# Patient Record
Sex: Female | Born: 1950 | Race: White | Hispanic: No | Marital: Married | Smoking: Never smoker
Health system: Southern US, Community
[De-identification: ages and names within clinical notes are randomized; demographics above are authoritative.]

## PROBLEM LIST (undated history)

## (undated) DIAGNOSIS — E039 Hypothyroidism, unspecified: Secondary | ICD-10-CM

## (undated) DIAGNOSIS — L439 Lichen planus, unspecified: Secondary | ICD-10-CM

## (undated) DIAGNOSIS — K069 Disorder of gingiva and edentulous alveolar ridge, unspecified: Secondary | ICD-10-CM

## (undated) DIAGNOSIS — M1990 Unspecified osteoarthritis, unspecified site: Secondary | ICD-10-CM

## (undated) DIAGNOSIS — I2510 Atherosclerotic heart disease of native coronary artery without angina pectoris: Secondary | ICD-10-CM

## (undated) DIAGNOSIS — I219 Acute myocardial infarction, unspecified: Secondary | ICD-10-CM

## (undated) DIAGNOSIS — R7303 Prediabetes: Secondary | ICD-10-CM

## (undated) DIAGNOSIS — T8859XA Other complications of anesthesia, initial encounter: Secondary | ICD-10-CM

## (undated) DIAGNOSIS — I10 Essential (primary) hypertension: Secondary | ICD-10-CM

## (undated) DIAGNOSIS — I6529 Occlusion and stenosis of unspecified carotid artery: Secondary | ICD-10-CM

## (undated) DIAGNOSIS — E079 Disorder of thyroid, unspecified: Secondary | ICD-10-CM

## (undated) HISTORY — PX: ABDOMINAL HYSTERECTOMY: SHX81

## (undated) HISTORY — PX: CARDIAC CATHETERIZATION: SHX172

## (undated) HISTORY — PX: OVARIAN CYST REMOVAL: SHX89

## (undated) HISTORY — PX: THYROIDECTOMY, PARTIAL: SHX18

## (undated) HISTORY — DX: Lichen planus, unspecified: L43.9

## (undated) HISTORY — PX: CATARACT EXTRACTION, BILATERAL: SHX1313

## (undated) HISTORY — PX: LASIK: SHX215

## (undated) HISTORY — DX: Atherosclerotic heart disease of native coronary artery without angina pectoris: I25.10

## (undated) HISTORY — PX: APPENDECTOMY: SHX54

## (undated) HISTORY — DX: Disorder of gingiva and edentulous alveolar ridge, unspecified: K06.9

## (undated) HISTORY — PX: GUM SURGERY: SHX658

## (undated) HISTORY — PX: ROTATOR CUFF REPAIR W/ DISTAL CLAVICLE EXCISION: SHX2365

## (undated) HISTORY — PX: TUBAL LIGATION: SHX77

## (undated) HISTORY — PX: WRIST SURGERY: SHX841

## (undated) HISTORY — DX: Occlusion and stenosis of unspecified carotid artery: I65.29

## (undated) HISTORY — DX: Disorder of thyroid, unspecified: E07.9

## (undated) HISTORY — PX: KNEE ARTHROSCOPY: SHX127

## (undated) HISTORY — PX: ROTATOR CUFF REPAIR: SHX139

## (undated) HISTORY — PX: BILATERAL OOPHORECTOMY: SHX1221

---

## 1975-07-18 HISTORY — PX: APPENDECTOMY: SHX54

## 1980-07-17 HISTORY — PX: ROTATOR CUFF REPAIR: SHX139

## 1989-07-17 HISTORY — PX: ABDOMINAL HYSTERECTOMY: SHX81

## 1989-07-17 HISTORY — PX: BILATERAL OOPHORECTOMY: SHX1221

## 2007-12-12 HISTORY — PX: THYROID SURGERY: SHX805

## 2017-08-02 LAB — HM MAMMOGRAPHY

## 2017-11-08 HISTORY — PX: KNEE ARTHROSCOPY: SHX127

## 2018-07-05 HISTORY — PX: ROTATOR CUFF REPAIR W/ DISTAL CLAVICLE EXCISION: SHX2365

## 2019-02-11 LAB — HM DEXA SCAN

## 2019-02-13 LAB — HM MAMMOGRAPHY

## 2019-11-05 LAB — HM PAP SMEAR: HM Pap smear: NEGATIVE

## 2020-06-19 ENCOUNTER — Ambulatory Visit
Admission: EM | Admit: 2020-06-19 | Discharge: 2020-06-19 | Disposition: A | Discharge status: Home / Self Care | Payer: Medicare Other

## 2020-06-19 ENCOUNTER — Other Ambulatory Visit: Payer: Self-pay

## 2020-06-19 DIAGNOSIS — J3489 Other specified disorders of nose and nasal sinuses: Secondary | ICD-10-CM

## 2020-06-19 DIAGNOSIS — J019 Acute sinusitis, unspecified: Secondary | ICD-10-CM

## 2020-06-19 DIAGNOSIS — R059 Cough, unspecified: Secondary | ICD-10-CM

## 2020-06-19 MED ORDER — AMOXICILLIN 500 MG PO CAPS: 500.0000 mg | ORAL_CAPSULE | Freq: Three times a day (TID) | ORAL | 0 refills | Status: DC

## 2020-06-19 MED ORDER — CETIRIZINE HCL 10 MG PO TABS: 10.0000 mg | ORAL_TABLET | Freq: Every day | ORAL | 0 refills | Status: DC

## 2020-06-19 MED ORDER — PREDNISONE 20 MG PO TABS: ORAL_TABLET | ORAL | 0 refills | Status: DC

## 2020-06-19 NOTE — ED Provider Notes (Signed)
[place]   MRN: [id number] DOB: 07-19-1950  Subjective:   Samantha Perkins is a 69 y.o. female presenting for 2-week history of persistent sinus pressure and sinus drainage, coughing.  Patient states that symptoms started when she was packing and trying to move from their house and [place], had a lot of exposure to dust.  Denies chest pain, shortness of breath, body aches.  Patient have had both Covid vaccinations and the booster shot.  History of heart disease, hypothyroidism.  No history of heart failure, diabetes.  No history of asthma, smoking history.  No current facility-administered medications for this encounter.  Current Outpatient Medications:  .  aspirin EC 81 MG tablet, Take 81 mg by mouth daily. Swallow whole., Disp: , Rfl:  .  clopidogrel (PLAVIX) 75 MG tablet, Take 75 mg by mouth daily., Disp: , Rfl:  .  estradiol (ESTRACE) 1 MG tablet, Take 1 mg by mouth daily., Disp: , Rfl:  .  levothyroxine (SYNTHROID) 88 MCG tablet, Take 88 mcg by mouth daily before breakfast., Disp: , Rfl:  .  liothyronine (CYTOMEL) 5 MCG tablet, Take 5 mcg by mouth daily., Disp: , Rfl:  .  metoprolol succinate (TOPROL-XL) 25 MG 24 hr tablet, Take 25 mg by mouth daily., Disp: , Rfl:    Allergies  Allergen Reactions  . Cymbalta [Duloxetine Hcl] Rash    History reviewed. No pertinent past medical history.   History reviewed. No pertinent surgical history.  History reviewed. No pertinent family history.  Social History   Tobacco Use  . Smoking status: Never Smoker  . Smokeless tobacco: Never Used  Vaping Use  . Vaping Use: Never used  Substance Use Topics  . Alcohol use: Yes    Comment: occasionally  . Drug use: Never    ROS   Objective:   Vitals: BP (!) 144/75 (BP Location: Left Arm)   Pulse 83   Temp 97.8 F (36.6 C) (Oral)   Resp 20   SpO2 95%   Physical Exam Constitutional:      General: She is not in acute distress.    Appearance: Normal  appearance. She is well-developed. She is not ill-appearing, toxic-appearing or diaphoretic.  HENT:     Head: Normocephalic and atraumatic.     Right Ear: External ear normal.     Left Ear: External ear normal.     Nose: Congestion present.     Mouth/Throat:     Mouth: Mucous membranes are moist.     Pharynx: No oropharyngeal exudate or posterior oropharyngeal erythema.     Comments: Significant postnasal drainage overlying pharynx. Eyes:     General: No scleral icterus.       Right eye: No discharge.        Left eye: No discharge.     Extraocular Movements: Extraocular movements intact.     Conjunctiva/sclera: Conjunctivae normal.     Pupils: Pupils are equal, round, and reactive to light.  Cardiovascular:     Rate and Rhythm: Normal rate and regular rhythm.     Pulses: Normal pulses.     Heart sounds: Normal heart sounds. No murmur heard.  No friction rub. No gallop.   Pulmonary:     Effort: Pulmonary effort is normal. No respiratory distress.     Breath sounds: Normal breath sounds. No stridor. No wheezing, rhonchi or rales.  Skin:    General: Skin is warm and dry.     Findings: No rash.  Neurological:  Mental Status: She is alert and oriented to person, place, and time.  Psychiatric:        Mood and Affect: Mood normal.        Behavior: Behavior normal.        Thought Content: Thought content normal.        Judgment: Judgment normal.     Assessment and Plan :   PDMP not reviewed this encounter.  1. Acute non-recurrent sinusitis, unspecified location   2. Sinus drainage   3. Sinus pain   4. Cough     Will start empiric treatment for sinusitis with amoxicillin.  Patient requested more aggressive management and therefore I was agreeable to using prednisone course especially since patient has been tested for allergies and has significant trouble with this.  Also recommended she start Zyrtec daily especially since she lives here [place] now.  Recommended  supportive care otherwise. Counseled patient on potential for adverse effects with medications prescribed/recommended today, ER and return-to-clinic precautions discussed, patient verbalized understanding.    Jaynee Eagles, [place] 06/19/20 [phone]

## 2020-06-19 NOTE — ED Triage Notes (Signed)
Patient complains of cough and sinus pressure/drainage x 2 weeks and states it is probably due to currently packing and moving from their house in [place] and all the dust exposure. Pt is aox4 and ambulatory and complains of no fever.

## 2020-07-26 ENCOUNTER — Encounter: Payer: Self-pay | Admitting: Family Medicine

## 2020-07-26 ENCOUNTER — Other Ambulatory Visit: Payer: Self-pay

## 2020-07-26 ENCOUNTER — Ambulatory Visit (INDEPENDENT_AMBULATORY_CARE_PROVIDER_SITE_OTHER): Payer: Medicare HMO | Admitting: Family Medicine

## 2020-07-26 DIAGNOSIS — M8580 Other specified disorders of bone density and structure, unspecified site: Secondary | ICD-10-CM | POA: Insufficient documentation

## 2020-07-26 DIAGNOSIS — E041 Nontoxic single thyroid nodule: Secondary | ICD-10-CM | POA: Diagnosis not present

## 2020-07-26 DIAGNOSIS — I252 Old myocardial infarction: Secondary | ICD-10-CM | POA: Insufficient documentation

## 2020-07-26 DIAGNOSIS — E063 Autoimmune thyroiditis: Secondary | ICD-10-CM | POA: Diagnosis not present

## 2020-07-26 DIAGNOSIS — M79672 Pain in left foot: Secondary | ICD-10-CM | POA: Insufficient documentation

## 2020-07-26 DIAGNOSIS — E785 Hyperlipidemia, unspecified: Secondary | ICD-10-CM | POA: Insufficient documentation

## 2020-07-26 DIAGNOSIS — E038 Other specified hypothyroidism: Secondary | ICD-10-CM | POA: Diagnosis not present

## 2020-07-26 DIAGNOSIS — E782 Mixed hyperlipidemia: Secondary | ICD-10-CM | POA: Diagnosis not present

## 2020-07-26 MED ORDER — ESTRADIOL 0.5 MG PO TABS: 0.5000 mg | ORAL_TABLET | Freq: Every day | ORAL | 1 refills | Status: DC

## 2020-07-26 NOTE — Progress Notes (Signed)
Subjective:     Samantha Perkins is a 70 y.o. female presenting for Establish Care and Referral (Podiatry, cardiology (Dr. Audie Box))     HPI   #osteopenia - several fractures on her feet - takes prolia and is due for her upcoming shots - February 14 is when it is due  #hashimoto's thyroiditis  - taking cytomel and synthroid - difficulty sleeping and was waking up so he started the cytomel to see if that would help    Review of Systems   Social History   Tobacco Use  Smoking Status Never Smoker  Smokeless Tobacco Never Used        Objective:    BP Readings from Last 3 Encounters:  07/26/20 112/62  06/19/20 (!) 144/75   Wt Readings from Last 3 Encounters:  07/26/20 200 lb 8 oz (90.9 kg)    BP 112/62   Pulse 69   Temp 97.7 F (36.5 C) (Temporal)   Ht 5' 1.75" (1.568 m)   Wt 200 lb 8 oz (90.9 kg)   SpO2 96%   BMI 36.97 kg/m    Physical Exam Constitutional:      General: She is not in acute distress.    Appearance: She is well-developed. She is not diaphoretic.  HENT:     Right Ear: External ear normal.     Left Ear: External ear normal.     Nose: Nose normal.  Eyes:     Conjunctiva/sclera: Conjunctivae normal.  Cardiovascular:     Rate and Rhythm: Normal rate and regular rhythm.     Heart sounds: No murmur heard.   Pulmonary:     Effort: Pulmonary effort is normal. No respiratory distress.     Breath sounds: Normal breath sounds. No wheezing.  Musculoskeletal:     Cervical back: Neck supple.  Skin:    General: Skin is warm and dry.     Capillary Refill: Capillary refill takes less than 2 seconds.  Neurological:     Mental Status: She is alert. Mental status is at baseline.  Psychiatric:        Mood and Affect: Mood normal.        Behavior: Behavior normal.           Assessment & Plan:   Problem List Items Addressed This Visit      Endocrine   Thyroid nodule    S/p partial thyroidectomy with new nodules. Endo referral       Relevant Orders   TSH   Ambulatory referral to Endocrinology   Hypothyroidism due to Hashimoto's thyroiditis    Currently stable. Started cytomel to help with sleep w/o much improvement. Cont Liothyronine 5 mg and levo 88 mcg. Referral to endocrinology given nodules.       Relevant Orders   TSH   Ambulatory referral to Endocrinology     Musculoskeletal and Integument   Osteopenia    On prolia due to recurrent fractures. Labs today. Cont prolia        Other   History of MI (myocardial infarction)    Cont metoprolol 25 mg, asa 81 mg, and atorvastatin 10 mg      Relevant Orders   Ambulatory referral to Cardiology   Comprehensive metabolic panel   CBC   Hyperlipidemia    Cont lipitor 10 mg      Relevant Medications   atorvastatin (LIPITOR) 10 MG tablet   nitroGLYCERIN (NITROSTAT) 0.4 MG SL tablet   Other Relevant Orders   Ambulatory  referral to Cardiology   Lipid panel   Left foot pain   Relevant Orders   Ambulatory referral to Podiatry       Return in about 6 weeks (around 09/06/2020).  Lesleigh Noe, MD  This visit occurred during the SARS-CoV-2 public health emergency.  Safety protocols were in place, including screening questions prior to the visit, additional usage of staff PPE, and extensive cleaning of exam room while observing appropriate contact time as indicated for disinfecting solutions.

## 2020-07-26 NOTE — Assessment & Plan Note (Signed)
Cont metoprolol 25 mg, asa 81 mg, and atorvastatin 10 mg

## 2020-07-26 NOTE — Assessment & Plan Note (Signed)
Currently stable. Started cytomel to help with sleep w/o much improvement. Cont Liothyronine 5 mg and levo 88 mcg. Referral to endocrinology given nodules.

## 2020-07-26 NOTE — Assessment & Plan Note (Signed)
S/p partial thyroidectomy with new nodules. Endo referral

## 2020-07-26 NOTE — Assessment & Plan Note (Addendum)
On prolia due to recurrent fractures. Labs today. Cont prolia

## 2020-07-26 NOTE — Assessment & Plan Note (Signed)
Cont lipitor 10 mg 

## 2020-07-26 NOTE — Patient Instructions (Addendum)
Estrogen - Decrease dose - new tablet is 0.5 mg - if after 2-3 days miserable - try taking 1.5 tablets (0.75 mg)     #Referral I have placed a referral to a specialist for you. You should receive a phone call from the specialty office. Make sure your voicemail is not full and that if you are able to answer your phone to unknown or new numbers.   It may take up to 2 weeks to hear about the referral. If you do not hear anything in 2 weeks, please call our office and ask to speak with the referral coordinator.   - Endocrinology - podiatry  - cardiology   Please call the location of your choice from the menu below to schedule your Mammogram and/or Bone Density appointment.    [place]   1. [facility]                      Phone:  [phone], [place] [address]                                                             Services: Traditional and 3D Mammogram, Bone Density   2. [facility]                 Phone: [phone] [address], [place] [address]    Service: Bone Density ONLY   *this site does NOT perform mammograms  3. [place]                        Phone:  [phone] [address] [place],  [address]                                            Services:  3D Mammogram and Bone Density    [place]  1. [place] [facility]   Phone:  [phone]   [address]  [place], [place] [address]                                            Services: 3D Mammogram and Bone Density  2. [place] at [facility])  Phone:  [phone]   [address] Room [place], [place] [address]                                              Services:  3D Mammogram and Bone Density

## 2020-07-27 LAB — COMPREHENSIVE METABOLIC PANEL
ALT: 14 U/L (ref 0–35)
AST: 18 U/L (ref 0–37)
Albumin: 4.3 g/dL (ref 3.5–5.2)
Alkaline Phosphatase: 44 U/L (ref 39–117)
BUN: 16 mg/dL (ref 6–23)
CO2: 28 mEq/L (ref 19–32)
Calcium: 9.7 mg/dL (ref 8.4–10.5)
Chloride: 103 mEq/L (ref 96–112)
Creatinine, Ser: 1.01 mg/dL (ref 0.40–1.20)
GFR: 56.94 mL/min — ABNORMAL LOW (ref >60.00)
Glucose, Bld: 93 mg/dL (ref 70–99)
Potassium: 4.8 mEq/L (ref 3.5–5.1)
Sodium: 138 mEq/L (ref 135–145)
Total Bilirubin: 0.3 mg/dL (ref 0.2–1.2)
Total Protein: 6.7 g/dL (ref 6.0–8.3)

## 2020-07-27 LAB — CBC
HCT: 39 % (ref 36.0–46.0)
Hemoglobin: 12.9 g/dL (ref 12.0–15.0)
MCHC: 33.2 g/dL (ref 30.0–36.0)
MCV: 80.6 fl (ref 78.0–100.0)
Platelets: 210 10*3/uL (ref 150.0–400.0)
RBC: 4.83 Mil/uL (ref 3.87–5.11)
RDW: 14.1 % (ref 11.5–15.5)
WBC: 7.2 10*3/uL (ref 4.0–10.5)

## 2020-07-27 LAB — LIPID PANEL
Cholesterol: 165 mg/dL (ref 0–200)
HDL: 59.3 mg/dL (ref >39.00)
NonHDL: 105.4
Total CHOL/HDL Ratio: 3
Triglycerides: 261 mg/dL — ABNORMAL HIGH (ref 0.0–149.0)
VLDL: 52.2 mg/dL — ABNORMAL HIGH (ref 0.0–40.0)

## 2020-07-27 LAB — LDL CHOLESTEROL, DIRECT: Direct LDL: 73 mg/dL

## 2020-07-27 LAB — TSH: TSH: 0.96 u[IU]/mL (ref 0.35–4.50)

## 2020-08-03 ENCOUNTER — Ambulatory Visit: Payer: Medicare HMO | Admitting: Podiatry

## 2020-08-16 ENCOUNTER — Other Ambulatory Visit: Payer: Self-pay

## 2020-08-16 ENCOUNTER — Ambulatory Visit: Payer: Medicare HMO | Admitting: Podiatry

## 2020-08-16 ENCOUNTER — Ambulatory Visit (INDEPENDENT_AMBULATORY_CARE_PROVIDER_SITE_OTHER): Payer: Medicare HMO

## 2020-08-16 ENCOUNTER — Encounter: Payer: Self-pay | Admitting: Podiatry

## 2020-08-16 DIAGNOSIS — S92351A Displaced fracture of fifth metatarsal bone, right foot, initial encounter for closed fracture: Secondary | ICD-10-CM

## 2020-08-16 DIAGNOSIS — M7672 Peroneal tendinitis, left leg: Secondary | ICD-10-CM | POA: Diagnosis not present

## 2020-08-16 DIAGNOSIS — S92352A Displaced fracture of fifth metatarsal bone, left foot, initial encounter for closed fracture: Secondary | ICD-10-CM | POA: Diagnosis not present

## 2020-08-16 NOTE — Progress Notes (Signed)
  Subjective:  Patient ID: Samantha Perkins, female    DOB: 1950/07/24,  MRN: [id number]  Chief Complaint  Patient presents with  . Foot Pain    Left foot pain on the lateral side. PT stated that for the last 5-6 months the pain has gotten worse.     70 y.o. female presents with the above complaint. History confirmed with patient.  This pain initially began in late May 2021.  She has a history of multiple foot fractures and osteopenia.  She takes Prolia therapy and has injections twice a year.  The pain is in the outside of the foot and is worse when walking.  She recently moved and has been on her feet a lot.  Objective:  Physical Exam: warm, good capillary refill, no trophic changes or ulcerative lesions, normal DP and PT pulses and normal sensory exam. Left Foot: Sharp pain on palpation of the fifth metatarsal base, she also has pain in the retromalleolar area of the peroneal tendons.  5 out of 5 strength.  Pain with resisted eversion.  Radiographs: X-ray of the left foot: Age-indeterminate but appears to be acute avulsion fracture of the fifth metatarsal base Assessment:   1. Closed fracture of base of fifth metatarsal bone of right foot, initial encounter   2. Peroneal tendinitis of left lower extremity      Plan:  Patient was evaluated and treated and all questions answered.   Reviewed the x-rays with the patient and discussed etiology and treatment options for what appears to be an avulsion fracture of the fifth metatarsal base as well as peroneal tendinopathy.  I think we should begin with immobilization in a short CAM boot for at least 2 weeks, she can then transition to a supportive Tri-Lock ankle brace, both of which I dispensed today.  I would like to see her back in 1 month.  If she is improving we will begin rehab exercises for peroneal tendinopathy.  Consider physical therapy and MRI if not improving within 6 to 8 weeks.  Return in about 1 month (around 09/13/2020).

## 2020-08-16 NOTE — Patient Instructions (Addendum)
Wear the boot for 2-4 weeks. If the pain is significantly better in 2 weeks, you can transition to wearing the ankle brace. If it still hurts, remain in the boot.      Peroneal Tendinopathy  Peroneal tendinopathy is irritation of the tendons that pass behind your ankle (peroneal tendons). These tendons attach muscles in your foot to a bone on the side of your foot and underneath the arch of your foot. This condition can cause your peroneal tendons to get bigger and swell. What are the causes? This condition may be caused by:  Putting stress on your ankle over and over again (overuse injury).  A sudden injury that puts stress on your tendons, such as an ankle sprain. What increases the risk? You are more likely to develop this condition if you:  Have high arches.  Play sports that involve putting stress on the ankle over and over again. These sports include: ? Running. ? Dancing. ? Soccer. ? Basketball. What are the signs or symptoms? Symptoms of this condition can start suddenly or develop gradually. Symptoms of this condition include:  Pain in the back of the ankle, on the side of the foot, or in the arch of the foot.  Pain that gets worse with activity and better with rest.  Swelling.  Warmth.  Weakness in your foot or ankle. How is this diagnosed? This condition may be diagnosed based on:  Your symptoms.  Your medical history.  A physical exam. During the exam, your health care provider may move your foot and ankle and test the strength of your leg muscles.  Imaging tests, such as: ? X-rays or a CT scan to check for bone injury. ? MRI or ultrasound to check for muscle or tendon injury. How is this treated? This condition may be treated by:  Keeping your body weight off your ankle for several days.  Returning to full activity gradually.  Putting ice on your ankle to reduce swelling.  Taking NSAIDs, such as ibuprofen.  Having medicine injected into your  tendon to reduce swelling.  Wearing a removable boot or brace for ankle support.  Doing range-of-motion exercises and strengthening exercises (physical therapy) when pain and swelling improve. If the condition does not improve with treatment, or if a tendon or muscle is damaged, surgery may be needed. Follow these instructions at home: If you have a boot or brace:  Wear the boot or brace as told by your health care provider. Remove it only as told by your health care provider.  Loosen the boot or brace if your toes tingle, become numb, or turn cold and blue.  Keep the boot or brace clean.  If the boot or brace is not waterproof: ? Do not let it get wet. ? Cover it with a watertight covering when you take a bath or shower. Managing pain, stiffness, and swelling  If directed, put ice on the injured area. ? If you have a removable boot or brace, remove it as told by your health care provider. ? Put ice in a plastic bag. ? Place a towel between your skin and the bag. ? Leave the ice on for 20 minutes, 2-3 times a day.  Move your toes often to reduce stiffness and swelling.  Raise (elevate) your ankle above the level of your heart while you are sitting or lying down.   Activity  Do not do activities that make pain or swelling worse.  Do exercises as told by your health care  provider.  Return to your normal activities as told by your health care provider. Ask your health care provider what activities are safe for you.  Ask your health care provider when it is safe to drive if you have a boot or brace on your foot. General instructions  Take over-the-counter and prescription medicines only as told by your health care provider.  Do not use any products that contain nicotine or tobacco, such as cigarettes, e-cigarettes, and chewing tobacco. These can delay healing. If you need help quitting, ask your health care provider.  Keep all follow-up visits as told by your health care  provider. This is important. How is this prevented?  Wear supportive footwear that is appropriate for your athletic activity.  Avoid athletic activities that cause swelling or pain in your ankle or foot.  See your health care provider if you have pain or swelling that does not improve after a few days of rest.  Stop training if you develop pain or swelling.  If you start a new athletic activity, start gradually to build up your strength, endurance, and flexibility.  Warm up and stretch before being active.  Cool down and stretch after being active. Contact a health care provider if:  Your symptoms get worse.  Your symptoms do not improve in 2-4 weeks.  You develop new, unexplained symptoms. Summary  Peroneal tendinopathy is irritation of the tendons that pass behind your ankle.  This condition is caused by overuse or sudden injury to the peroneal tendon.  Symptoms include pain, swelling, warmth, and weakness in your foot or ankle.  This condition is treated with rest, ice, medicines, physical therapy, and surgery if needed. This information is not intended to replace advice given to you by your health care provider. Make sure you discuss any questions you have with your health care provider. Document Revised: 10/24/2018 Document Reviewed: 08/12/2018 Elsevier Patient Education  2021 [employer].

## 2020-08-17 ENCOUNTER — Telehealth: Payer: Self-pay

## 2020-08-17 NOTE — Telephone Encounter (Signed)
Pt reports she is a new pt and was receiving prolia and thinks she is due for inj.  Last inj in chart was 02/19/20  Next prolia inj due after 08/22/20.   Pt had labs at [facility] on 07/26/20; Ca normal and CrCl 75.44; all labs ok for prolia inj. Need to run benefits for cost to pt and then inquire if ok with PCP to use labs from 07/26/20. If ok then pt will need to be scheduled for inj but if not ok she will need to have labs and then [place].   Advised pt of process and that benefits and if PA was needed can take up to 2 wks. Advised once that info was obtained this office would f/u. Advised if anything is needed before being contacted to call the clinic. Pt verbalized understanding and voiced that she has been treated so nicely since moving here and becoming a  Pt at this clinic.

## 2020-08-18 NOTE — Telephone Encounter (Signed)
Benefits submitted will not know if needs auth until they return. Let me know if one is needed and I will submit.

## 2020-08-20 ENCOUNTER — Encounter: Payer: Self-pay | Admitting: *Deleted

## 2020-09-05 NOTE — Progress Notes (Signed)
Cardiology Office Note:   Date:  09/06/2020  NAME:  Samantha Perkins    MRN: [id number] DOB:  June 14, 1951   PCP:  Lesleigh Noe, MD  Cardiologist:  No primary care provider on file.   Referring MD: Lesleigh Noe, MD   Chief Complaint  Patient presents with  . Coronary Artery Disease   History of Present Illness:   Samantha Perkins is a 70 y.o. female with a hx of CAD who is being seen today for the evaluation of CAD at the request of Lesleigh Noe, MD.  She recently relocated to [place].  She was living in [place].  She is relocated to be closer to family.  Her nephew was Monica Martinez a Hydrographic surveyor in town.  She had a myocardial infarction in 2007.  There was an occluded distal circumflex that was not amendable to PCI.  She has been managed medically since that time.  She underwent a cardiac PET scan in [place] in September 2021 which showed a small fixed perfusion defect with no evidence of ischemia.  LV function is remain normal.  She denies any chest pain or shortness of breath.  She is working on losing weight.  She suffers from osteopenia.  She has a fracture in her left foot.  She may need surgery.  She is remained on aspirin and Plavix since that time.  She does bruise easily.  We discussed stopping her Plavix.  She will remain on aspirin.  We also discussed her most recent lipid profile.  LDL cholesterol not at goal.  Triglycerides not at goal.  She was nonfasting.  We will recheck this today.  She also had a history of PACs and has been on metoprolol.  Denies any palpitations.  Her EKG demonstrates normal sinus rhythm heart rate 66 with nonspecific ST-T changes.  She is a never smoker.  She rarely drinks alcohol.  No drug use reported.  She is a retired Education officer, community.  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  She is married.  She has 2 step daughters.  She seems to be doing well.   She did have carotid artery stenoses bilaterally.  None were significant.  She needs repeat ultrasounds.  Problem List 1. CAD -Small inferior infarct 01/28/2006 with totally occluded distal circumflex was unsuccessful angioplasty [facility]) -Cardiac PET 04/14/2020 ([place]): Large, fixed perfusion defect of the lateral wall, no ischemia, findings consistent with prior MI, EF 59% 2. HLD -T chol 165, HLD 59, LDL 73, TG 261 3. Hypothyroidism  4. Osteopenia 5.  PACs -Treated with metoprolol 6.  Carotid artery disease -Less than 40% right ICA -40 to 59% left ICA (2020)  Past Medical History: Past Medical History:  Diagnosis Date  . Carotid artery occlusion   . Coronary artery disease    Distal LCX MI 2007 in [place]. PCI unable to be performed.   . Thyroid disease     Past Surgical History: Past Surgical History:  Procedure Laterality Date  . ABDOMINAL HYSTERECTOMY    . APPENDECTOMY    . BILATERAL OOPHORECTOMY    . CARDIAC CATHETERIZATION    . CATARACT EXTRACTION, BILATERAL    . GUM SURGERY    . KNEE ARTHROSCOPY    . LASIK    . OVARIAN CYST REMOVAL    . ROTATOR CUFF REPAIR Right   . ROTATOR CUFF REPAIR W/ DISTAL CLAVICLE EXCISION Left   .  THYROIDECTOMY, PARTIAL    . TUBAL LIGATION      Current Medications: Current Meds  Medication Sig  . aspirin EC 81 MG tablet Take 81 mg by mouth daily. Swallow whole.  Marland Kitchen atorvastatin (LIPITOR) 10 MG tablet Take 10 mg by mouth every other day.  Marland Kitchen azelastine (ASTELIN) 0.1 % nasal spray as needed.  . cetirizine (ZYRTEC ALLERGY) 10 MG tablet Take 1 tablet (10 mg total) by mouth daily. (Patient taking differently: Take 10 mg by mouth as needed.)  . denosumab (PROLIA) 60 MG/ML SOSY injection Inject 60 mg into the skin every 6 (six) months.  . desonide (DESOWEN) 0.05 % cream Apply topically as needed.  Marland Kitchen estradiol (ESTRACE) 0.5 MG tablet Take 1 tablet (0.5 mg total) by mouth daily.  . fluocinonide gel (LIDEX) 0.05  % Apply topically once a week.  . levothyroxine (SYNTHROID) 88 MCG tablet Take 88 mcg by mouth daily before breakfast.  . liothyronine (CYTOMEL) 5 MCG tablet Take 5 mcg by mouth daily.  . Melatonin 5 MG CAPS Take 5 mg by mouth at bedtime.  . metoprolol succinate (TOPROL-XL) 25 MG 24 hr tablet Take 25 mg by mouth daily.  . Multiple Vitamins-Minerals (CULTURELLE PROBIOTICS + MULTIV) CHEW Chew by mouth.  . Multiple Vitamins-Minerals (ONE-A-DAY WOMENS 50+ ADVANTAGE PO) Take by mouth daily.  . nitroGLYCERIN (NITROSTAT) 0.4 MG SL tablet Place 0.4 mg under the tongue as needed.  . nystatin cream (MYCOSTATIN) Apply 1 application topically 3 (three) times daily as needed.  . [DISCONTINUED] clopidogrel (PLAVIX) 75 MG tablet Take 75 mg by mouth daily.     Allergies:    Cymbalta [duloxetine hcl], Duloxetine, Latex, and Wound dressing adhesive   Social History: Social History   Socioeconomic History  . Marital status: Married    Spouse name: Hiram Comber  . Number of children: 0  . Years of education: master's degree  . Highest education level: Not on file  Occupational History  . Occupation: retired Pharmacist, hospital  Tobacco Use  . Smoking status: Never Smoker  . Smokeless tobacco: Never Used  Vaping Use  . Vaping Use: Never used  Substance and Sexual Activity  . Alcohol use: Yes    Comment: twice a week, 1 drink  . Drug use: Never  . Sexual activity: Not Currently    Birth control/protection: Surgical  Other Topics Concern  . Not on file  Social History Narrative   07/26/20   From: [place], moved to be near children   Living: with husband, Hiram Comber [phone])   Work: retired - Lexicographer      Family: step daughter - Product manager - 2 step granddaughters - in the area      Enjoys: Environmental education officer, explore new places, reading, volunteering       Exercise: not currently, joining the [employer]   Diet: tries to eat healthy, not as good as she did before      Safety    Seat belts: Yes    Guns: No   Safe in relationships: Yes    Social Determinants of Radio broadcast assistant Strain: Not on file  Food Insecurity: Not on file  Transportation Needs: Not on file  Physical Activity: Not on file  Stress: Not on file  Social Connections: Not on file     Family History: The patient's family history includes Aneurysm in her mother; Arthritis in her father and sister; Bladder Cancer in her sister; Bradycardia in her sister; COPD in her  father; Cataracts in her sister; Colon cancer in her maternal uncle; Crohn's disease in her sister; Diabetes in her father, maternal grandmother, and sister; Fibroids in her sister; Heart disease in her father; Heart failure in her mother; Heart murmur in her father; Hypertension in her father and sister; Hypothyroidism in her mother; Irritable bowel syndrome in her sister; Liver cancer in her sister; Lung cancer in her mother and sister; Mitral valve prolapse in her mother, sister, and sister; Osteopenia in her sister; Pancreatic cancer in her maternal uncle; Stroke in her mother and sister.  ROS:   All other ROS reviewed and negative. Pertinent positives noted in the HPI.     EKGs/Labs/Other Studies Reviewed:   The following studies were personally reviewed by me today:  EKG:  EKG is ordered today.  The ekg ordered today demonstrates normal sinus rhythm heart rate 66, nonspecific ST-T changes noted, and was personally reviewed by me.   Recent Labs: 07/26/2020: ALT 14; BUN 16; Creatinine, Ser 1.01; Hemoglobin 12.9; Platelets 210.0; Potassium 4.8; Sodium 138; TSH 0.96   Recent Lipid Panel    Component Value Date/Time   CHOL 165 07/26/2020 1610   TRIG 261.0 (H) 07/26/2020 1610   HDL 59.30 07/26/2020 1610   CHOLHDL 3 07/26/2020 1610   VLDL 52.2 (H) 07/26/2020 1610   LDLDIRECT 73.0 07/26/2020 1610    Physical Exam:   VS:  BP 116/70   Pulse 66   Ht 5\' 3"  (1.6 m)   Wt 200 lb (90.7 kg)   SpO2 99%   BMI 35.43 kg/m     Wt Readings from Last 3 Encounters:  09/06/20 200 lb (90.7 kg)  07/26/20 200 lb 8 oz (90.9 kg)    General: Well nourished, well developed, in no acute distress Head: Atraumatic, normal size  Eyes: PEERLA, EOMI  Neck: Supple, no JVD Endocrine: No thryomegaly Cardiac: Normal S1, S2; RRR; no murmurs, rubs, or gallops Lungs: Clear to auscultation bilaterally, no wheezing, rhonchi or rales  Abd: Soft, nontender, no hepatomegaly  Ext: No edema, pulses 2+ Musculoskeletal: No deformities, BUE and BLE strength normal and equal Skin: Warm and dry, no rashes   Neuro: Alert and oriented to person, place, time, and situation, CNII-XII grossly intact, no focal deficits  Psych: Normal mood and affect   ASSESSMENT:   Samantha Perkins is a 70 y.o. female who presents for the following: 1. Coronary artery disease involving native coronary artery of native heart without angina pectoris   2. Mixed hyperlipidemia   3. Bilateral carotid artery stenosis     PLAN:   1. Coronary artery disease involving native coronary artery of native heart without angina pectoris 2. Mixed hyperlipidemia -Small inferior infarct 01/28/2006 with totally occluded distal circumflex was unsuccessful angioplasty [facility]) -Cardiac PET 04/14/2020 ([place]): Large, fixed perfusion defect of the lateral wall, no ischemia, findings consistent with prior MI, EF 59% -No symptoms of chest pain.  No shortness of breath.  Doing well.  We discussed increasing exercise activity level.  She will do this.  She is remain on aspirin and Plavix.  She can stop Plavix.  Aspirin only is fine. -Most recent LDL cholesterol not at goal.  She was nonfasting.  Recheck lipid profile today.  Continue Lipitor 10 mg daily.  We will uptitrate based on results of her fasting profile. -BP stable. -I would recommend she lose weight.  She will work on this. -No need for echocardiogram.  EKG unremarkable.  We will see her yearly.  3.  Bilateral carotid artery stenosis -Less than 40% right ICA -40 to 59% left ICA (2020) -Needs repeat carotid ultrasounds.  Continue aspirin and statin.  Further titration of statin pending lipid profile today.  Disposition: Return in about 1 year (around 09/06/2021).  Medication Adjustments/Labs and Tests Ordered: Current medicines are reviewed at length with the patient today.  Concerns regarding medicines are outlined above.  Orders Placed This Encounter  Procedures  . Lipid panel  . LDL cholesterol, direct  . EKG 12-Lead  . VAS US CAROTID   No orders of the defined types were placed in this encounter.   Patient Instructions  Medication Instructions:  Stop Plavix *If you need a refill on your cardiac medications before your next appointment, please call your pharmacy*   Lab Work: LIPID today  If you have labs (blood work) drawn today and your tests are completely normal, you will receive your results only by: Marland Kitchen MyChart Message (if you have MyChart) OR . A paper copy in the mail If you have any lab test that is abnormal or we need to change your treatment, we will call you to review the results.  Testing:  Your physician has requested that you have a carotid duplex. This test is an ultrasound of the carotid arteries in your neck. It looks at blood flow through these arteries that supply the brain with blood. Allow one hour for this exam. There are no restrictions or special instructions.   Follow-Up: At [facility], you and your health needs are our priority.  As part of our continuing mission to provide you with exceptional heart care, we have created designated Provider Care Teams.  These Care Teams include your primary Cardiologist (physician) and Advanced Practice Providers (APPs -  Physician Assistants and Nurse Practitioners) who all work together to provide you with the care you need, when you need it.  We recommend signing up for the patient portal called "MyChart".   Sign up information is provided on this After Visit Summary.  MyChart is used to connect with patients for Virtual Visits (Telemedicine).  Patients are able to view lab/test results, encounter notes, upcoming appointments, etc.  Non-urgent messages can be sent to your provider as well.   To learn more about what you can do with MyChart, go to [email or web address].    Your next appointment:   12 month(s)  The format for your next appointment:   In Person  Provider:   Eleonore Chiquito, MD      Signed, Addison Naegeli. Audie Box, [place]  [address], [place], [place] [address] [phone]  09/06/2020 9:51 AM

## 2020-09-06 ENCOUNTER — Encounter: Payer: Self-pay | Admitting: Cardiovascular Disease

## 2020-09-06 ENCOUNTER — Other Ambulatory Visit: Payer: Self-pay

## 2020-09-06 ENCOUNTER — Ambulatory Visit: Payer: Medicare HMO | Admitting: Cardiovascular Disease

## 2020-09-06 VITALS — BP 116/70 | HR 66 | Ht 63.0 in | Wt 200.0 lb

## 2020-09-06 DIAGNOSIS — E782 Mixed hyperlipidemia: Secondary | ICD-10-CM

## 2020-09-06 DIAGNOSIS — I6523 Occlusion and stenosis of bilateral carotid arteries: Secondary | ICD-10-CM

## 2020-09-06 DIAGNOSIS — I2510 Atherosclerotic heart disease of native coronary artery without angina pectoris: Secondary | ICD-10-CM | POA: Diagnosis not present

## 2020-09-06 LAB — LIPID PANEL
Chol/HDL Ratio: 2.9 ratio (ref 0.0–4.4)
Cholesterol, Total: 184 mg/dL (ref 100–199)
HDL: 64 mg/dL (ref >39)
LDL Chol Calc (NIH): 91 mg/dL (ref 0–99)
Triglycerides: 170 mg/dL — ABNORMAL HIGH (ref 0–149)
VLDL Cholesterol Cal: 29 mg/dL (ref 5–40)

## 2020-09-06 LAB — LDL CHOLESTEROL, DIRECT: LDL Direct: 91 mg/dL (ref 0–99)

## 2020-09-06 NOTE — Patient Instructions (Signed)
Medication Instructions:  Stop Plavix *If you need a refill on your cardiac medications before your next appointment, please call your pharmacy*   Lab Work: LIPID today  If you have labs (blood work) drawn today and your tests are completely normal, you will receive your results only by: Marland Kitchen MyChart Message (if you have MyChart) OR . A paper copy in the mail If you have any lab test that is abnormal or we need to change your treatment, we will call you to review the results.  Testing:  Your physician has requested that you have a carotid duplex. This test is an ultrasound of the carotid arteries in your neck. It looks at blood flow through these arteries that supply the brain with blood. Allow one hour for this exam. There are no restrictions or special instructions.   Follow-Up: At [facility], you and your health needs are our priority.  As part of our continuing mission to provide you with exceptional heart care, we have created designated Provider Care Teams.  These Care Teams include your primary Cardiologist (physician) and Advanced Practice Providers (APPs -  Physician Assistants and Nurse Practitioners) who all work together to provide you with the care you need, when you need it.  We recommend signing up for the patient portal called "MyChart".  Sign up information is provided on this After Visit Summary.  MyChart is used to connect with patients for Virtual Visits (Telemedicine).  Patients are able to view lab/test results, encounter notes, upcoming appointments, etc.  Non-urgent messages can be sent to your provider as well.   To learn more about what you can do with MyChart, go to [email or web address].    Your next appointment:   12 month(s)  The format for your next appointment:   In Person  Provider:   Eleonore Chiquito, MD

## 2020-09-07 ENCOUNTER — Other Ambulatory Visit: Payer: Self-pay

## 2020-09-07 DIAGNOSIS — E782 Mixed hyperlipidemia: Secondary | ICD-10-CM

## 2020-09-07 MED ORDER — ATORVASTATIN CALCIUM 40 MG PO TABS
40.0000 mg | ORAL_TABLET | ORAL | 1 refills | Status: DC
Start: 2020-09-07 — End: 2020-09-08

## 2020-09-08 ENCOUNTER — Other Ambulatory Visit: Payer: Self-pay

## 2020-09-08 ENCOUNTER — Ambulatory Visit (HOSPITAL_COMMUNITY)
Admission: RE | Admit: 2020-09-08 | Discharge: 2020-09-08 | Disposition: A | Discharge status: Home / Self Care | Payer: Medicare HMO | Source: Ambulatory Visit | Attending: Cardiovascular Disease | Admitting: Cardiovascular Disease

## 2020-09-08 ENCOUNTER — Other Ambulatory Visit: Payer: Self-pay | Admitting: *Deleted

## 2020-09-08 DIAGNOSIS — I6523 Occlusion and stenosis of bilateral carotid arteries: Secondary | ICD-10-CM | POA: Diagnosis not present

## 2020-09-08 MED ORDER — ATORVASTATIN CALCIUM 40 MG PO TABS
40.0000 mg | ORAL_TABLET | Freq: Every day | ORAL | 1 refills | Status: DC
Start: 2020-09-08 — End: 2020-11-16

## 2020-09-13 ENCOUNTER — Other Ambulatory Visit: Payer: Self-pay

## 2020-09-13 ENCOUNTER — Encounter: Payer: Self-pay | Admitting: Podiatry

## 2020-09-13 ENCOUNTER — Ambulatory Visit: Payer: Medicare HMO | Admitting: Podiatry

## 2020-09-13 DIAGNOSIS — S92352A Displaced fracture of fifth metatarsal bone, left foot, initial encounter for closed fracture: Secondary | ICD-10-CM

## 2020-09-13 DIAGNOSIS — M7672 Peroneal tendinitis, left leg: Secondary | ICD-10-CM | POA: Diagnosis not present

## 2020-09-13 NOTE — Progress Notes (Signed)
  Subjective:  Patient ID: Samantha Perkins, female    DOB: February 20, 1951,  MRN: [id number]  Chief Complaint  Patient presents with  . Foot Pain    Left foot pain. Pt stated that she is still having pain with her foot. She stated that if she wears the boot it helps but she still has some pain and the brace does not help her at all.    70 y.o. female returns with the above complaint. History confirmed with patient. Foot feels well same. She is wearing the boot it seems helped some but is still having with the same pain.  Objective:  Physical Exam: warm, good capillary refill, no trophic changes or ulcerative lesions, normal DP and PT pulses and normal sensory exam. Left Foot: Sharp pain on palpation of the fifth metatarsal base, she also has pain in the retromalleolar area of the peroneal tendons.  5 out of 5 strength.  Pain with resisted eversion.  Radiographs: X-ray of the left foot: Age-indeterminate but appears to be acute avulsion fracture of the fifth metatarsal base Assessment:   1. Closed fracture of base of fifth metatarsal bone of left foot, initial encounter   2. Peroneal tendinitis of left lower extremity      Plan:  Patient was evaluated and treated and all questions answered.  Today she has not had much improvement. I recommended she continue wearing the boot for now. Has been going on for quite some time since May 2021. I think she would benefit from physical therapy. A prescription was written for [facility] and she will schedule appointment. I also think MRI of the ankle would be beneficial to evaluate for any tears within the tendon. Also discussed further intervention beyond this including physical therapy as well as possible surgical intervention if there is a tear or other pathology noted on the MRI.  No follow-ups on file.

## 2020-09-14 ENCOUNTER — Telehealth: Payer: Self-pay | Admitting: Podiatry

## 2020-09-14 NOTE — Telephone Encounter (Signed)
Patient would like to know if she should have MRI or start Physical Therapy tomorrow (already scheduled), Please Advise

## 2020-09-15 NOTE — Telephone Encounter (Signed)
She should do both (if there's a scheduling conflict, have the MRI done as a priority over the PT, can start that next week). Thanks!

## 2020-09-15 NOTE — Telephone Encounter (Signed)
Patient is aware, thanks

## 2020-09-16 NOTE — Progress Notes (Signed)
Name: Samantha Perkins  MRN/ DOB: [id number], Dec 24, 1950    Age/ Sex: 70 y.o., female    PCP: Lynnda Child, MD   Reason for Endocrinology Evaluation: Hashimoto's Thyroiditis and MNG     Date of Initial Endocrinology Evaluation: 09/17/2020     HPI: Samantha Perkins is a 70 y.o. female with a past medical history of Hashimoto's thyroiditis, Osteopenia, Dyslipidemia and CAD . The patient presented for initial endocrinology clinic visit on 09/17/2020 for consultative assistance with her Hashimoto's Thyroiditis and nodules .     Moved from [place]  She was diagnosed with hypothyroidism in 2008 S/P left Lobectomy in 2009 for benign thyroid  Nodule nodule , was started on LT-4 replacement in 2009 A few years later Liothyronine was added due to sleep issues   Last thyroid ultrasound 03/2020, no new nodules per pt .   She is having hot flashes  Denies constipation or diarrhea  Has been irritable lately  Denies local neck symptoms    Synthroid 88 mcg  Cytomel 5 mcg daily   Has hx of several stress fractures with osteopenia for years. She has been on Actonel and Boniva , as of recent has been on Prolia ~ 3 yrs now  She is on calcium 600 mg and Vitamin D through [facility]   Of note the pt has MI in 2007, cardiac cath was unsucceful - follows with Dr. Scharlene Gloss     HISTORY:  Past Medical History:  Past Medical History:  Diagnosis Date  . Carotid artery occlusion   . Coronary artery disease    Distal LCX MI 2007 in [place]. PCI unable to be performed.   . Thyroid disease    Past Surgical History:  Past Surgical History:  Procedure Laterality Date  . ABDOMINAL HYSTERECTOMY    . APPENDECTOMY    . BILATERAL OOPHORECTOMY    . CARDIAC CATHETERIZATION    . CATARACT EXTRACTION, BILATERAL    . GUM SURGERY    . KNEE ARTHROSCOPY    . LASIK    . OVARIAN CYST REMOVAL    . ROTATOR CUFF REPAIR Right   . ROTATOR CUFF REPAIR W/ DISTAL CLAVICLE EXCISION Left   . THYROID SURGERY   12/12/2007  . THYROIDECTOMY, PARTIAL    . TUBAL LIGATION      Social History:  reports that she has never smoked. She has never used smokeless tobacco. She reports current alcohol use. She reports that she does not use drugs. Family History: family history includes Aneurysm in her mother; Arthritis in her father and sister; Bladder Cancer in her sister; Bradycardia in her sister; COPD in her father; Cataracts in her sister; Colon cancer in her maternal uncle; Crohn's disease in her sister; Diabetes in her father, maternal grandmother, and sister; Fibroids in her sister; Heart disease in her father; Heart failure in her mother; Heart murmur in her father; Hypertension in her father and sister; Hypothyroidism in her mother; Irritable bowel syndrome in her sister; Liver cancer in her sister; Lung cancer in her mother and sister; Mitral valve prolapse in her mother, sister, and sister; Osteopenia in her sister; Pancreatic cancer in her maternal uncle; Stroke in her mother and sister.   HOME MEDICATIONS: Allergies as of 09/17/2020      Reactions   Cymbalta [duloxetine Hcl] Rash   Duloxetine    Latex    Other reaction(s): Blisters   Wound Dressing Adhesive Other (See Comments)      Medication List  Accurate as of September 17, 2020  8:27 AM. If you have any questions, ask your nurse or doctor.        aspirin EC 81 MG tablet Take 81 mg by mouth daily. Swallow whole.   atorvastatin 40 MG tablet Commonly known as: LIPITOR Take 1 tablet (40 mg total) by mouth daily.   azelastine 0.1 % nasal spray Commonly known as: ASTELIN as needed.   cetirizine 10 MG tablet Commonly known as: ZyrTEC Allergy Take 1 tablet (10 mg total) by mouth daily. What changed:  when to take this reasons to take this   denosumab 60 MG/ML Sosy injection Commonly known as: PROLIA Inject 60 mg into the skin every 6 (six) months.   desonide 0.05 % cream Commonly known as: DESOWEN Apply topically as needed.    estradiol 0.5 MG tablet Commonly known as: ESTRACE Take 1 tablet (0.5 mg total) by mouth daily.   fluocinonide gel 0.05 % Commonly known as: LIDEX Apply topically once a week.   levothyroxine 88 MCG tablet Commonly known as: SYNTHROID Take 88 mcg by mouth daily before breakfast.   liothyronine 5 MCG tablet Commonly known as: CYTOMEL Take 5 mcg by mouth daily.   Melatonin 5 MG Caps Take 5 mg by mouth at bedtime.   metoprolol succinate 25 MG 24 hr tablet Commonly known as: TOPROL-XL Take 25 mg by mouth daily.   nitroGLYCERIN 0.4 MG SL tablet Commonly known as: NITROSTAT Place 0.4 mg under the tongue as needed.   nystatin cream Commonly known as: MYCOSTATIN Apply 1 application topically 3 (three) times daily as needed.   ONE-A-DAY WOMENS 50+ ADVANTAGE PO Take by mouth daily.   Culturelle Probiotics + Multiv Chew Chew by mouth.         REVIEW OF SYSTEMS: A comprehensive ROS was conducted with the patient and is negative except as per HPI and below:  ROS     OBJECTIVE:  VS: BP 126/80   Pulse 65   Ht 5\' 3"  (1.6 m)   Wt 207 lb 4 oz (94 kg)   SpO2 98%   BMI 36.71 kg/m    Wt Readings from Last 3 Encounters:  09/17/20 207 lb 4 oz (94 kg)  09/06/20 200 lb (90.7 kg)  07/26/20 200 lb 8 oz (90.9 kg)     EXAM: General: Pt appears well and is in NAD  Eyes: External eye exam normal without stare, lid lag or exophthalmos.  EOM intact.   Neck: General: Supple without adenopathy. Thyroid: No goiter or nodules appreciated.   Lungs: Clear with good BS bilat with no rales, rhonchi, or wheezes  Heart: Auscultation: RRR.  Abdomen: Normoactive bowel sounds, soft, nontender, without masses or organomegaly palpable  Extremities:  BL LE: No pretibial edema normal on the right, left leg Boot in place   Skin: Hair: Texture and amount normal with gender appropriate distribution Skin Inspection: No rashes Skin Palpation: Skin temperature, texture, and thickness normal to  palpation  Neuro: Cranial nerves: II - XII grossly intact  Motor: Normal strength throughout DTRs: 2+ and symmetric in UE without delay in relaxation phase  Mental Status: Judgment, insight: Intact Orientation: Oriented to time, place, and person Mood and affect: No depression, anxiety, or agitation     DATA REVIEWED: 07/26/2020 TSH 0.960 uIU/mL     ASSESSMENT/PLAN/RECOMMENDATIONS:   Hashimoto's Thyroiditis:   - Other then hot flashes that she attributes to decreasing estrogen she has no thyroid related symptoms - - Pt educated extensively on  the correct way to take levothyroxine (first thing in the morning with water, 30 minutes before eating or taking other medications). - Pt encouraged to double dose the following day if she were to miss a dose given long half-life of levothyroxine. - She is on board with stopping Liothyronine in the futre - Given her low-normal TSH and her CAD hx as well as clinical osteoporosis, I would prefer her TSh to be between 2-3 uIU/Ml  - Will reduce Synthroid as below    Medications : - Decrease Synthroid 88 mcg , half a tablet on Sundays and 1 tablet the rest  Of the Week  - Continue Liothyronine 5 mg daily   2. S/P left Lobectomy :   - S/P left lobectomy in 2009 secondary to Benign reasons - Per pt she has been getting annual ultrasounds, these records not available.  - Will repeat ultrasound in 03/2021 but if no evidence of nodules present, will stop serial ultrasounds.     3. Clinical Osteoporosis :   - Despite low bone density on DXA scan, but she has recurrent fragility fractures. She has been on Actonel and Boniva in the past, Currently on Prolia, she will be receiving this through her PCP  - I have encouraged her to increase calcium to 1200 mg daily  - I have also advised her to make sure she is on at least 1000 iu daily of Vitamin D3 - Reviewed her labs which show normal calcium, and Alk. Phos    F/U in 4 months  Labs in 8 weeks       Signed electronically by: Lyndle Herrlich, MD  [facility] Endocrinology  [facility] [address] [place] [place], [place] [address] Phone: [phone] FAX: [phone]   CC: Lynnda Child, MD [address] [place] [address] Phone: [phone] Fax: [phone]   Return to Endocrinology clinic as below: Future Appointments  Date Time Provider Department Center  09/30/2020  7:40 AM [facility] MR 2 [facility]  10/11/2020  9:45 AM Edwin Cap, DPM [facility]  11/16/2020  9:20 AM O'Neal, Ronnald Ramp, MD [facility]

## 2020-09-17 ENCOUNTER — Encounter: Payer: Self-pay | Admitting: Internal Medicine

## 2020-09-17 ENCOUNTER — Ambulatory Visit: Payer: Medicare HMO | Admitting: Internal Medicine

## 2020-09-17 ENCOUNTER — Other Ambulatory Visit: Payer: Self-pay

## 2020-09-17 VITALS — BP 126/80 | HR 65 | Ht 63.0 in | Wt 207.2 lb

## 2020-09-17 DIAGNOSIS — M8000XA Age-related osteoporosis with current pathological fracture, unspecified site, initial encounter for fracture: Secondary | ICD-10-CM | POA: Insufficient documentation

## 2020-09-17 DIAGNOSIS — E063 Autoimmune thyroiditis: Secondary | ICD-10-CM

## 2020-09-17 DIAGNOSIS — Z9009 Acquired absence of other part of head and neck: Secondary | ICD-10-CM | POA: Insufficient documentation

## 2020-09-17 DIAGNOSIS — M8000XS Age-related osteoporosis with current pathological fracture, unspecified site, sequela: Secondary | ICD-10-CM | POA: Diagnosis not present

## 2020-09-17 DIAGNOSIS — E038 Other specified hypothyroidism: Secondary | ICD-10-CM

## 2020-09-17 NOTE — Patient Instructions (Addendum)
-   Synthroid 88 mcg, half a tablet on Sundays and 1 tablet the rest of the week - Continue Liothyronine 5 mg daily   You are on levothyroxine - which is your thyroid hormone supplement. You MUST take this consistently.  You should take this first thing in the morning on an empty stomach with water. You should not take it with other medications. Wait 43min to 1hr prior to eating. If you are taking any vitamins - please take these in the evening.   If you miss a dose, please take your missed dose the following day (double the dose for that day). You should have a pill box for ONLY levothyroxine on your bedside table to help you remember to take your medications.

## 2020-09-22 NOTE — Telephone Encounter (Signed)
Benefits ran and patient will have to pay $275 OOP. Patient stated that she spoke with her insurance company and she can get it cheaper through pharmacy. Per Lavina Hamman, [facility] pharmacy benefits do not need to be submitted. Medication can be sent to pharmacy. Please advise.

## 2020-09-23 MED ORDER — DENOSUMAB 60 MG/ML ~~LOC~~ SOSY: 60.0000 mg | PREFILLED_SYRINGE | Freq: Once | SUBCUTANEOUS | 0 refills | Status: AC

## 2020-09-23 NOTE — Addendum Note (Signed)
Addended by: Randall An on: 09/23/2020 05:20 PM   Modules accepted: Orders

## 2020-09-23 NOTE — Telephone Encounter (Signed)
That should be fine. 

## 2020-09-23 NOTE — Telephone Encounter (Signed)
Pt would like to be able to use labs from 07/26/20. Ca normal and CrCl is 78.01  Per Dr. Dortha Schwalbe labs from 07/26/20 are ok.   Contacted pt and advised labs could be used. She said she talked to [facility] and they said it would cost $95 and would take 1-2 days to get in. Advised she would pick up and keep refrigerated until [place]. Pt verbalized understanding.     Sent in script

## 2020-09-23 NOTE — Telephone Encounter (Signed)
Pt would like to be able to use labs from 07/26/20. Ca normal and CrCl is 78.01

## 2020-09-28 ENCOUNTER — Other Ambulatory Visit: Payer: Self-pay

## 2020-09-28 ENCOUNTER — Ambulatory Visit (INDEPENDENT_AMBULATORY_CARE_PROVIDER_SITE_OTHER): Payer: Medicare HMO | Admitting: *Deleted

## 2020-09-28 DIAGNOSIS — M8000XS Age-related osteoporosis with current pathological fracture, unspecified site, sequela: Secondary | ICD-10-CM

## 2020-09-28 DIAGNOSIS — M8580 Other specified disorders of bone density and structure, unspecified site: Secondary | ICD-10-CM | POA: Diagnosis not present

## 2020-09-28 MED ORDER — DENOSUMAB 60 MG/ML ~~LOC~~ SOSY
60.0000 mg | PREFILLED_SYRINGE | Freq: Once | SUBCUTANEOUS | Status: AC
  Administered 2020-09-28: 60 mg via SUBCUTANEOUS

## 2020-09-28 NOTE — Progress Notes (Signed)
Per orders of Dr. Einar Pheasant, injection of Prolia given by Tammi Sou. Patient tolerated injection well.

## 2020-09-29 ENCOUNTER — Ambulatory Visit: Payer: Medicare HMO

## 2020-09-30 ENCOUNTER — Other Ambulatory Visit: Payer: Self-pay

## 2020-09-30 ENCOUNTER — Ambulatory Visit
Admission: RE | Admit: 2020-09-30 | Discharge: 2020-09-30 | Disposition: A | Discharge status: Home / Self Care | Payer: Medicare HMO | Source: Ambulatory Visit | Attending: Podiatry | Admitting: Podiatry

## 2020-09-30 DIAGNOSIS — M7672 Peroneal tendinitis, left leg: Secondary | ICD-10-CM

## 2020-09-30 DIAGNOSIS — M2142 Flat foot [pes planus] (acquired), left foot: Secondary | ICD-10-CM | POA: Diagnosis not present

## 2020-09-30 DIAGNOSIS — S92352A Displaced fracture of fifth metatarsal bone, left foot, initial encounter for closed fracture: Secondary | ICD-10-CM

## 2020-09-30 DIAGNOSIS — R600 Localized edema: Secondary | ICD-10-CM | POA: Diagnosis not present

## 2020-10-01 ENCOUNTER — Telehealth: Payer: Self-pay | Admitting: Podiatry

## 2020-10-01 NOTE — Telephone Encounter (Signed)
Yes she should do both, she may try short distances around the house without the boot if she is not having pain. I will see her on the 28th and re-evaluate. Thanks!

## 2020-10-01 NOTE — Telephone Encounter (Signed)
Pt called and stated she received the MRI results, pt wanted to know if she should continue with her boot and start physical therapy

## 2020-10-04 NOTE — Telephone Encounter (Signed)
Spoke to patient

## 2020-10-11 ENCOUNTER — Ambulatory Visit: Payer: Medicare HMO | Admitting: Podiatry

## 2020-10-11 ENCOUNTER — Encounter: Payer: Self-pay | Admitting: Podiatry

## 2020-10-11 ENCOUNTER — Other Ambulatory Visit: Payer: Self-pay

## 2020-10-11 DIAGNOSIS — M7672 Peroneal tendinitis, left leg: Secondary | ICD-10-CM

## 2020-10-11 DIAGNOSIS — S92352D Displaced fracture of fifth metatarsal bone, left foot, subsequent encounter for fracture with routine healing: Secondary | ICD-10-CM | POA: Diagnosis not present

## 2020-10-11 NOTE — Progress Notes (Signed)
  Subjective:  Patient ID: Samantha Perkins, female    DOB: 05-07-1951,  MRN: [id number]  Chief Complaint  Patient presents with  . Fracture    PT stated that her foot is a little better but not great she does get pain if she walks on it a lot     70 y.o. female returns with the above complaint. History confirmed with patient.  Overall has not changed much.  She completed the MRI  Objective:  Physical Exam: warm, good capillary refill, no trophic changes or ulcerative lesions, normal DP and PT pulses and normal sensory exam. Left Foot: Sharp pain on palpation of the fifth metatarsal base, today she does not have any pain in the retromalleolar area of the peroneal tendons.  5 out of 5 strength.  Minimal pain with resisted eversion.  Radiographs: X-ray of the left foot: Age-indeterminate but appears to be acute avulsion fracture of the fifth metatarsal base  Study Result  Narrative & Impression  CLINICAL DATA:  Left ankle pain  EXAM: MRI OF THE LEFT ANKLE WITHOUT CONTRAST  TECHNIQUE: Multiplanar, multisequence MR imaging of the ankle was performed. No intravenous contrast was administered.  COMPARISON:  None.  FINDINGS: TENDONS  Peroneal: Intact peroneus longus and peroneus brevis tendons.  Posteromedial: Intact tibialis posterior, flexor hallucis longus and flexor digitorum longus tendons.  Anterior: Intact tibialis anterior, extensor hallucis longus and extensor digitorum longus tendons.  Achilles: Intact.  Plantar Fascia: Intact.  LIGAMENTS  Lateral: The anterior and posterior tibiofibular ligaments are intact. The anterior and posterior talofibular ligaments are intact. Intact calcaneofibular ligament.  Medial: Deltoid ligament and spring ligament complex intact.  CARTILAGE  Ankle Joint: No joint effusion or chondral defect.  Subtalar Joints/Sinus Tarsi: No joint effusion or chondral defect. Preservation of the anatomic fat within the sinus  tarsi.  Bones: Pes planus alignment. Minimal residual marrow edema at site of tiny nondisplaced cortical avulsion at the fifth metatarsal base (series 4, image 27). Elsewhere, no evidence of fracture. No dislocation. Bidirectional calcaneal enthesophytes. No significant arthropathy. No joint effusions. No suspicious bone lesion.  Other: No fluid collections.  IMPRESSION: 1. Minimal residual marrow edema at site of subacute fifth metatarsal base avulsion fracture. 2. Pes planus alignment. 3. Intact tendons and ligaments of the ankle. Specifically, no peroneal tendinopathy, tear, or tenosynovitis.   Electronically Signed   By: Davina Poke D.O.   On: 09/30/2020 11:45    Assessment:   1. Peroneal tendinitis of left lower extremity   2. Closed fracture of base of fifth metatarsal bone of left foot with routine healing      Plan:  Patient was evaluated and treated and all questions answered.  -MRI findings and clinical exam findings reviewed with patient -Continue boot for now and when doing longer activities, she can come out of this for therapy and exercise -She begins therapy tomorrow think this will help her quite a bit -If not improving in 2 to 3 months may have to consider excision of the fracture fragment but I expect this to improve quite a bit for her.  Return in about 8 weeks (around 12/06/2020) for re-check peroneal tendinitis.

## 2020-10-12 ENCOUNTER — Telehealth: Payer: Self-pay | Admitting: Family Medicine

## 2020-10-12 DIAGNOSIS — M799 Soft tissue disorder, unspecified: Secondary | ICD-10-CM | POA: Diagnosis not present

## 2020-10-12 DIAGNOSIS — M6281 Muscle weakness (generalized): Secondary | ICD-10-CM | POA: Diagnosis not present

## 2020-10-12 DIAGNOSIS — R262 Difficulty in walking, not elsewhere classified: Secondary | ICD-10-CM | POA: Diagnosis not present

## 2020-10-12 DIAGNOSIS — M25572 Pain in left ankle and joints of left foot: Secondary | ICD-10-CM | POA: Diagnosis not present

## 2020-10-12 NOTE — Telephone Encounter (Signed)
Dr. Verda Cumins message forwarded to pt in a mychart message.

## 2020-10-12 NOTE — Telephone Encounter (Signed)
During our last visit she was decreasing the dose. If she is tolerating the lower dose she can try stopping and monitoring symptoms  I also mentioned following up in 6 weeks. If she is having difficulty stopping she can schedule an office to discuss

## 2020-10-12 NOTE — Telephone Encounter (Signed)
Samantha Perkins wanted to know if she needed to still take the Estradiol, she is still having hot flashes but not as bad.  Please advise

## 2020-10-13 DIAGNOSIS — R262 Difficulty in walking, not elsewhere classified: Secondary | ICD-10-CM | POA: Diagnosis not present

## 2020-10-13 DIAGNOSIS — M25572 Pain in left ankle and joints of left foot: Secondary | ICD-10-CM | POA: Diagnosis not present

## 2020-10-13 DIAGNOSIS — M799 Soft tissue disorder, unspecified: Secondary | ICD-10-CM | POA: Diagnosis not present

## 2020-10-13 DIAGNOSIS — M6281 Muscle weakness (generalized): Secondary | ICD-10-CM | POA: Diagnosis not present

## 2020-10-20 DIAGNOSIS — M25572 Pain in left ankle and joints of left foot: Secondary | ICD-10-CM | POA: Diagnosis not present

## 2020-10-20 DIAGNOSIS — M799 Soft tissue disorder, unspecified: Secondary | ICD-10-CM | POA: Diagnosis not present

## 2020-10-20 DIAGNOSIS — R262 Difficulty in walking, not elsewhere classified: Secondary | ICD-10-CM | POA: Diagnosis not present

## 2020-10-20 DIAGNOSIS — M6281 Muscle weakness (generalized): Secondary | ICD-10-CM | POA: Diagnosis not present

## 2020-10-22 DIAGNOSIS — M25572 Pain in left ankle and joints of left foot: Secondary | ICD-10-CM | POA: Diagnosis not present

## 2020-10-22 DIAGNOSIS — M6281 Muscle weakness (generalized): Secondary | ICD-10-CM | POA: Diagnosis not present

## 2020-10-22 DIAGNOSIS — M799 Soft tissue disorder, unspecified: Secondary | ICD-10-CM | POA: Diagnosis not present

## 2020-10-22 DIAGNOSIS — R262 Difficulty in walking, not elsewhere classified: Secondary | ICD-10-CM | POA: Diagnosis not present

## 2020-10-27 DIAGNOSIS — M6281 Muscle weakness (generalized): Secondary | ICD-10-CM | POA: Diagnosis not present

## 2020-10-27 DIAGNOSIS — M25572 Pain in left ankle and joints of left foot: Secondary | ICD-10-CM | POA: Diagnosis not present

## 2020-10-27 DIAGNOSIS — R262 Difficulty in walking, not elsewhere classified: Secondary | ICD-10-CM | POA: Diagnosis not present

## 2020-10-27 DIAGNOSIS — M799 Soft tissue disorder, unspecified: Secondary | ICD-10-CM | POA: Diagnosis not present

## 2020-10-29 DIAGNOSIS — M25572 Pain in left ankle and joints of left foot: Secondary | ICD-10-CM | POA: Diagnosis not present

## 2020-10-29 DIAGNOSIS — M799 Soft tissue disorder, unspecified: Secondary | ICD-10-CM | POA: Diagnosis not present

## 2020-10-29 DIAGNOSIS — R262 Difficulty in walking, not elsewhere classified: Secondary | ICD-10-CM | POA: Diagnosis not present

## 2020-10-29 DIAGNOSIS — M6281 Muscle weakness (generalized): Secondary | ICD-10-CM | POA: Diagnosis not present

## 2020-11-03 DIAGNOSIS — M799 Soft tissue disorder, unspecified: Secondary | ICD-10-CM | POA: Diagnosis not present

## 2020-11-03 DIAGNOSIS — M25572 Pain in left ankle and joints of left foot: Secondary | ICD-10-CM | POA: Diagnosis not present

## 2020-11-03 DIAGNOSIS — R262 Difficulty in walking, not elsewhere classified: Secondary | ICD-10-CM | POA: Diagnosis not present

## 2020-11-03 DIAGNOSIS — M6281 Muscle weakness (generalized): Secondary | ICD-10-CM | POA: Diagnosis not present

## 2020-11-05 DIAGNOSIS — M799 Soft tissue disorder, unspecified: Secondary | ICD-10-CM | POA: Diagnosis not present

## 2020-11-05 DIAGNOSIS — M6281 Muscle weakness (generalized): Secondary | ICD-10-CM | POA: Diagnosis not present

## 2020-11-05 DIAGNOSIS — R262 Difficulty in walking, not elsewhere classified: Secondary | ICD-10-CM | POA: Diagnosis not present

## 2020-11-05 DIAGNOSIS — M25572 Pain in left ankle and joints of left foot: Secondary | ICD-10-CM | POA: Diagnosis not present

## 2020-11-09 ENCOUNTER — Other Ambulatory Visit: Payer: Self-pay

## 2020-11-09 DIAGNOSIS — E782 Mixed hyperlipidemia: Secondary | ICD-10-CM | POA: Diagnosis not present

## 2020-11-09 LAB — LIPID PANEL
Chol/HDL Ratio: 2.3 ratio (ref 0.0–4.4)
Cholesterol, Total: 128 mg/dL (ref 100–199)
HDL: 56 mg/dL (ref >39)
LDL Chol Calc (NIH): 53 mg/dL (ref 0–99)
Triglycerides: 107 mg/dL (ref 0–149)
VLDL Cholesterol Cal: 19 mg/dL (ref 5–40)

## 2020-11-10 DIAGNOSIS — M6281 Muscle weakness (generalized): Secondary | ICD-10-CM | POA: Diagnosis not present

## 2020-11-10 DIAGNOSIS — R262 Difficulty in walking, not elsewhere classified: Secondary | ICD-10-CM | POA: Diagnosis not present

## 2020-11-10 DIAGNOSIS — M25572 Pain in left ankle and joints of left foot: Secondary | ICD-10-CM | POA: Diagnosis not present

## 2020-11-10 DIAGNOSIS — M799 Soft tissue disorder, unspecified: Secondary | ICD-10-CM | POA: Diagnosis not present

## 2020-11-12 DIAGNOSIS — M799 Soft tissue disorder, unspecified: Secondary | ICD-10-CM | POA: Diagnosis not present

## 2020-11-12 DIAGNOSIS — M25572 Pain in left ankle and joints of left foot: Secondary | ICD-10-CM | POA: Diagnosis not present

## 2020-11-12 DIAGNOSIS — M6281 Muscle weakness (generalized): Secondary | ICD-10-CM | POA: Diagnosis not present

## 2020-11-12 DIAGNOSIS — R262 Difficulty in walking, not elsewhere classified: Secondary | ICD-10-CM | POA: Diagnosis not present

## 2020-11-15 NOTE — Progress Notes (Signed)
Cardiology Office Note:   Date:  11/16/2020  NAME:  Samantha Perkins    MRN: [id number] DOB:  [birth year]   PCP:  Lesleigh Noe, MD  Cardiologist:  None  Electrophysiologist:  None   Referring MD: Lesleigh Noe, MD   Chief Complaint  Patient presents with  . Follow-up        History of Present Illness:   Samantha Perkins is a 70 y.o. female with a hx of prior MI, HLD, PACs, carotid artery disease who presents for follow-up. LDL was not at goal so we increased her lipitor. LDL now at goal. Stress test in 2021 in [place] showed lateral infarct. Carotid US showed no carotid disease. She reports she is doing well overall.  Does describe some cramping in her legs on Lipitor 40 mg daily.  She did not notice this on 20 mg daily.  We discussed reducing the dose and trying Zetia 10 mg daily.  She is interested.  Her blood pressure is 120/74.  She reports she is not walking due to bone spurs in her legs.  She plans to do water aerobics at the [facility] soon.  Nonetheless she denies any chest pain or significant shortness of breath.  We went over her carotid ultrasounds.  No significant disease here.  Examination demonstrates no bruits.  I wonder if this was just an incidental finding in [place] that was not found here.  We can monitor this with a repeat ultrasound in a year.  Overall she is doing well without any major symptoms.  Problem List 1. CAD -Small inferior infarct 01/28/2006 with totally occluded distal circumflex was unsuccessful angioplasty [facility]) -Cardiac PET 04/14/2020 ([place]): Large, fixed perfusion defect of the lateral wall, no ischemia, findings consistent with prior MI, EF 59% 2. HLD -T chol 128, HDL 56, LDL 53, TG 107 3. Hypothyroidism  4. Osteopenia 5.  PACs -Treated with metoprolol 6.  Carotid artery disease -40 to 59% left ICA (2020; [place]) -no disease 09/08/2020  Past Medical History: Past Medical History:  Diagnosis Date   . Carotid artery occlusion   . Coronary artery disease    Distal LCX MI 2007 in [place]. PCI unable to be performed.   . Thyroid disease     Past Surgical History: Past Surgical History:  Procedure Laterality Date  . ABDOMINAL HYSTERECTOMY    . APPENDECTOMY    . BILATERAL OOPHORECTOMY    . CARDIAC CATHETERIZATION    . CATARACT EXTRACTION, BILATERAL    . GUM SURGERY    . KNEE ARTHROSCOPY    . LASIK    . OVARIAN CYST REMOVAL    . ROTATOR CUFF REPAIR Right   . ROTATOR CUFF REPAIR W/ DISTAL CLAVICLE EXCISION Left   . THYROID SURGERY  12/12/2007  . THYROIDECTOMY, PARTIAL    . TUBAL LIGATION      Current Medications: Current Meds  Medication Sig  . aspirin EC 81 MG tablet Take 81 mg by mouth daily. Swallow whole.  Marland Kitchen atorvastatin (LIPITOR) 20 MG tablet Take 1 tablet (20 mg total) by mouth daily.  Marland Kitchen azelastine (ASTELIN) 0.1 % nasal spray as needed.  . cetirizine (ZYRTEC ALLERGY) 10 MG tablet Take 1 tablet (10 mg total) by mouth daily. (Patient taking differently: Take 10 mg by mouth as needed.)  . desonide (DESOWEN) 0.05 % cream Apply topically as needed.  . diclofenac (VOLTAREN) 0.1 % ophthalmic solution 4 (four) times daily.  Marland Kitchen estradiol (ESTRACE) 0.5 MG tablet  Take 1 tablet (0.5 mg total) by mouth daily.  Marland Kitchen ezetimibe (ZETIA) 10 MG tablet Take 1 tablet (10 mg total) by mouth daily.  . fluocinonide gel (LIDEX) 0.05 % Apply topically once a week.  . levothyroxine (SYNTHROID) 88 MCG tablet Take 88 mcg by mouth daily before breakfast.  . liothyronine (CYTOMEL) 5 MCG tablet Take 5 mcg by mouth daily.  . Melatonin 5 MG CAPS Take 5 mg by mouth at bedtime.  . metoprolol succinate (TOPROL-XL) 25 MG 24 hr tablet Take 25 mg by mouth daily.  . Multiple Vitamins-Minerals (CULTURELLE PROBIOTICS + MULTIV) CHEW Chew by mouth.  . Multiple Vitamins-Minerals (ONE-A-DAY WOMENS 50+ ADVANTAGE PO) Take by mouth daily.  . nitroGLYCERIN (NITROSTAT) 0.4 MG SL tablet Place 0.4 mg under the tongue as  needed.  . nystatin cream (MYCOSTATIN) Apply 1 application topically 3 (three) times daily as needed.  . [DISCONTINUED] atorvastatin (LIPITOR) 40 MG tablet Take 1 tablet (40 mg total) by mouth daily.     Allergies:    Cymbalta [duloxetine hcl], Duloxetine, Latex, and Wound dressing adhesive   Social History: Social History   Socioeconomic History  . Marital status: Married    Spouse name: Hiram Comber  . Number of children: 0  . Years of education: master's degree  . Highest education level: Not on file  Occupational History  . Occupation: retired Pharmacist, hospital  Tobacco Use  . Smoking status: Never Smoker  . Smokeless tobacco: Never Used  Vaping Use  . Vaping Use: Never used  Substance and Sexual Activity  . Alcohol use: Yes    Comment: twice a week, 1 drink  . Drug use: Never  . Sexual activity: Not Currently    Birth control/protection: Surgical  Other Topics Concern  . Not on file  Social History Narrative   07/26/20   From: [place], moved to be near children   Living: with husband, Hiram Comber [id number])   Work: retired - Lexicographer      Family: step daughter - Product manager - 2 step granddaughters - in the area      Enjoys: Environmental education officer, explore new places, reading, volunteering       Exercise: not currently, joining the [employer]   Diet: tries to eat healthy, not as good as she did before      Safety   Seat belts: Yes    Guns: No   Safe in relationships: Yes    Social Determinants of Radio broadcast assistant Strain: Not on file  Food Insecurity: Not on file  Transportation Needs: Not on file  Physical Activity: Not on file  Stress: Not on file  Social Connections: Not on file     Family History: The patient's family history includes Aneurysm in her mother; Arthritis in her father and sister; Bladder Cancer in her sister; Bradycardia in her sister; COPD in her father; Cataracts in her sister; Colon cancer in her maternal uncle;  Crohn's disease in her sister; Diabetes in her father, maternal grandmother, and sister; Fibroids in her sister; Heart disease in her father; Heart failure in her mother; Heart murmur in her father; Hypertension in her father and sister; Hypothyroidism in her mother; Irritable bowel syndrome in her sister; Liver cancer in her sister; Lung cancer in her mother and sister; Mitral valve prolapse in her mother, sister, and sister; Osteopenia in her sister; Pancreatic cancer in her maternal uncle; Stroke in her mother and sister.  ROS:   All other ROS reviewed  and negative. Pertinent positives noted in the HPI.     EKGs/Labs/Other Studies Reviewed:   The following studies were personally reviewed by me today:   Vasc US 09/08/2020 Right Carotid: There is no evidence of stenosis in the right ICA.   Left Carotid: There is no evidence of stenosis in the left ICA.  Recent Labs: 07/26/2020: ALT 14; BUN 16; Creatinine, Ser 1.01; Hemoglobin 12.9; Platelets 210.0; Potassium 4.8; Sodium 138; TSH 0.96   Recent Lipid Panel    Component Value Date/Time   CHOL 128 11/09/2020 0911   TRIG 107 11/09/2020 0911   HDL 56 11/09/2020 0911   CHOLHDL 2.3 11/09/2020 0911   CHOLHDL 3 07/26/2020 1610   VLDL 52.2 (H) 07/26/2020 1610   LDLCALC 53 11/09/2020 0911   LDLDIRECT 91 09/06/2020 1001   LDLDIRECT 73.0 07/26/2020 1610    Physical Exam:   VS:  BP 128/74   Pulse 70   Ht 5\' 3"  (1.6 m)   Wt 207 lb 3.2 oz (94 kg)   SpO2 98%   BMI 36.70 kg/m    Wt Readings from Last 3 Encounters:  11/16/20 207 lb 3.2 oz (94 kg)  09/17/20 207 lb 4 oz (94 kg)  09/06/20 200 lb (90.7 kg)    General: Well nourished, well developed, in no acute distress Head: Atraumatic, normal size  Eyes: PEERLA, EOMI  Neck: Supple, no JVD Endocrine: No thryomegaly Cardiac: Normal S1, S2; RRR; no murmurs, rubs, or gallops Lungs: Clear to auscultation bilaterally, no wheezing, rhonchi or rales  Abd: Soft, nontender, no hepatomegaly  Ext:  No edema, pulses 2+ Musculoskeletal: No deformities, BUE and BLE strength normal and equal Skin: Warm and dry, no rashes   Neuro: Alert and oriented to person, place, time, and situation, CNII-XII grossly intact, no focal deficits  Psych: Normal mood and affect   ASSESSMENT:   Samantha Perkins is a 70 y.o. female who presents for the following: 1. Coronary artery disease involving native coronary artery of native heart without angina pectoris   2. Mixed hyperlipidemia   3. Bilateral carotid artery stenosis     PLAN:   1. Coronary artery disease involving native coronary artery of native heart without angina pectoris 2. Mixed hyperlipidemia -She suffered a myocardial infarction in 2007.  Unsuccessful angioplasty of the distal left circumflex.  Recent stress test in [place] on 04/14/2020 demonstrated a large fixed perfusion defect of the lateral wall with no ischemia.  She had normal LV function. -She has no symptoms of chest pain or shortness of breath.  Overall doing well. -We stopped her Plavix.  She will continue aspirin 81 mg daily -She does describe some cramping with Lipitor 40 mg daily.  Her LDL cholesterol is perfect.  I have recommended to drop the dose back to 20 mg daily and to add Zetia 10 mg daily.  Hopefully cost would not be an issue.  She will let us know.  Would prefer to have her LDL cholesterol close to 50 which she currently has.  3. Bilateral carotid artery stenosis -Found to have carotid disease in [place].  Ultrasound here showed no significant disease.  No bruit on examination.  Could have been an error [place].  We will plan to repeat an ultrasound in the next 1 to 2 years.  For now she will continue aspirin and cholesterol-lowering agents as detailed above.  Disposition: Return in about 1 year (around 11/16/2021).  Medication Adjustments/Labs and Tests Ordered: Current medicines are reviewed at length  with the patient today.  Concerns regarding  medicines are outlined above.  No orders of the defined types were placed in this encounter.  Meds ordered this encounter  Medications  . atorvastatin (LIPITOR) 20 MG tablet    Sig: Take 1 tablet (20 mg total) by mouth daily.    Dispense:  90 tablet    Refill:  3  . ezetimibe (ZETIA) 10 MG tablet    Sig: Take 1 tablet (10 mg total) by mouth daily.    Dispense:  90 tablet    Refill:  3    Patient Instructions  Medication Instructions:  Decrease Lipitor 20 mg daily  Start Zetia 10 mg daily   *If you need a refill on your cardiac medications before your next appointment, please call your pharmacy*   Follow-Up: At [facility], you and your health needs are our priority.  As part of our continuing mission to provide you with exceptional heart care, we have created designated Provider Care Teams.  These Care Teams include your primary Cardiologist (physician) and Advanced Practice Providers (APPs -  Physician Assistants and Nurse Practitioners) who all work together to provide you with the care you need, when you need it.  We recommend signing up for the patient portal called "MyChart".  Sign up information is provided on this After Visit Summary.  MyChart is used to connect with patients for Virtual Visits (Telemedicine).  Patients are able to view lab/test results, encounter notes, upcoming appointments, etc.  Non-urgent messages can be sent to your provider as well.   To learn more about what you can do with MyChart, go to [email or web address].    Your next appointment:   12 month(s)  The format for your next appointment:   In Person  Provider:   Eleonore Chiquito, MD         Time Spent with Patient: I have spent a total of 35 minutes with patient reviewing hospital notes, telemetry, EKGs, labs and examining the patient as well as establishing an assessment and plan that was discussed with the patient.  > 50% of time was spent in direct patient care.  Signed, Addison Naegeli.  Audie Box, MD, [place]  [address] [place], [place] [address] [phone]  11/16/2020 11:27 AM

## 2020-11-16 ENCOUNTER — Encounter: Payer: Self-pay | Admitting: Cardiovascular Disease

## 2020-11-16 ENCOUNTER — Other Ambulatory Visit: Payer: Self-pay

## 2020-11-16 ENCOUNTER — Other Ambulatory Visit: Payer: Medicare HMO

## 2020-11-16 ENCOUNTER — Ambulatory Visit: Payer: Medicare HMO | Admitting: Cardiovascular Disease

## 2020-11-16 VITALS — BP 128/74 | HR 70 | Ht 63.0 in | Wt 207.2 lb

## 2020-11-16 DIAGNOSIS — E782 Mixed hyperlipidemia: Secondary | ICD-10-CM

## 2020-11-16 DIAGNOSIS — I2510 Atherosclerotic heart disease of native coronary artery without angina pectoris: Secondary | ICD-10-CM | POA: Diagnosis not present

## 2020-11-16 DIAGNOSIS — I6523 Occlusion and stenosis of bilateral carotid arteries: Secondary | ICD-10-CM | POA: Diagnosis not present

## 2020-11-16 MED ORDER — EZETIMIBE 10 MG PO TABS: 10.0000 mg | ORAL_TABLET | Freq: Every day | ORAL | 3 refills | Status: DC

## 2020-11-16 MED ORDER — ATORVASTATIN CALCIUM 20 MG PO TABS: 20.0000 mg | ORAL_TABLET | Freq: Every day | ORAL | 3 refills | Status: DC

## 2020-11-16 NOTE — Patient Instructions (Addendum)
Medication Instructions:  Decrease Lipitor 20 mg daily  Start Zetia 10 mg daily   *If you need a refill on your cardiac medications before your next appointment, please call your pharmacy*   Follow-Up: At [facility], you and your health needs are our priority.  As part of our continuing mission to provide you with exceptional heart care, we have created designated Provider Care Teams.  These Care Teams include your primary Cardiologist (physician) and Advanced Practice Providers (APPs -  Physician Assistants and Nurse Practitioners) who all work together to provide you with the care you need, when you need it.  We recommend signing up for the patient portal called "MyChart".  Sign up information is provided on this After Visit Summary.  MyChart is used to connect with patients for Virtual Visits (Telemedicine).  Patients are able to view lab/test results, encounter notes, upcoming appointments, etc.  Non-urgent messages can be sent to your provider as well.   To learn more about what you can do with MyChart, go to [email or web address].    Your next appointment:   12 month(s)  The format for your next appointment:   In Person  Provider:   Eleonore Chiquito, MD

## 2020-11-17 ENCOUNTER — Other Ambulatory Visit (INDEPENDENT_AMBULATORY_CARE_PROVIDER_SITE_OTHER): Payer: Medicare HMO

## 2020-11-17 ENCOUNTER — Telehealth: Payer: Self-pay | Admitting: Internal Medicine

## 2020-11-17 DIAGNOSIS — M8000XS Age-related osteoporosis with current pathological fracture, unspecified site, sequela: Secondary | ICD-10-CM

## 2020-11-17 DIAGNOSIS — E063 Autoimmune thyroiditis: Secondary | ICD-10-CM | POA: Diagnosis not present

## 2020-11-17 DIAGNOSIS — E038 Other specified hypothyroidism: Secondary | ICD-10-CM

## 2020-11-17 LAB — VITAMIN D 25 HYDROXY (VIT D DEFICIENCY, FRACTURES): VITD: 58.31 ng/mL (ref 30.00–100.00)

## 2020-11-17 LAB — TSH: TSH: 2.13 u[IU]/mL (ref 0.35–4.50)

## 2020-11-17 LAB — ALBUMIN: Albumin: 4.2 g/dL (ref 3.5–5.2)

## 2020-11-17 LAB — CALCIUM: Calcium: 9.5 mg/dL (ref 8.4–10.5)

## 2020-11-17 LAB — MAGNESIUM: Magnesium: 2 mg/dL (ref 1.5–2.5)

## 2020-11-17 LAB — PHOSPHORUS: Phosphorus: 4 mg/dL (ref 2.3–4.6)

## 2020-11-17 NOTE — Telephone Encounter (Signed)
REFILL REQUEST:  Synthroid and liothyronine   PHARMACY:  [facility], [address]  [place], [place] [address]  Phone:  [phone] Fax:  [phone]    NEXT APPT: 01/31/21 - patient asked that per her lab results from today, should her medications remain the same? Patient said communication through [facility] is ok.

## 2020-11-17 NOTE — Telephone Encounter (Signed)
Please advise. Dr Kelton Pillar did not have time to review yet.

## 2020-11-18 DIAGNOSIS — M6281 Muscle weakness (generalized): Secondary | ICD-10-CM | POA: Diagnosis not present

## 2020-11-18 DIAGNOSIS — M799 Soft tissue disorder, unspecified: Secondary | ICD-10-CM | POA: Diagnosis not present

## 2020-11-18 DIAGNOSIS — M25572 Pain in left ankle and joints of left foot: Secondary | ICD-10-CM | POA: Diagnosis not present

## 2020-11-18 DIAGNOSIS — R262 Difficulty in walking, not elsewhere classified: Secondary | ICD-10-CM | POA: Diagnosis not present

## 2020-11-18 LAB — PARATHYROID HORMONE, INTACT (NO CA): PTH: 35 pg/mL (ref 16–77)

## 2020-11-18 MED ORDER — LIOTHYRONINE SODIUM 5 MCG PO TABS: 5.0000 ug | ORAL_TABLET | Freq: Every day | ORAL | 1 refills | Status: DC

## 2020-11-18 MED ORDER — LEVOTHYROXINE SODIUM 88 MCG PO TABS: 88.0000 ug | ORAL_TABLET | Freq: Every day | ORAL | 1 refills | Status: DC

## 2020-11-18 NOTE — Telephone Encounter (Signed)
Patient viewed results on MyChart. 

## 2020-11-19 DIAGNOSIS — M799 Soft tissue disorder, unspecified: Secondary | ICD-10-CM | POA: Diagnosis not present

## 2020-11-19 DIAGNOSIS — M6281 Muscle weakness (generalized): Secondary | ICD-10-CM | POA: Diagnosis not present

## 2020-11-19 DIAGNOSIS — M25572 Pain in left ankle and joints of left foot: Secondary | ICD-10-CM | POA: Diagnosis not present

## 2020-11-19 DIAGNOSIS — R262 Difficulty in walking, not elsewhere classified: Secondary | ICD-10-CM | POA: Diagnosis not present

## 2020-12-09 ENCOUNTER — Other Ambulatory Visit: Payer: Self-pay | Admitting: Family Medicine

## 2020-12-09 ENCOUNTER — Encounter: Payer: Self-pay | Admitting: Family Medicine

## 2020-12-09 ENCOUNTER — Other Ambulatory Visit (INDEPENDENT_AMBULATORY_CARE_PROVIDER_SITE_OTHER): Payer: Medicare HMO

## 2020-12-09 ENCOUNTER — Other Ambulatory Visit: Payer: Self-pay

## 2020-12-09 ENCOUNTER — Telehealth (INDEPENDENT_AMBULATORY_CARE_PROVIDER_SITE_OTHER): Payer: Medicare HMO | Admitting: Family Medicine

## 2020-12-09 VITALS — HR 108 | Temp 98.5°F | Wt 203.0 lb

## 2020-12-09 DIAGNOSIS — Z20822 Contact with and (suspected) exposure to covid-19: Secondary | ICD-10-CM | POA: Diagnosis not present

## 2020-12-09 DIAGNOSIS — J069 Acute upper respiratory infection, unspecified: Secondary | ICD-10-CM | POA: Diagnosis not present

## 2020-12-09 MED ORDER — BENZONATATE 100 MG PO CAPS: 100.0000 mg | ORAL_CAPSULE | Freq: Three times a day (TID) | ORAL | 0 refills | Status: DC | PRN

## 2020-12-09 MED ORDER — GUAIFENESIN-CODEINE 100-10 MG/5ML PO SOLN: 5.0000 mL | Freq: Three times a day (TID) | ORAL | 0 refills | Status: DC | PRN

## 2020-12-09 NOTE — Progress Notes (Signed)
I connected with Samantha Perkins on 12/09/20 at 10:00 AM EDT by video and verified that I am speaking with the correct person using two identifiers.   I discussed the limitations, risks, security and privacy concerns of performing an evaluation and management service by video and the availability of in person appointments. I also discussed with the patient that there may be a patient responsible charge related to this service. The patient expressed understanding and agreed to proceed.  Patient location: Home Provider Location: [place] Participants: Lesleigh Noe and JACLYN ANDY   Subjective:     Samantha Perkins is a 70 y.o. female presenting for Sore Throat (Onset of sx 5/23), Nasal Congestion (Yellow discharge in the morning only ), Cough, and Fatigue     Sore Throat  This is a new (12/06/2020) problem. The problem has been gradually worsening. There has been no fever. Associated symptoms include congestion and coughing. Pertinent negatives include no diarrhea, ear pain, headaches, shortness of breath, swollen glands or vomiting.  Cough This is a new problem. The cough is productive of sputum. Associated symptoms include nasal congestion. Pertinent negatives include no chest pain, chills, ear pain, fever, headaches or shortness of breath.   Tried allergy medications w/o relief     Sick contact: no  Loss of taste or smell: no   Review of Systems  Constitutional: Positive for fatigue. Negative for chills and fever.  HENT: Positive for congestion. Negative for ear pain.   Respiratory: Positive for cough. Negative for shortness of breath.   Cardiovascular: Negative for chest pain.  Gastrointestinal: Negative for diarrhea and vomiting.  Neurological: Negative for headaches.     Social History   Tobacco Use  Smoking Status Never Smoker  Smokeless Tobacco Never Used        Objective:   BP Readings from Last 3 Encounters:  11/16/20 128/74  09/17/20  126/80  09/06/20 116/70   Wt Readings from Last 3 Encounters:  12/09/20 203 lb (92.1 kg)  11/16/20 207 lb 3.2 oz (94 kg)  09/17/20 207 lb 4 oz (94 kg)    Pulse (!) 108   Temp 98.5 F (36.9 C) (Oral)   Wt 203 lb (92.1 kg)   SpO2 96%   BMI 35.96 kg/m    Physical Exam Constitutional:      Appearance: Normal appearance. She is not ill-appearing.  HENT:     Head: Normocephalic and atraumatic.     Right Ear: External ear normal.     Left Ear: External ear normal.  Eyes:     Conjunctiva/sclera: Conjunctivae normal.  Pulmonary:     Effort: Pulmonary effort is normal. No respiratory distress.  Neurological:     Mental Status: She is alert. Mental status is at baseline.  Psychiatric:        Mood and Affect: Mood normal.        Behavior: Behavior normal.        Thought Content: Thought content normal.        Judgment: Judgment normal.          Assessment & Plan:   Problem List Items Addressed This Visit   None   Visit Diagnoses    Viral URI with cough    -  Primary   Relevant Medications   guaiFENesin-codeine 100-10 MG/5ML syrup   benzonatate (TESSALON PERLES) 100 MG capsule   Suspected COVID-19 virus infection         Discussed at increased risk  for severe covid - CAD and obesity and would be elligible for Paxlovid treatment if positive.   Discussed OTC treatment for viral illness Patient will come today for drive-up testing Instructed to isolate until the results come back  If negative and symptoms persisting will call back for consideration for course of antibiotics   Return if symptoms worsen or fail to improve.  Lesleigh Noe, MD

## 2020-12-10 ENCOUNTER — Other Ambulatory Visit: Payer: Self-pay | Admitting: Family Medicine

## 2020-12-10 DIAGNOSIS — U071 COVID-19: Secondary | ICD-10-CM

## 2020-12-10 LAB — NOVEL CORONAVIRUS, NAA: SARS-CoV-2, NAA: DETECTED — AB

## 2020-12-10 LAB — SARS-COV-2, NAA 2 DAY TAT

## 2020-12-10 MED ORDER — NIRMATRELVIR/RITONAVIR (PAXLOVID) TABLET (RENAL DOSING): 2.0000 | ORAL_TABLET | Freq: Two times a day (BID) | ORAL | 0 refills | Status: AC

## 2020-12-14 ENCOUNTER — Ambulatory Visit: Payer: Medicare HMO | Admitting: Podiatry

## 2020-12-14 ENCOUNTER — Telehealth: Payer: Self-pay

## 2020-12-14 NOTE — Telephone Encounter (Signed)
Noted  

## 2020-12-14 NOTE — Telephone Encounter (Signed)
[name] - Client TELEPHONE ADVICE RECORD [facility] Patient Name: Samantha Perkins Gender: Female DOB: [birth year] Age: 70 Y 62 M 18 D Return Phone Number: [phone] (Primary) Address: City/ State/ Zip: [place] [address] Client [facility] Night - Client Client Site [place] Physician Waunita Schooner- MD Contact Type Call Who Is Calling Patient / Member / Family / Caregiver Call Type Triage / Clinical Relationship To Patient Self Return Phone Number [phone] (Primary) Chief Complaint Cough Reason for Call Request for Lab/Test Results Initial Comment Caller needs help understanding her results for COVID test and if positve would like to know if she is able to get medication. Caller has cough, nasel drainage, and fatigued. Translation No Nurse Assessment Nurse: Montine Circle, RN, Donise Date/Time (Eastern Time): 12/10/2020 10:57:14 PM Confirm and document reason for call. If symptomatic, describe symptoms. ---Caller states that her physician messaged her back since she first called. She is positive for Covid and medication was sent to her pharmacy. Caller has cough, nasal drainage, and fatigued. No new or worsening symptoms. Does the patient have any new or worsening symptoms? ---No Disp. Time Eilene Ghazi Time) Disposition Final User 12/10/2020 10:37:29 PM Attempt made - message left [place], RN, Donise 12/10/2020 11:01:59 PM Clinical Call Yes Montine Circle, RN, Donise

## 2020-12-24 ENCOUNTER — Other Ambulatory Visit: Payer: Self-pay

## 2020-12-28 ENCOUNTER — Encounter: Payer: Self-pay | Admitting: Podiatry

## 2020-12-28 ENCOUNTER — Other Ambulatory Visit: Payer: Self-pay

## 2020-12-28 ENCOUNTER — Ambulatory Visit: Payer: Medicare HMO | Admitting: Podiatry

## 2020-12-28 DIAGNOSIS — M7672 Peroneal tendinitis, left leg: Secondary | ICD-10-CM

## 2020-12-28 DIAGNOSIS — S92352D Displaced fracture of fifth metatarsal bone, left foot, subsequent encounter for fracture with routine healing: Secondary | ICD-10-CM

## 2020-12-28 NOTE — Progress Notes (Signed)
  Subjective:  Patient ID: Samantha Perkins, female    DOB: May 17, 1951,  MRN: [id number]  Chief Complaint  Patient presents with   Tendonitis    6 week follow up peroneal tendinitis    70 y.o. female returns with the above complaint. History confirmed with patient.  Overall she is doing very well after doing therapy sessions about 80% better so.  Is out of boot and back in regular shoes at this point  Objective:  Physical Exam: warm, good capillary refill, no trophic changes or ulcerative lesions, normal DP and PT pulses and normal sensory exam. Left Foot:  no pain today palpation to the fifth metatarsal base.  5 out of 5 strength.  Minimal pain with resisted eversion.  Radiographs: X-ray of the left foot: Age-indeterminate but appears to be acute avulsion fracture of the fifth metatarsal base  Study Result  Narrative & Impression  CLINICAL DATA:  Left ankle pain   EXAM: MRI OF THE LEFT ANKLE WITHOUT CONTRAST   TECHNIQUE: Multiplanar, multisequence MR imaging of the ankle was performed. No intravenous contrast was administered.   COMPARISON:  None.   FINDINGS: TENDONS   Peroneal: Intact peroneus longus and peroneus brevis tendons.   Posteromedial: Intact tibialis posterior, flexor hallucis longus and flexor digitorum longus tendons.   Anterior: Intact tibialis anterior, extensor hallucis longus and extensor digitorum longus tendons.   Achilles: Intact.   Plantar Fascia: Intact.   LIGAMENTS   Lateral: The anterior and posterior tibiofibular ligaments are intact. The anterior and posterior talofibular ligaments are intact. Intact calcaneofibular ligament.   Medial: Deltoid ligament and spring ligament complex intact.   CARTILAGE   Ankle Joint: No joint effusion or chondral defect.   Subtalar Joints/Sinus Tarsi: No joint effusion or chondral defect. Preservation of the anatomic fat within the sinus tarsi.   Bones: Pes planus alignment. Minimal residual  marrow edema at site of tiny nondisplaced cortical avulsion at the fifth metatarsal base (series 4, image 27). Elsewhere, no evidence of fracture. No dislocation. Bidirectional calcaneal enthesophytes. No significant arthropathy. No joint effusions. No suspicious bone lesion.   Other: No fluid collections.   IMPRESSION: 1. Minimal residual marrow edema at site of subacute fifth metatarsal base avulsion fracture. 2. Pes planus alignment. 3. Intact tendons and ligaments of the ankle. Specifically, no peroneal tendinopathy, tear, or tenosynovitis.     Electronically Signed   By: Davina Poke D.O.   On: 09/30/2020 11:45    Assessment:   1. Peroneal tendinitis of left lower extremity   2. Closed fracture of base of fifth metatarsal bone of left foot with routine healing       Plan:  Patient was evaluated and treated and all questions answered.  -Overall doing much better.  Recommended she begin to gently increase her activity level.  Return as needed if it recurs.  Discussed that the fracture fragment will remain there as long as she is pain-free..  No follow-ups on file.

## 2020-12-30 ENCOUNTER — Telehealth: Payer: Self-pay

## 2020-12-30 DIAGNOSIS — Z1231 Encounter for screening mammogram for malignant neoplasm of breast: Secondary | ICD-10-CM

## 2020-12-30 NOTE — Telephone Encounter (Signed)
Mammogram ordered  Pt can call to schedule

## 2020-12-30 NOTE — Telephone Encounter (Signed)
Patient called asking for order for Mammogram-screening-for [facility] location. Please advise.   Per chart her last one was in 2020 before she moved to [facility] but patient thought she had one in July 2021 which I advised patient I did not see.

## 2020-12-31 NOTE — Telephone Encounter (Signed)
Called and left a VM for pt letting her know the order was placed and she can schedule her mammogram now.

## 2021-01-18 ENCOUNTER — Other Ambulatory Visit: Payer: Self-pay | Admitting: Family Medicine

## 2021-01-19 ENCOUNTER — Other Ambulatory Visit: Payer: Self-pay

## 2021-01-19 ENCOUNTER — Ambulatory Visit (INDEPENDENT_AMBULATORY_CARE_PROVIDER_SITE_OTHER): Payer: Medicare HMO | Admitting: Family Medicine

## 2021-01-19 VITALS — BP 100/64 | HR 77 | Temp 97.6°F | Wt 212.8 lb

## 2021-01-19 DIAGNOSIS — Z5181 Encounter for therapeutic drug level monitoring: Secondary | ICD-10-CM | POA: Diagnosis not present

## 2021-01-19 DIAGNOSIS — L237 Allergic contact dermatitis due to plants, except food: Secondary | ICD-10-CM | POA: Diagnosis not present

## 2021-01-19 DIAGNOSIS — R239 Unspecified skin changes: Secondary | ICD-10-CM | POA: Diagnosis not present

## 2021-01-19 DIAGNOSIS — Z79890 Hormone replacement therapy (postmenopausal): Secondary | ICD-10-CM | POA: Diagnosis not present

## 2021-01-19 MED ORDER — TRIAMCINOLONE ACETONIDE 0.5 % EX OINT: 1.0000 "application " | TOPICAL_OINTMENT | Freq: Two times a day (BID) | CUTANEOUS | 0 refills | Status: DC

## 2021-01-19 MED ORDER — ESTRADIOL 0.5 MG PO TABS: 0.5000 mg | ORAL_TABLET | Freq: Every day | ORAL | 1 refills | Status: DC

## 2021-01-19 MED ORDER — PREDNISONE 20 MG PO TABS: ORAL_TABLET | ORAL | 0 refills | Status: DC

## 2021-01-19 NOTE — Assessment & Plan Note (Signed)
Suspect contact dermatitis. Advised triamcinolone. If worsening prednisone given but advised avoiding.

## 2021-01-19 NOTE — Assessment & Plan Note (Signed)
Still with some hot flashes but still motivated to come off estrogen. Discussed continuing current dose through the summer and can take occasional 1 mg if needed. May consider dose decrease once hot flash frequency decreases

## 2021-01-19 NOTE — Progress Notes (Signed)
Subjective:     Samantha Perkins is a 70 y.o. female presenting for Rash (Right upper arm X 4 days)     HPI  #Rash - Right upper arm - started 5 days ago - started after holding a pot of petunias - trimming flowers - started as little red bumps and gradually moved over the arm - treatment: benadryl cream, cortisone 10, cold spray - very itchy - worse with showering  #Estrogen  - decreased dose from 1 to 0.5 mg - is having hot-flashes 3-4 times a week   #Weight gain - recently recovered from covid - moved w/ less exercise recently - looking for swimming   Review of Systems   Social History   Tobacco Use  Smoking Status Never  Smokeless Tobacco Never        Objective:    BP Readings from Last 3 Encounters:  01/19/21 100/64  11/16/20 128/74  09/17/20 126/80   Wt Readings from Last 3 Encounters:  01/19/21 212 lb 12 oz (96.5 kg)  12/09/20 203 lb (92.1 kg)  11/16/20 207 lb 3.2 oz (94 kg)    BP 100/64   Pulse 77   Temp 97.6 F (36.4 C) (Temporal)   Wt 212 lb 12 oz (96.5 kg)   SpO2 96%   BMI 37.69 kg/m    Physical Exam Constitutional:      General: She is not in acute distress.    Appearance: She is well-developed. She is not diaphoretic.  HENT:     Right Ear: External ear normal.     Left Ear: External ear normal.  Eyes:     Conjunctiva/sclera: Conjunctivae normal.  Cardiovascular:     Rate and Rhythm: Normal rate.  Pulmonary:     Effort: Pulmonary effort is normal.  Musculoskeletal:     Cervical back: Neck supple.  Skin:    General: Skin is warm and dry.     Capillary Refill: Capillary refill takes less than 2 seconds.     Comments: Right upper arm with erythematous macular papular rash with slight scale.   Neurological:     Mental Status: She is alert. Mental status is at baseline.  Psychiatric:        Mood and Affect: Mood normal.        Behavior: Behavior normal.          Assessment & Plan:   Problem List Items Addressed  This Visit       Musculoskeletal and Integument   Allergic contact dermatitis due to plants, except food - Primary    Suspect contact dermatitis. Advised triamcinolone. If worsening prednisone given but advised avoiding.        Relevant Medications   triamcinolone ointment (KENALOG) 0.5 %   predniSONE (DELTASONE) 20 MG tablet     Other   Encounter for monitoring postmenopausal estrogen replacement therapy    Still with some hot flashes but still motivated to come off estrogen. Discussed continuing current dose through the summer and can take occasional 1 mg if needed. May consider dose decrease once hot flash frequency decreases       Relevant Medications   estradiol (ESTRACE) 0.5 MG tablet   Other Visit Diagnoses     Skin complaints       Relevant Orders   Ambulatory referral to Dermatology        Return if symptoms worsen or fail to improve.  Lesleigh Noe, MD  This visit occurred during the SARS-CoV-2 public health  emergency.  Safety protocols were in place, including screening questions prior to the visit, additional usage of staff PPE, and extensive cleaning of exam room while observing appropriate contact time as indicated for disinfecting solutions.

## 2021-01-19 NOTE — Patient Instructions (Signed)
#  Rash - Avoid plants - topical steroids - if worsening can start oral steroids - if no change can update in 1 week  #Estrogen - can take 2 pills or 1.5 pills if several days of hot flashes

## 2021-01-26 ENCOUNTER — Ambulatory Visit: Payer: Medicare HMO | Admitting: Internal Medicine

## 2021-01-31 ENCOUNTER — Ambulatory Visit (INDEPENDENT_AMBULATORY_CARE_PROVIDER_SITE_OTHER): Payer: Medicare HMO | Admitting: Internal Medicine

## 2021-01-31 ENCOUNTER — Other Ambulatory Visit: Payer: Self-pay

## 2021-01-31 ENCOUNTER — Encounter: Payer: Self-pay | Admitting: Internal Medicine

## 2021-01-31 VITALS — BP 132/88 | HR 73 | Ht 63.0 in | Wt 215.0 lb

## 2021-01-31 DIAGNOSIS — E063 Autoimmune thyroiditis: Secondary | ICD-10-CM | POA: Diagnosis not present

## 2021-01-31 DIAGNOSIS — Z9009 Acquired absence of other part of head and neck: Secondary | ICD-10-CM | POA: Diagnosis not present

## 2021-01-31 DIAGNOSIS — E038 Other specified hypothyroidism: Secondary | ICD-10-CM | POA: Diagnosis not present

## 2021-01-31 NOTE — Progress Notes (Signed)
Name: Samantha Perkins  MRN/ DOB: [id number], [birth year]    Age/ Sex: 70 y.o., female     PCP: Lesleigh Noe, MD   Reason for Endocrinology Evaluation: Hashimoto's Thyroiditis and MNG     Initial Endocrinology Clinic Visit: 09/17/2020    PATIENT IDENTIFIER: [name] is a 70 y.o., female with a past medical history of Hashimoto's thyroiditis, Osteopenia, Dyslipidemia and CAD . She has followed with [facility] since 09/17/2020 for consultative assistance with management of her Hashimoto's Thyroiditis and MNG  HISTORICAL SUMMARY:  Moved from [place]   She was diagnosed with hypothyroidism in 2008 S/P left Lobectomy in 2009 for benign thyroid  Nodule nodule , was started on LT-4 replacement in 2009 A few years later Liothyronine was added due to sleep issues     Last thyroid ultrasound 03/2020, no new nodules per pt .   Has hx of several stress fractures with osteopenia for years. She has been on Actonel and Boniva , as of recent has been on Prolia ~ 3 yrs now She is on calcium 600 mg and Vitamin D through [facility]   Of note the pt had  MI in 2007, cardiac cath was unsucceful - follows with Dr. Marisue Ivan    SUBJECTIVE:     Today (01/31/2021):  Samantha Perkins is here for a follow up on postoperative hypothyroidism    Had COVID in 11/2020 C/O fatigue and memory changes   Denies constipation or diarrhea  Denies local neck symptoms   She has not been sleeping well at night, and would like to stop the Liothyronine   Synthroid 88 mcg , half a tablet on Sundays and 1 tablet the rest  Of the Week Liothyronine 5 mg daily  HISTORY:  Past Medical History:  Past Medical History:  Diagnosis Date   Carotid artery occlusion    Coronary artery disease    Distal LCX MI 2007 in [place]. PCI unable to be performed.    Thyroid disease    Past Surgical History:  Past Surgical History:  Procedure Laterality Date   ABDOMINAL HYSTERECTOMY     APPENDECTOMY      BILATERAL OOPHORECTOMY     CARDIAC CATHETERIZATION     CATARACT EXTRACTION, BILATERAL     GUM SURGERY     KNEE ARTHROSCOPY     LASIK     OVARIAN CYST REMOVAL     ROTATOR CUFF REPAIR Right    ROTATOR CUFF REPAIR W/ DISTAL CLAVICLE EXCISION Left    THYROID SURGERY  12/12/2007   THYROIDECTOMY, PARTIAL     TUBAL LIGATION     Social History:  reports that she has never smoked. She has never used smokeless tobacco. She reports current alcohol use. She reports that she does not use drugs. Family History:  Family History  Problem Relation Age of Onset   Heart failure Mother    Hypothyroidism Mother    Aneurysm Mother    Stroke Mother    Mitral valve prolapse Mother    Lung cancer Mother        smoker   Arthritis Father    Heart disease Father    Heart murmur Father    Diabetes Father    Hypertension Father    COPD Father    Diabetes Sister    Bladder Cancer Sister    Cataracts Sister    Arthritis Sister    Osteopenia Sister    Irritable bowel syndrome Sister    Mitral  valve prolapse Sister    Diabetes Maternal Grandmother    Fibroids Sister    Lung cancer Sister        smoker   Mitral valve prolapse Sister    Hypertension Sister    Bradycardia Sister        w/ pacemaker   Crohn's disease Sister    Stroke Sister    Liver cancer Sister    Colon cancer Maternal Uncle        3 uncles    Pancreatic cancer Maternal Uncle        2 uncles with both colon and pancreatic     HOME MEDICATIONS: Allergies as of 01/31/2021       Reactions   Cymbalta [duloxetine Hcl] Rash   Duloxetine    Latex    Other reaction(s): Blisters   Wound Dressing Adhesive Other (See Comments)        Medication List        Accurate as of January 31, 2021 10:15 AM. If you have any questions, ask your nurse or doctor.          aspirin EC 81 MG tablet Take 81 mg by mouth daily. Swallow whole.   atorvastatin 20 MG tablet Commonly known as: LIPITOR Take 1 tablet (20 mg total) by mouth  daily.   azelastine 0.1 % nasal spray Commonly known as: ASTELIN as needed.   cetirizine 10 MG tablet Commonly known as: ZyrTEC Allergy Take 1 tablet (10 mg total) by mouth daily. What changed:  when to take this reasons to take this   clobetasol ointment 0.05 % Commonly known as: TEMOVATE Apply 1 application topically 2 (two) times daily.   clobetasol 0.05 % Gel Commonly known as: TEMOVATE Apply topically 2 (two) times daily.   desonide 0.05 % cream Commonly known as: DESOWEN Apply topically as needed.   diclofenac 0.1 % ophthalmic solution Commonly known as: VOLTAREN 4 (four) times daily.   estradiol 0.5 MG tablet Commonly known as: ESTRACE Take 1 tablet (0.5 mg total) by mouth daily.   ezetimibe 10 MG tablet Commonly known as: ZETIA Take 1 tablet (10 mg total) by mouth daily.   levothyroxine 88 MCG tablet Commonly known as: SYNTHROID Take 1 tablet (88 mcg total) by mouth daily before breakfast.   liothyronine 5 MCG tablet Commonly known as: CYTOMEL Take 1 tablet (5 mcg total) by mouth daily.   Melatonin 5 MG Caps Take 5 mg by mouth at bedtime.   metoprolol succinate 25 MG 24 hr tablet Commonly known as: TOPROL-XL Take 25 mg by mouth daily.   nitroGLYCERIN 0.4 MG SL tablet Commonly known as: NITROSTAT Place 0.4 mg under the tongue as needed.   nystatin cream Commonly known as: MYCOSTATIN Apply 1 application topically 3 (three) times daily as needed.   ONE-A-DAY WOMENS 50+ ADVANTAGE PO Take by mouth daily.   Culturelle Probiotics + Multiv Chew Chew by mouth.   predniSONE 20 MG tablet Commonly known as: DELTASONE Take 2 tablets (40 mg total) by mouth daily with breakfast for 4 days, THEN 1 tablet (20 mg total) daily with breakfast for 4 days, THEN 0.5 tablets (10 mg total) daily with breakfast for 4 days. Start taking on: January 19, 2021   triamcinolone ointment 0.5 % Commonly known as: KENALOG Apply 1 application topically 2 (two) times daily.           OBJECTIVE:   PHYSICAL EXAM: VS: BP 132/88   Pulse 73   Ht 5\' 3"  (  1.6 m)   Wt 215 lb (97.5 kg)   SpO2 97%   BMI 38.09 kg/m    EXAM: General: Pt appears well and is in NAD  Neck: General: Supple without adenopathy. Thyroid: Thyroid size normal.  No goiter or nodules appreciated. N  Lungs: Clear with good BS bilat with no rales, rhonchi, or wheezes  Heart: Auscultation: RRR.  Abdomen: Normoactive bowel sounds, soft, nontender, without masses or organomegaly palpable  Extremities:  BL LE: No pretibial edema normal ROM and strength.  Mental Status:  Mood and affect: No depression, anxiety, or agitation     DATA REVIEWED:  Results for VIVIANNE, CARLES (MRN [id number]) as of 02/01/2021 16:45  Ref. Range 11/17/2020 09:34  TSH Latest Ref Range: 0.35 - 4.50 uIU/mL 2.13     ASSESSMENT / PLAN / RECOMMENDATIONS:   Hashimoto's Thyroiditis:   - She is clinically tired and attributes symptoms to recent COVID infection  - She would like to stop liothyronine  at this time, will increase Synthroid and recheck labs in 6 weeks    Medications : - Increase  Synthroid 88 mcg , to 1 tablet daily  - STOP  Liothyronine 5 mg daily   2. S/P left Lobectomy :   - S/P left lobectomy in 2009 secondary to Benign reasons - Per pt she has been getting annual ultrasounds, these records not available. - Will repeat ultrasound        F/U in 4 months   Signed electronically by: Samantha Guise, MD  [facility]  [facility] [address] [place], [place] [address] Phone: [phone] FAX: [phone]      CC: Lesleigh Noe, [place] [address] Phone: [phone]  Fax: [phone]   Return to Endocrinology clinic as below: Future Appointments  Date Time Provider [place]  01/31/2021  1:40 PM Romain Erion, Melanie Crazier, MD [facility] None  02/28/2021 11:20 AM [facility] 2 [facility]

## 2021-01-31 NOTE — Patient Instructions (Signed)
-   Stop Liothyronine  - Change Synthroid 88 mcg to 1 tablet daily

## 2021-02-01 DIAGNOSIS — Z961 Presence of intraocular lens: Secondary | ICD-10-CM | POA: Diagnosis not present

## 2021-02-01 DIAGNOSIS — H524 Presbyopia: Secondary | ICD-10-CM | POA: Diagnosis not present

## 2021-02-01 DIAGNOSIS — H43813 Vitreous degeneration, bilateral: Secondary | ICD-10-CM | POA: Diagnosis not present

## 2021-02-01 DIAGNOSIS — H04123 Dry eye syndrome of bilateral lacrimal glands: Secondary | ICD-10-CM | POA: Diagnosis not present

## 2021-02-19 ENCOUNTER — Telehealth: Payer: Self-pay

## 2021-02-19 DIAGNOSIS — M8580 Other specified disorders of bone density and structure, unspecified site: Secondary | ICD-10-CM

## 2021-02-19 NOTE — Telephone Encounter (Signed)
Prolia VOB due

## 2021-02-23 NOTE — Telephone Encounter (Signed)
Prolia VOB initiated via [email or web address]  Last OV: 01/19/21 Next OV:  Last Prolia inj: 09/28/20 Next Prolia inj DUE:  04/01/21

## 2021-02-28 ENCOUNTER — Other Ambulatory Visit: Payer: Self-pay

## 2021-02-28 ENCOUNTER — Ambulatory Visit
Admission: RE | Admit: 2021-02-28 | Discharge: 2021-02-28 | Disposition: A | Discharge status: Home / Self Care | Payer: Medicare HMO | Source: Ambulatory Visit | Attending: Family Medicine | Admitting: Family Medicine

## 2021-02-28 DIAGNOSIS — Z1231 Encounter for screening mammogram for malignant neoplasm of breast: Secondary | ICD-10-CM | POA: Diagnosis not present

## 2021-03-04 NOTE — Telephone Encounter (Signed)
PA initiated under Dr. Einar Pheasant via [email or web address]  Tresa Endo (Key: [id number])  Your information has been submitted to [facility]. [employer] will review the request and will issue a decision, typically within 3-7 days from your submission. You can check the updated outcome later by reopening this request.  If [employer] has not responded in 3-7 days or if you have any questions about your ePA request, please contact [employer] at [phone]. If you think there may be a problem with your PA request, use our live chat feature at the bottom right.  For [place] requests, please call [phone].

## 2021-03-05 NOTE — Telephone Encounter (Signed)
Pt ready for scheduling on or after 04/01/21  Out-of-pocket cost due at time of visit: $280  Primary: [employer] Prolia co-insurance: 20% ($255) Admin fee co-insurance: 20% ($25)  Secondary: n/a Prolia co-insurance:  Admin fee co-insurance:   Deductible: does not apply  Prior Auth: APPROVED PA# [id number] Valid:  09/08/20-07/16/21

## 2021-03-09 NOTE — Addendum Note (Signed)
Addended by: Kris Mouton on: 03/09/2021 04:13 PM   Modules accepted: Orders

## 2021-03-09 NOTE — Telephone Encounter (Signed)
Spoke with patient. Lab appointment on 03/10/21 and Nurse visit on 04/06/21. Patient gets RX sent to [facility] and pays $95. Will send this in closer to her nurse visit date, patient aware.

## 2021-03-09 NOTE — Telephone Encounter (Signed)
Left message for patient to call back  OOP cost $295

## 2021-03-09 NOTE — Telephone Encounter (Signed)
Pt called returning your phone call  

## 2021-03-10 ENCOUNTER — Other Ambulatory Visit: Payer: Self-pay

## 2021-03-10 ENCOUNTER — Other Ambulatory Visit (INDEPENDENT_AMBULATORY_CARE_PROVIDER_SITE_OTHER): Payer: Medicare HMO

## 2021-03-10 DIAGNOSIS — M8580 Other specified disorders of bone density and structure, unspecified site: Secondary | ICD-10-CM | POA: Diagnosis not present

## 2021-03-10 LAB — BASIC METABOLIC PANEL
BUN: 19 mg/dL (ref 6–23)
CO2: 28 mEq/L (ref 19–32)
Calcium: 9.4 mg/dL (ref 8.4–10.5)
Chloride: 103 mEq/L (ref 96–112)
Creatinine, Ser: 0.96 mg/dL (ref 0.40–1.20)
GFR: 60.25 mL/min (ref >60.00)
Glucose, Bld: 99 mg/dL (ref 70–99)
Potassium: 4.6 mEq/L (ref 3.5–5.1)
Sodium: 139 mEq/L (ref 135–145)

## 2021-03-14 ENCOUNTER — Other Ambulatory Visit: Payer: Self-pay

## 2021-03-14 ENCOUNTER — Other Ambulatory Visit (INDEPENDENT_AMBULATORY_CARE_PROVIDER_SITE_OTHER): Payer: Medicare HMO

## 2021-03-14 DIAGNOSIS — Z9009 Acquired absence of other part of head and neck: Secondary | ICD-10-CM | POA: Diagnosis not present

## 2021-03-14 LAB — TSH: TSH: 3.31 u[IU]/mL (ref 0.35–5.50)

## 2021-03-15 ENCOUNTER — Telehealth: Payer: Self-pay | Admitting: Endocrinology

## 2021-03-15 ENCOUNTER — Ambulatory Visit
Admission: RE | Admit: 2021-03-15 | Discharge: 2021-03-15 | Disposition: A | Discharge status: Home / Self Care | Payer: Medicare HMO | Source: Ambulatory Visit | Attending: Internal Medicine | Admitting: Internal Medicine

## 2021-03-15 DIAGNOSIS — Z9009 Acquired absence of other part of head and neck: Secondary | ICD-10-CM

## 2021-03-15 DIAGNOSIS — E890 Postprocedural hypothyroidism: Secondary | ICD-10-CM | POA: Diagnosis not present

## 2021-03-15 DIAGNOSIS — E049 Nontoxic goiter, unspecified: Secondary | ICD-10-CM | POA: Diagnosis not present

## 2021-03-15 NOTE — Telephone Encounter (Signed)
Mailed Medical Record Release Form to patient - per her request

## 2021-03-15 NOTE — Telephone Encounter (Signed)
CrCl 85.13 mL/min. Ok to proceed. Calcium normal 9.4 on 03/10/21

## 2021-03-28 MED ORDER — DENOSUMAB 60 MG/ML ~~LOC~~ SOSY: 60.0000 mg | PREFILLED_SYRINGE | Freq: Once | SUBCUTANEOUS | 0 refills | Status: AC

## 2021-03-28 NOTE — Addendum Note (Signed)
Addended by: Kris Mouton on: 03/28/2021 03:27 PM   Modules accepted: Orders

## 2021-03-29 ENCOUNTER — Ambulatory Visit (INDEPENDENT_AMBULATORY_CARE_PROVIDER_SITE_OTHER): Payer: Medicare HMO | Admitting: Family Medicine

## 2021-03-29 ENCOUNTER — Encounter: Payer: Self-pay | Admitting: Family Medicine

## 2021-03-29 ENCOUNTER — Other Ambulatory Visit: Payer: Self-pay

## 2021-03-29 VITALS — BP 118/78 | HR 74 | Temp 97.0°F | Ht 63.0 in | Wt 218.5 lb

## 2021-03-29 DIAGNOSIS — Z79890 Hormone replacement therapy (postmenopausal): Secondary | ICD-10-CM

## 2021-03-29 DIAGNOSIS — Z5181 Encounter for therapeutic drug level monitoring: Secondary | ICD-10-CM

## 2021-03-29 DIAGNOSIS — U099 Post covid-19 condition, unspecified: Secondary | ICD-10-CM | POA: Insufficient documentation

## 2021-03-29 DIAGNOSIS — R4189 Other symptoms and signs involving cognitive functions and awareness: Secondary | ICD-10-CM | POA: Insufficient documentation

## 2021-03-29 NOTE — Assessment & Plan Note (Signed)
Given her current post-covid symptoms discussed delaying decreasing estrogen given her persistent hot flashes and emotion changes. She can increase to alternating 1 mg and 0.5 mg estrace as needed to reduce symptoms. Will monitor to see if brain fog improves. Will reassess taper after improvement with covid-19.

## 2021-03-29 NOTE — Assessment & Plan Note (Addendum)
Do not suspect chronic fatigue given relatively good and high exercise tolerance. Advised continued rest as needed but watch and wait at this time. Brain fog per plan.

## 2021-03-29 NOTE — Patient Instructions (Addendum)
#  Estrogen - Ok to increase to 1 mg up to every other day and continue 0.5 mg   #covid  - I will check into post covid clinic and see about referral

## 2021-03-29 NOTE — Assessment & Plan Note (Signed)
Suspect 2/2 to covid infection given time course. Will send e-consult and if inadequate response will look into post-covid clinic

## 2021-03-29 NOTE — Progress Notes (Signed)
Subjective:     Samantha Perkins is a 70 y.o. female presenting for Advice Only (estrogen)     HPI   #Estrogen replacement - continues to have hot flashes - several times a week, will do fan/take covers off  - also more emotional - has been on estrace 0.5 mg daily   #post-covid - had covid memorial day weekend - sleeping more  - 10 hours - brain fog persists - memory issues - did paxlovid - having word finding issues - doing more memory things - wondering about diet related things - is exercising -  45-60 minutes on a recumbent bike and water aerobics - overall this is a positive impact on her fatigue - has started gaining weight again - eating healthy and some weight gain   Review of Systems  01/19/2021: Clinic - Estrogen weaning w/ some hot flashes. Still working to wean down to 0.5 mg estrace  Social History   Tobacco Use  Smoking Status Never  Smokeless Tobacco Never        Objective:    BP Readings from Last 3 Encounters:  03/29/21 118/78  01/31/21 132/88  01/19/21 100/64   Wt Readings from Last 3 Encounters:  03/29/21 218 lb 8 oz (99.1 kg)  01/31/21 215 lb (97.5 kg)  01/19/21 212 lb 12 oz (96.5 kg)    BP 118/78   Pulse 74   Temp (!) 97 F (36.1 C) (Temporal)   Ht '5\' 3"'$  (1.6 m)   Wt 218 lb 8 oz (99.1 kg)   SpO2 98%   BMI 38.71 kg/m    Physical Exam Constitutional:      General: She is not in acute distress.    Appearance: She is well-developed. She is not diaphoretic.  HENT:     Right Ear: External ear normal.     Left Ear: External ear normal.     Nose: Nose normal.  Eyes:     Conjunctiva/sclera: Conjunctivae normal.  Cardiovascular:     Rate and Rhythm: Normal rate and regular rhythm.     Heart sounds: No murmur heard. Pulmonary:     Effort: Pulmonary effort is normal. No respiratory distress.     Breath sounds: Normal breath sounds. No wheezing.  Musculoskeletal:     Cervical back: Neck supple.  Skin:    General: Skin is warm  and dry.     Capillary Refill: Capillary refill takes less than 2 seconds.  Neurological:     Mental Status: She is alert. Mental status is at baseline.  Psychiatric:        Mood and Affect: Mood normal.        Behavior: Behavior normal.          Assessment & Plan:   Problem List Items Addressed This Visit       Other   Encounter for monitoring postmenopausal estrogen replacement therapy - Primary    Given her current post-covid symptoms discussed delaying decreasing estrogen given her persistent hot flashes and emotion changes. She can increase to alternating 1 mg and 0.5 mg estrace as needed to reduce symptoms. Will monitor to see if brain fog improves. Will reassess taper after improvement with covid-19.       Brain fog    Suspect 2/2 to covid infection given time course. Will send e-consult and if inadequate response will look into post-covid clinic      Post covid-19 condition, unspecified    Do not suspect chronic fatigue given relatively  good and high exercise tolerance. Advised continued rest as needed but watch and wait at this time. Brain fog per plan.       I spent >25 minutes with pt , obtaining history, examining, reviewing chart, documenting encounter and discussing the above plan of care.   Return if symptoms worsen or fail to improve.  Lesleigh Noe, MD  This visit occurred during the SARS-CoV-2 public health emergency.  Safety protocols were in place, including screening questions prior to the visit, additional usage of staff PPE, and extensive cleaning of exam room while observing appropriate contact time as indicated for disinfecting solutions.

## 2021-03-30 ENCOUNTER — Encounter: Payer: Self-pay | Admitting: Family Medicine

## 2021-04-01 DIAGNOSIS — L821 Other seborrheic keratosis: Secondary | ICD-10-CM | POA: Diagnosis not present

## 2021-04-01 DIAGNOSIS — L304 Erythema intertrigo: Secondary | ICD-10-CM | POA: Diagnosis not present

## 2021-04-01 DIAGNOSIS — D2372 Other benign neoplasm of skin of left lower limb, including hip: Secondary | ICD-10-CM | POA: Diagnosis not present

## 2021-04-06 ENCOUNTER — Ambulatory Visit (INDEPENDENT_AMBULATORY_CARE_PROVIDER_SITE_OTHER): Payer: Medicare HMO

## 2021-04-06 ENCOUNTER — Other Ambulatory Visit: Payer: Self-pay

## 2021-04-06 ENCOUNTER — Ambulatory Visit: Payer: Medicare HMO

## 2021-04-06 DIAGNOSIS — M8580 Other specified disorders of bone density and structure, unspecified site: Secondary | ICD-10-CM

## 2021-04-06 MED ORDER — DENOSUMAB 60 MG/ML ~~LOC~~ SOSY
60.0000 mg | PREFILLED_SYRINGE | Freq: Once | SUBCUTANEOUS | Status: AC
  Administered 2021-04-06: 60 mg via SUBCUTANEOUS

## 2021-04-06 NOTE — Progress Notes (Addendum)
Per orders of Alma Friendly in Dr. Verda Cumins absence, injection of Prolia was given in the Left Deltoid by Ophelia Shoulder. Patient provided Prolia for her injection. Patient tolerated injection well.

## 2021-05-04 IMAGING — MR MR ANKLE*L* W/O CM
5 series · 37 of 40 positions shown · non-contrast
Comparison: None.

CLINICAL DATA: Left ankle pain

EXAM:
MRI OF THE LEFT ANKLE WITHOUT CONTRAST
TECHNIQUE: Multiplanar, multisequence MR imaging of the ankle was performed. No
intravenous contrast was administered.

[Series 4: T2 fat-sat · axial · 3.0mm · 0.50mm/px · z∈[-77,+43]mm · 8 of 32 slices shown (1 of 2)]
[im 1/32]
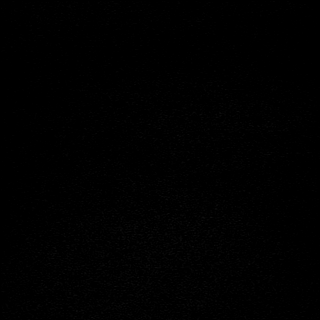
[im 4/32]
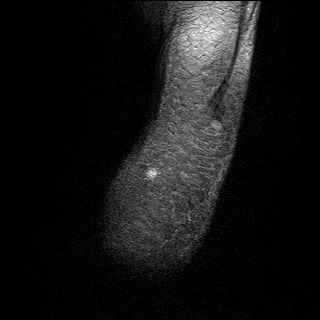
[im 11/32]
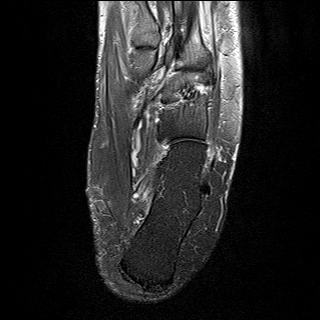
[im 14/32]
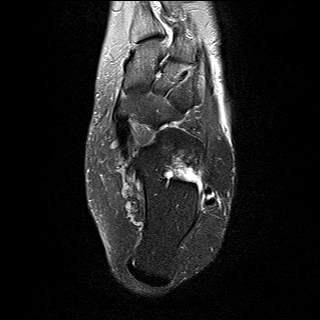
[im 18/32]
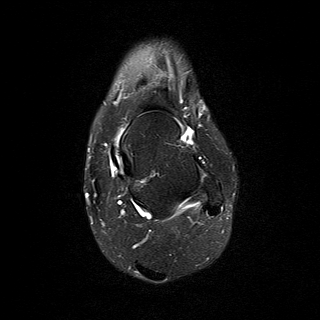
[im 21/32]
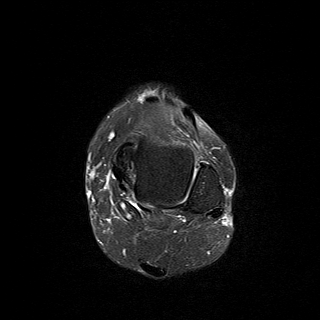
[im 28/32]
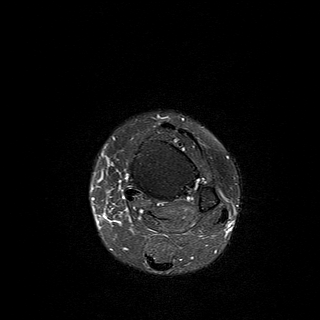
[im 32/32]
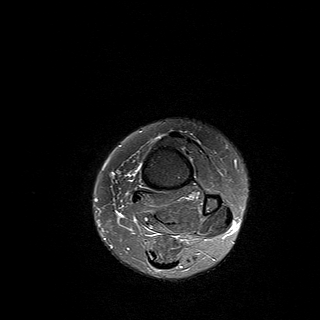

[Series 5: PD fat-sat · axial · 3.0mm · 0.42mm/px · z∈[-77,+43]mm · 9 of 32 slices shown]
[im 1/32]
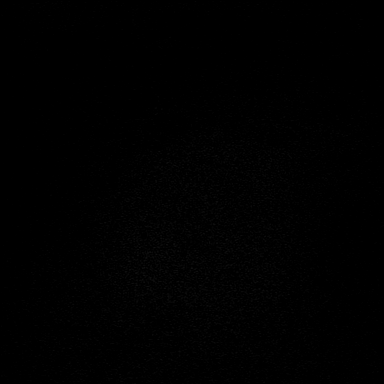
[im 4/32]
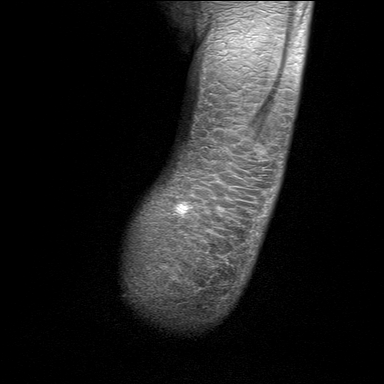
[im 8/32]
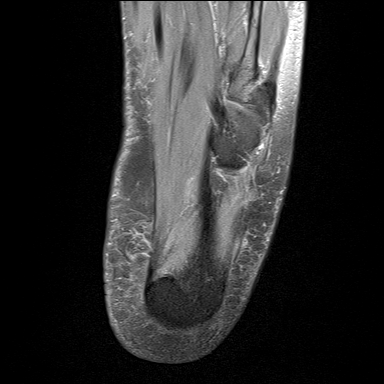
[im 12/32]
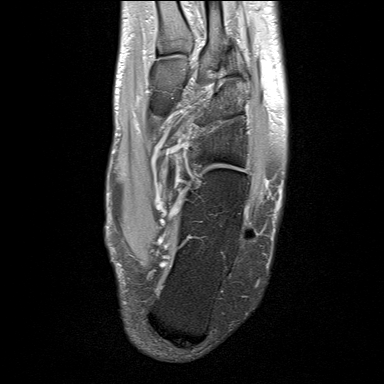
[im 16/32]
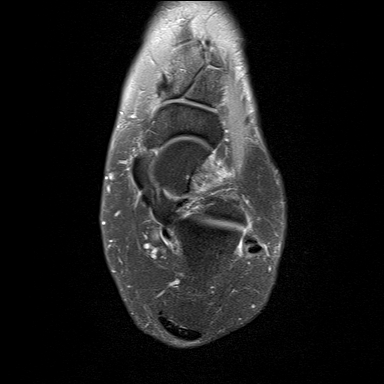
[im 20/32]
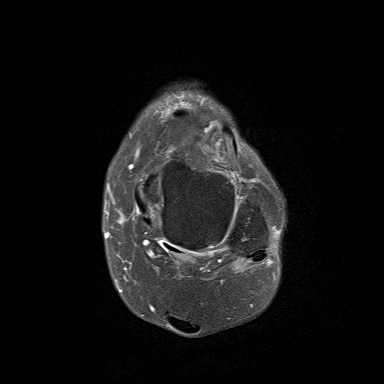
[im 24/32]
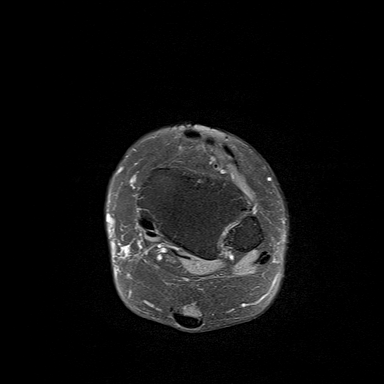
[im 28/32]
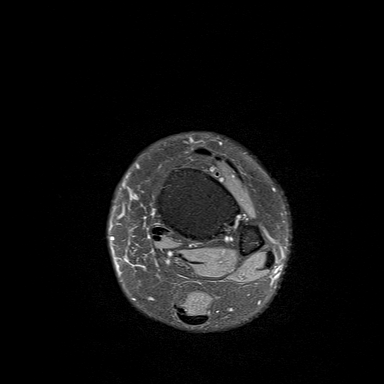
[im 32/32]
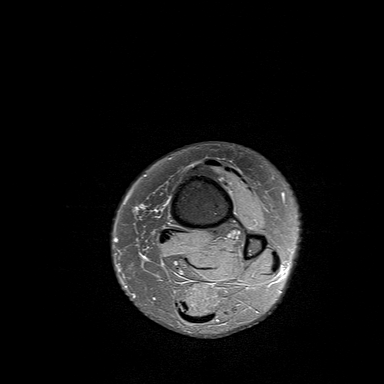

[Series 6: T1 · sagittal · 4.0mm · 0.56mm/px · 6 of 20 slices shown]
[im 1/20]
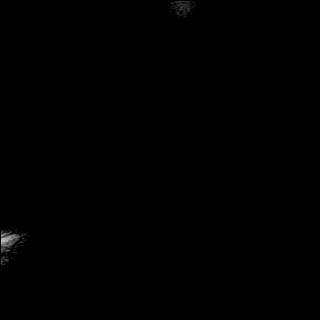
[im 4/20]
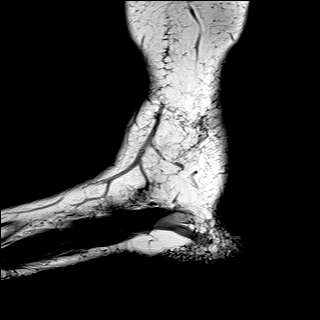
[im 8/20]
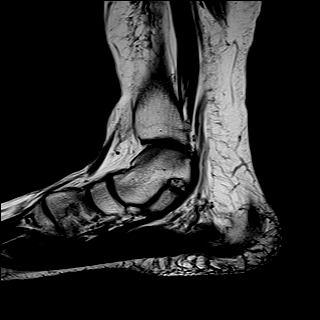
[im 12/20]
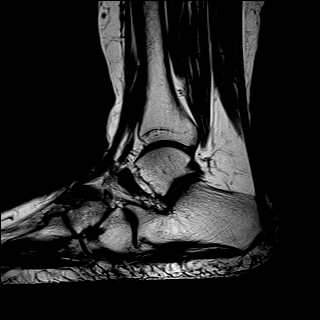
[im 16/20]
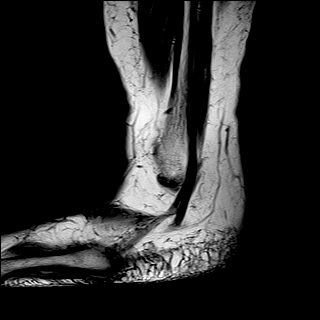
[im 20/20]
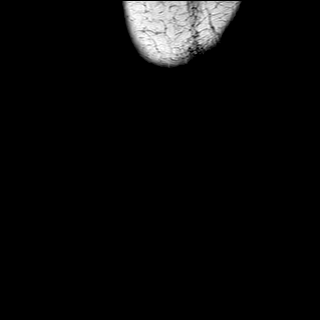

[Series 7: STIR · sagittal · 4.0mm · 0.35mm/px · 5 of 20 slices shown]
[im 1/20]
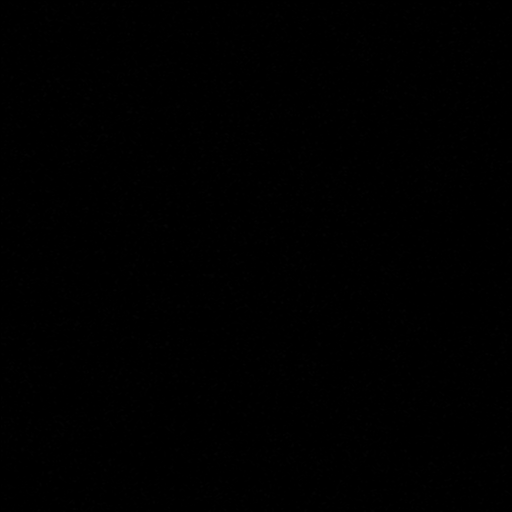
[im 4/20]
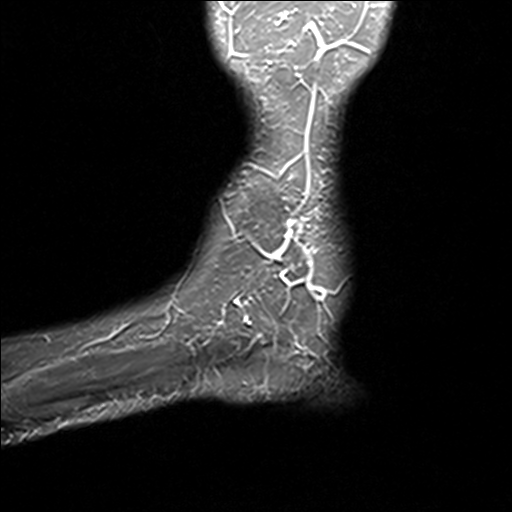
[im 8/20]
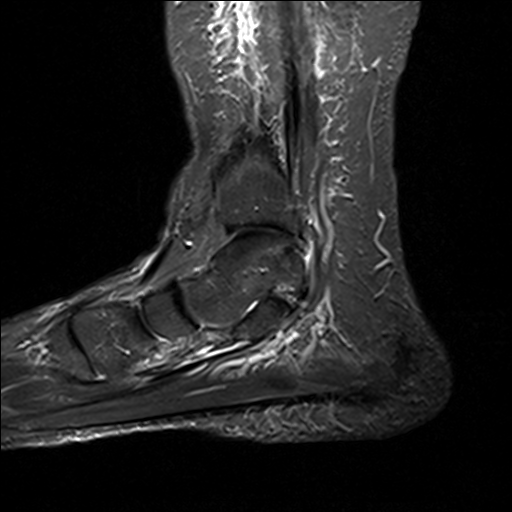
[im 12/20]
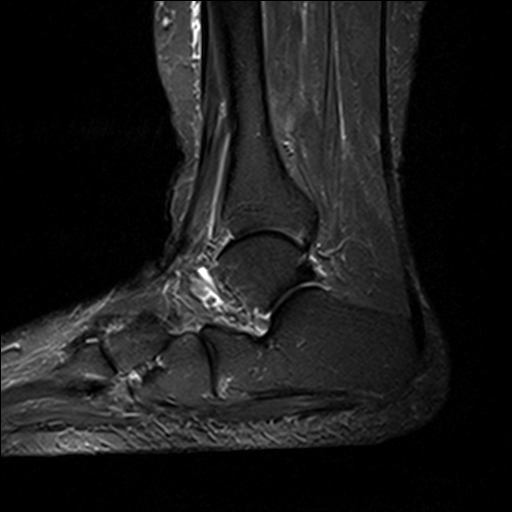
[im 16/20]
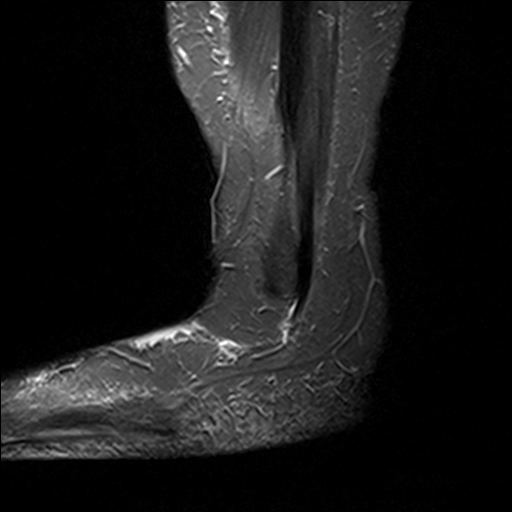

[Series 8: T2 fat-sat · coronal · 3.0mm · 0.50mm/px · 9 of 32 slices shown (2 of 2)]
[im 1/32]
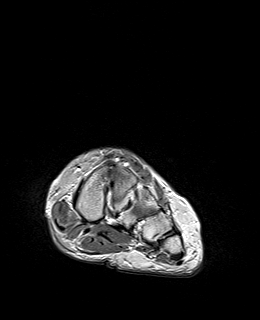
[im 4/32]
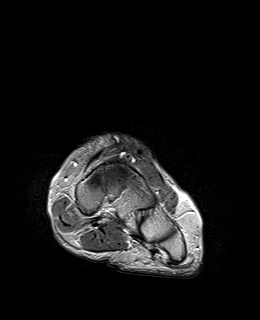
[im 8/32]
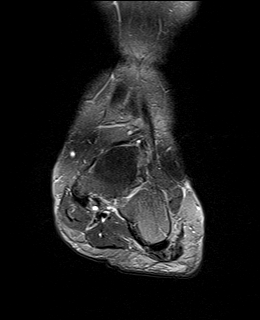
[im 12/32]
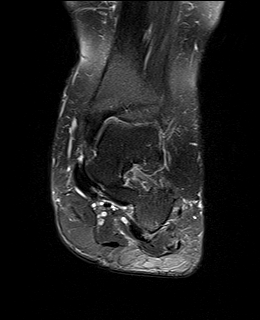
[im 16/32]
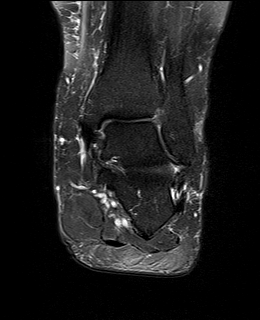
[im 20/32]
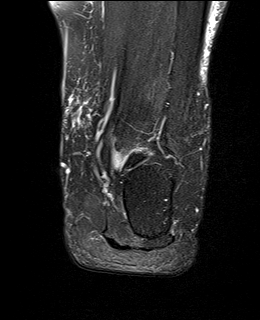
[im 24/32]
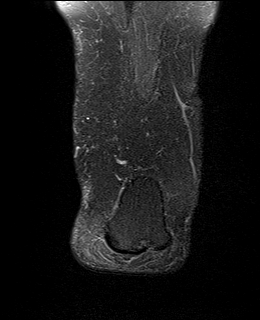
[im 28/32]
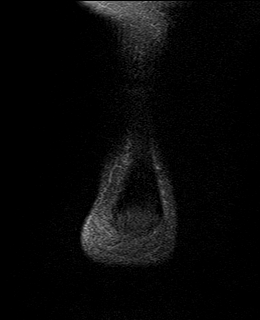
[im 32/32]
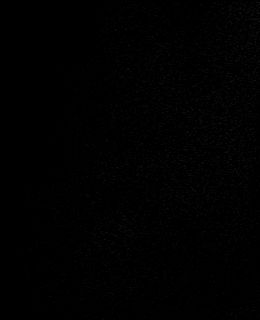

[37 of 40 positions shown; findings below may reference images not displayed]

FINDINGS: TENDONS

Peroneal: Intact peroneus longus and peroneus brevis tendons.

Posteromedial: Intact tibialis posterior, flexor hallucis longus and
flexor digitorum longus tendons.

Anterior: Intact tibialis anterior, extensor hallucis longus and
extensor digitorum longus tendons.

Achilles: Intact.

Plantar Fascia: Intact.

LIGAMENTS

Lateral: The anterior and posterior tibiofibular ligaments are
intact. The anterior and posterior talofibular ligaments are intact.
Intact calcaneofibular ligament.

Medial: Deltoid ligament and spring ligament complex intact.

CARTILAGE

Ankle Joint: No joint effusion or chondral defect.

Subtalar Joints/Sinus Tarsi: No joint effusion or chondral defect.
Preservation of the anatomic fat within the sinus tarsi.

Bones: Pes planus alignment. Minimal residual marrow edema at site
of tiny nondisplaced cortical avulsion at the fifth metatarsal base
(series 4, image 27). Elsewhere, no evidence of fracture. No
dislocation. Bidirectional calcaneal enthesophytes. No significant
arthropathy. No joint effusions. No suspicious bone lesion.

Other: No fluid collections.
IMPRESSION: 1. Minimal residual marrow edema at site of subacute fifth
metatarsal base avulsion fracture.
2. Pes planus alignment.
3. Intact tendons and ligaments of the ankle. Specifically, no
peroneal tendinopathy, tear, or tenosynovitis.

## 2021-06-03 ENCOUNTER — Encounter: Payer: Self-pay | Admitting: Internal Medicine

## 2021-06-03 ENCOUNTER — Ambulatory Visit: Payer: Medicare HMO | Admitting: Internal Medicine

## 2021-06-03 ENCOUNTER — Other Ambulatory Visit: Payer: Self-pay

## 2021-06-03 VITALS — BP 124/82 | HR 80 | Ht 63.0 in | Wt 218.0 lb

## 2021-06-03 DIAGNOSIS — E063 Autoimmune thyroiditis: Secondary | ICD-10-CM

## 2021-06-03 DIAGNOSIS — Z9009 Acquired absence of other part of head and neck: Secondary | ICD-10-CM | POA: Diagnosis not present

## 2021-06-03 DIAGNOSIS — R635 Abnormal weight gain: Secondary | ICD-10-CM

## 2021-06-03 DIAGNOSIS — E038 Other specified hypothyroidism: Secondary | ICD-10-CM | POA: Diagnosis not present

## 2021-06-03 LAB — TSH: TSH: 4.33 u[IU]/mL (ref 0.35–5.50)

## 2021-06-03 MED ORDER — LEVOTHYROXINE SODIUM 100 MCG PO TABS: 100.0000 ug | ORAL_TABLET | Freq: Every day | ORAL | 3 refills | Status: DC

## 2021-06-03 NOTE — Progress Notes (Signed)
Name: Samantha Perkins  MRN/ DOB: [id number], Mar 03, 1951    Age/ Sex: 70 y.o., female     PCP: Lesleigh Noe, MD   Reason for Endocrinology Evaluation: Hashimoto's Thyroiditis and MNG     Initial Endocrinology Clinic Visit: 09/17/2020    PATIENT IDENTIFIER: Samantha Perkins is a 70 y.o., female with a past medical history of Hashimoto's thyroiditis, Osteopenia, Dyslipidemia and CAD . She has followed with [facility] since 09/17/2020 for consultative assistance with management of her Hashimoto's Thyroiditis and MNG  HISTORICAL SUMMARY:  Moved from [place]   She was diagnosed with hypothyroidism in 2008 S/P left Lobectomy in 2009 for benign thyroid  Nodule nodule , was started on LT-4 replacement in 2009 A few years later Liothyronine was added due to sleep issues     Last thyroid ultrasound 03/2020, no new nodules per pt .   Has hx of several stress fractures with osteopenia for years. She has been on Actonel and Boniva , as of recent has been on Prolia ~ 3 yrs now She is on calcium 600 mg and Vitamin D through [facility]   Of note the pt had  MI in 2007, cardiac cath was unsucceful - follows with Dr. Marisue Ivan    We stopped Liothyronine 01/2021 per pt request    SUBJECTIVE:     Today (06/03/2021):  Ms. Starry is here for a follow up on postoperative hypothyroidism    Weight continues to fluctuate but she has noted weight gain   Denies constipation or diarrhea  Denies palpitations  Denies palpitations  Denies local neck symptoms   Synthroid 88 mcg ,daily    HISTORY:  Past Medical History:  Past Medical History:  Diagnosis Date   Carotid artery occlusion    Coronary artery disease    Distal LCX MI 2007 in [place]. PCI unable to be performed.    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 She has never used smokeless tobacco. She reports current alcohol use. She reports that she does not use drugs. Family History:  Family History  Problem Relation Age of Onset   Heart failure Mother    Hypothyroidism Mother    Aneurysm Mother    Stroke Mother    Mitral valve prolapse Mother    Lung cancer Mother        smoker   Arthritis Father    Heart disease Father    Heart murmur Father    Diabetes Father    Hypertension Father    COPD Father    Diabetes Sister    Bladder Cancer Sister    Cataracts Sister    Arthritis Sister    Osteopenia Sister    Irritable bowel syndrome Sister    Mitral valve prolapse Sister    Fibroids  Sister    Lung cancer Sister        smoker   Mitral valve prolapse Sister    Hypertension Sister    Bradycardia Sister        w/ pacemaker   Crohn's disease Sister    Stroke Sister    Liver cancer Sister    Colon cancer Maternal Uncle        3 uncles    Pancreatic cancer Maternal Uncle        2 uncles with both colon and pancreatic   Diabetes Maternal Grandmother    Breast cancer Neg Hx      HOME MEDICATIONS: Allergies as of 06/03/2021       Reactions   Cymbalta [duloxetine Hcl] Rash   Duloxetine    Latex    Other reaction(s): Blisters   Wound Dressing Adhesive Other (See Comments)        Medication List        Accurate as of June 03, 2021  9:15 AM. If you have any questions, ask your nurse or doctor.          aspirin EC 81 MG tablet Take 81 mg by mouth daily. Swallow whole.   atorvastatin 20 MG tablet Commonly known as: LIPITOR Take 1 tablet (20 mg  total) by mouth daily.   azelastine 0.1 % nasal spray Commonly known as: ASTELIN as needed.   cetirizine 10 MG tablet Commonly known as: ZyrTEC Allergy Take 1 tablet (10 mg total) by mouth daily. What changed:  when to take this reasons to take this   clobetasol 0.05 % Gel Commonly known as: TEMOVATE Apply topically 2 (two) times daily.   denosumab 60 MG/ML Sosy injection Commonly known as: PROLIA Inject 60 mg into the skin every 6 (six) months.   desonide 0.05 % cream Commonly known as: DESOWEN Apply topically as needed.   diclofenac 0.1 % ophthalmic solution Commonly known as: VOLTAREN 4 (four) times daily.   estradiol 0.5 MG tablet Commonly known as: ESTRACE Take 1 tablet (0.5 mg total) by mouth daily.   ezetimibe 10 MG tablet Commonly known as: ZETIA Take 1 tablet (10 mg total) by mouth daily.   levothyroxine 88 MCG tablet Commonly known as: SYNTHROID Take 1 tablet (88 mcg total) by mouth daily before breakfast.   Melatonin 5 MG Caps Take 5 mg by mouth at bedtime.   metoprolol succinate 25 MG 24 hr tablet Commonly known as: TOPROL-XL Take 25 mg by mouth daily.   nitroGLYCERIN 0.4 MG SL tablet Commonly known as: NITROSTAT Place 0.4 mg under the tongue as needed.   nystatin cream Commonly known as: MYCOSTATIN Apply 1 application topically 3 (three) times daily as needed.   ONE-A-DAY WOMENS 50+ ADVANTAGE PO Take by mouth daily.   Culturelle Probiotics + Multiv Chew Chew by mouth.          OBJECTIVE:   PHYSICAL EXAM: VS: BP 124/82 (BP Location: Left Arm, Patient Position: Sitting, Cuff Size: Small)   Pulse 80   Ht 5\' 3"  (1.6 m)   Wt 218 lb (98.9 kg)   SpO2 94%   BMI 38.62 kg/m    EXAM: General: Pt appears well and is in NAD  Neck: General: Supple without adenopathy. Thyroid: Thyroid size normal.  No goiter or nodules appreciated. N  Lungs: Clear with good BS bilat with no rales, rhonchi, or wheezes  Heart: Auscultation: RRR.   Abdomen: Normoactive bowel sounds, soft, nontender, without masses or organomegaly palpable  Extremities:  BL LE: No pretibial edema normal ROM and strength.  Mental Status:  Mood and affect: No depression, anxiety, or agitation     DATA REVIEWED:  Latest Reference Range & Units 06/03/21 09:26  TSH 0.35 - 5.50 uIU/mL 4.33    ASSESSMENT / PLAN / RECOMMENDATIONS:   Hashimoto's Thyroiditis:   -Patient has been noted with weight gain -TSH is> 4.0 you IU/mL -We will change Synthroid as below    Medications : Stop Synthroid 88 mcg,  1 tablet daily  Start Synthroid 100 MCG daily   2. S/P left Lobectomy :   - S/P left lobectomy in 2009 secondary to Benign reasons - Per pt she has been getting annual ultrasounds, these records not available. - Repeat Ultrasound showed heterogenous gland without cysts or nodules  -Reassurance provided at this point -We will continue to monitor clinically and have low threshold for repeat ultrasound   3. Weight Gain :   - Discussed following a certain diet such as weight watcehrs, or noom  -She already exercises, she was encouraged to continue - She would like to hold off on a referral to a weight loss clinic     F/U in 6 months  Labs in 8 weeks  Signed electronically by: Mack Guise, MD  [facility]  [facility] [address] [place], [place] [address] Phone: [phone] FAX: [phone]      CC: Lesleigh Noe, [place] [address] Phone: [phone]  Fax: [phone]   Return to Endocrinology clinic as below: Future Appointments  Date Time Provider [place]  09/06/2021  9:40 AM O'Neal, Cassie Freer, MD [facility]  10/05/2021  2:30 PM [facility] NURSE [facility]

## 2021-06-06 ENCOUNTER — Telehealth: Payer: Self-pay | Admitting: Internal Medicine

## 2021-06-06 NOTE — Telephone Encounter (Signed)
Patient is calling to say that today she picked up her prescription for Synthroid and found out that it was for generic and in the past she was told NOT to get the generic form of Synthroid.levothyroxine (SYNTHROID) 100 MCG tabletPreferred [place], [place] Phone:  [phone]  Fax:  [phone]

## 2021-06-07 MED ORDER — SYNTHROID 100 MCG PO TABS: 100.0000 ug | ORAL_TABLET | Freq: Every day | ORAL | 3 refills | Status: DC

## 2021-06-07 NOTE — Telephone Encounter (Signed)
Brand has been sent to pharmacy

## 2021-06-08 ENCOUNTER — Telehealth: Payer: Self-pay | Admitting: Internal Medicine

## 2021-06-13 NOTE — Telephone Encounter (Signed)
Patient would like to try the generic and will give a call if that doesn't work

## 2021-06-13 NOTE — Telephone Encounter (Signed)
PT called again and wish to speak with the nurse concerning BRAND name compared to [facility] Synthroid and the difference. She can be reached at [phone].

## 2021-06-17 DIAGNOSIS — H52209 Unspecified astigmatism, unspecified eye: Secondary | ICD-10-CM | POA: Diagnosis not present

## 2021-06-17 DIAGNOSIS — H524 Presbyopia: Secondary | ICD-10-CM | POA: Diagnosis not present

## 2021-06-17 DIAGNOSIS — H5213 Myopia, bilateral: Secondary | ICD-10-CM | POA: Diagnosis not present

## 2021-07-04 ENCOUNTER — Other Ambulatory Visit: Payer: Self-pay | Admitting: Family Medicine

## 2021-07-04 DIAGNOSIS — Z5181 Encounter for therapeutic drug level monitoring: Secondary | ICD-10-CM

## 2021-07-05 ENCOUNTER — Other Ambulatory Visit: Payer: Self-pay | Admitting: Family Medicine

## 2021-07-05 DIAGNOSIS — Z5181 Encounter for therapeutic drug level monitoring: Secondary | ICD-10-CM

## 2021-07-05 NOTE — Telephone Encounter (Signed)
°  Encourage patient to contact the pharmacy for refills or they can request refills through [place]:  Please schedule appointment if longer than 1 year  NEXT APPOINTMENT DATE:  MEDICATION:estradiol (ESTRACE) 0.5 MG tablet   Is the patient out of medication?   [place], [place] -    Let patient know to contact pharmacy at the end of the day to make sure medication is ready.  Please notify patient to allow 48-72 hours to process  CLINICAL FILLS OUT ALL BELOW:   LAST REFILL:  QTY:  REFILL DATE:    OTHER COMMENTS:    Okay for refill?  Please advise

## 2021-07-06 NOTE — Telephone Encounter (Signed)
Name of Medication: Estrace 0.5 mg Name of Pharmacy: [facility] or Written Date and Quantity: #90 x 1 on 01/19/21 Last Office Visit and Type: 03/29/21 was noted OK to increase to Estrace 1 mg up to every other day and continue 0.5 mg.  It was also noted under assessment and plan note on 03/29/21  by Dr Einar Pheasant Given her current post-covid symptoms discussed delaying decreasing estrogen given her persistent hot flashes and emotion changes. She can increase to alternating 1 mg and 0.5 mg estrace as needed to reduce symptoms. Will monitor to see if brain fog improves. Will reassess taper after improvement with covid-19.      On med list has  Estrace 0.5 mg taking one tab PO daily.  Next Office Visit and Type: none scheduled.  Sending note to Gentry Fitz NP to review prior to refill.

## 2021-07-07 MED ORDER — ESTRADIOL 0.5 MG PO TABS: 0.5000 mg | ORAL_TABLET | Freq: Every day | ORAL | 0 refills | Status: DC

## 2021-07-07 NOTE — Telephone Encounter (Signed)
Will defer dose changes to PCP upon her return. Refill(s) sent to pharmacy.

## 2021-07-19 ENCOUNTER — Other Ambulatory Visit: Payer: Self-pay | Admitting: *Deleted

## 2021-07-19 MED ORDER — METOPROLOL SUCCINATE ER 25 MG PO TB24: 25.0000 mg | ORAL_TABLET | Freq: Every day | ORAL | 1 refills | Status: DC

## 2021-08-02 DIAGNOSIS — M25512 Pain in left shoulder: Secondary | ICD-10-CM | POA: Diagnosis not present

## 2021-08-03 ENCOUNTER — Other Ambulatory Visit: Payer: Self-pay

## 2021-08-03 ENCOUNTER — Other Ambulatory Visit (INDEPENDENT_AMBULATORY_CARE_PROVIDER_SITE_OTHER): Payer: Medicare HMO

## 2021-08-03 DIAGNOSIS — E038 Other specified hypothyroidism: Secondary | ICD-10-CM | POA: Diagnosis not present

## 2021-08-03 DIAGNOSIS — E063 Autoimmune thyroiditis: Secondary | ICD-10-CM

## 2021-08-03 LAB — TSH: TSH: 1.15 u[IU]/mL (ref 0.35–5.50)

## 2021-09-04 NOTE — Progress Notes (Signed)
Cardiology Office Note:   Date:  09/06/2021  NAME:  Samantha Perkins    MRN: [id number] DOB:  30-Dec-1950   PCP:  Lesleigh Noe, MD  Cardiologist:  None  Electrophysiologist:  None   Referring MD: Lesleigh Noe, MD   Chief Complaint  Patient presents with   Follow-up         History of Present Illness:   Samantha Perkins is a 71 y.o. female with a hx of CAD, hypertension, hyperlipidemia who presents for follow-up.  She reports she is doing well.  Denies any chest pain episodes.  She has gained 60 pounds at the last visit.  Reports she is tired.  We discussed getting more active.  She does express a desire to get on weight loss medications.  I do recommend HWEXHB.  Cholesterol was well controlled last year.  On Lipitor 20 mg daily as well as Zetia 10 mg daily.  Reports occasional cramping.  We discussed decreasing Lipitor.  For now she would like to continue at the current dose.  No episodes of chest pain reported.  She reports occasional palpitations.  2 in the last 1 year.  Symptoms can last minutes.  Resolved with Valsalva.  We discussed taking an extra metoprolol as needed.  She is not diabetic.  Her blood pressure is well controlled.  All of her other risk is very well controlled.  Her husband was diagnosed with Parkinson disease.  Working with neurology on that.  She reports she is motivated to exercise and lose weight.  I have encouraged.  Problem List 1. CAD -Small inferior infarct 01/28/2006 with totally occluded distal circumflex was unsuccessful angioplasty [facility]) -Cardiac PET 04/14/2020 ([place]): Large, fixed perfusion defect of the lateral wall, no ischemia, findings consistent with prior MI, EF 59% 2. HLD -T chol 128, HDL 56, LDL 53, TG 107 3. Hypothyroidism  4. Osteopenia 5.  PACs -Treated with metoprolol 6.  Carotid artery disease -40 to 59% left ICA (2020; [place]) -no disease 09/08/2020  Past Medical History: Past Medical History:   Diagnosis Date   Carotid artery occlusion    Coronary artery disease    Distal LCX MI 2007 in [place]. PCI unable to be performed.    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 Swallow whole.   atorvastatin (LIPITOR) 20 MG tablet Take 1 tablet (20 mg total) by mouth daily.   azelastine (ASTELIN) 0.1 % nasal spray as needed.   BAYER ASPIRIN PO Take 81 mg by mouth daily.   cetirizine (ZYRTEC ALLERGY) 10 MG tablet Take 1 tablet (10 mg total) by mouth daily. (Patient taking differently: Take 10 mg by mouth as needed.)   clobetasol (TEMOVATE) 0.05 % GEL Apply  topically 2 (two) times daily.   denosumab (PROLIA) 60 MG/ML SOSY injection Inject 60 mg into the skin every 6 (six) months.   desonide (DESOWEN) 0.05 % cream Apply topically as needed.   diclofenac Sodium (VOLTAREN) 1 % GEL Voltaren Arthritis Pain 1 % topical gel   estradiol (ESTRACE) 0.5 MG tablet Take 1 tablet (0.5 mg total) by mouth daily.   ezetimibe (ZETIA) 10 MG tablet Take 1 tablet (10 mg total) by mouth daily.   L-Lysine 500 MG TABS L-Lysine 500 mg tablet   Lactobacillus-Inulin (CULTURELLE ADULT ULT BALANCE PO) Culturelle   levothyroxine (SYNTHROID) 100 MCG tablet Take 1 tablet (100 mcg total) by mouth daily.   Melatonin 5 MG CAPS Take 5 mg by mouth at bedtime.   metoprolol succinate (TOPROL-XL) 25 MG 24 hr tablet Take 1 tablet (25 mg total) by mouth  daily.   Multiple Vitamins-Minerals (CULTURELLE PROBIOTICS + MULTIV) CHEW Chew by mouth.   Multiple Vitamins-Minerals (ONE-A-DAY WOMENS 50+ ADVANTAGE PO) Take by mouth daily.   nitroGLYCERIN (NITROSTAT) 0.4 MG SL tablet Place 0.4 mg under the tongue as needed.   nystatin cream (MYCOSTATIN) Apply 1 application topically 3 (three) times daily as needed.   [DISCONTINUED] diclofenac (VOLTAREN) 0.1 % ophthalmic solution 4 (four) times daily.     Allergies:    Cymbalta [duloxetine hcl], Duloxetine, Latex, and Wound dressing adhesive   Social History: Social History   Socioeconomic History   Marital status: Married    Spouse name: Hiram Comber   Number of children: 0   Years of education: master's degree   Highest education level: Not on file  Occupational History   Occupation: retired Pharmacist, hospital  Tobacco Use   Smoking status: Never   Smokeless tobacco: Never  Vaping Use   Vaping Use: Never used  Substance and Sexual Activity   Alcohol use: Yes    Comment: twice a week, 1 drink   Drug use: Never   Sexual activity: Not Currently    Birth control/protection: Surgical  Other Topics Concern   Not on file  Social History Narrative   07/26/20   From: [place], moved to be near children   Living: with husband, Hiram Comber (1980)   Work: retired - Lexicographer      Family: step daughter - Product manager - 2 step granddaughters - in the area      Enjoys: Environmental education officer, explore new places, reading, volunteering       Exercise: not currently, joining the [employer]   Diet: tries to eat healthy, not as good as she did before      Safety   Seat belts: Yes    Guns: No   Safe in relationships: Yes    Social Determinants of Radio broadcast assistant Strain: Not on file  Food Insecurity: Not on file  Transportation Needs: Not on file  Physical Activity: Not on file  Stress: Not on file  Social Connections: Not on file     Family History: The patient's  family history includes Aneurysm in her mother; Arthritis in her father and sister; Bladder Cancer in her sister; Bradycardia in her sister; COPD in her father; Cataracts in her sister; Colon cancer in her maternal uncle; Crohn's disease in her sister; Diabetes in her father, maternal grandmother, and sister; Fibroids in her sister; Heart disease in her father; Heart failure in her mother; Heart murmur in her father; Hypertension in her father and sister; Hypothyroidism in her mother; Irritable bowel syndrome  in her sister; Liver cancer in her sister; Lung cancer in her mother and sister; Mitral valve prolapse in her mother, sister, and sister; Osteopenia in her sister; Pancreatic cancer in her maternal uncle; Stroke in her mother and sister. There is no history of Breast cancer.  ROS:   All other ROS reviewed and negative. Pertinent positives noted in the HPI.     EKGs/Labs/Other Studies Reviewed:   The following studies were personally reviewed by me today:  Vasc US Carotid 09/08/2020  Summary:  Right Carotid: There is no evidence of stenosis in the right ICA.   Left Carotid: There is no evidence of stenosis in the left ICA.   Recent Labs: 11/17/2020: Magnesium 2.0 03/10/2021: BUN 19; Creatinine, Ser 0.96; Potassium 4.6; Sodium 139 08/03/2021: TSH 1.15   Recent Lipid Panel    Component Value Date/Time   CHOL 128 11/09/2020 0911   TRIG 107 11/09/2020 0911   HDL 56 11/09/2020 0911   CHOLHDL 2.3 11/09/2020 0911   CHOLHDL 3 07/26/2020 1610   VLDL 52.2 (H) 07/26/2020 1610   LDLCALC 53 11/09/2020 0911   LDLDIRECT 91 09/06/2020 1001   LDLDIRECT 73.0 07/26/2020 1610    Physical Exam:   VS:  BP 122/78    Pulse 73    Ht 5\' 3"  (1.6 m)    Wt 222 lb 12.8 oz (101.1 kg)    SpO2 96%    BMI 39.47 kg/m    Wt Readings from Last 3 Encounters:  09/06/21 222 lb 12.8 oz (101.1 kg)  06/03/21 218 lb (98.9 kg)  03/29/21 218 lb 8 oz (99.1 kg)    General: Well nourished, well developed, in no acute  distress Head: Atraumatic, normal size  Eyes: PEERLA, EOMI  Neck: Supple, no JVD Endocrine: No thryomegaly Cardiac: Normal S1, S2; RRR; no murmurs, rubs, or gallops Lungs: Clear to auscultation bilaterally, no wheezing, rhonchi or rales  Abd: Soft, nontender, no hepatomegaly  Ext: No edema, pulses 2+ Musculoskeletal: No deformities, BUE and BLE strength normal and equal Skin: Warm and dry, no rashes   Neuro: Alert and oriented to person, place, time, and situation, CNII-XII grossly intact, no focal deficits  Psych: Normal mood and affect   ASSESSMENT:   Samantha Perkins is a 71 y.o. female who presents for the following: 1. Coronary artery disease involving native coronary artery of native heart without angina pectoris   2. Mixed hyperlipidemia   3. Bilateral carotid artery stenosis   4. Obesity (BMI 30-39.9)    PLAN:   1. Coronary artery disease involving native coronary artery of native heart without angina pectoris 2. Mixed hyperlipidemia -Small inferior infarct in 2017 [place].  Treated medically.  Cardiac PET perfusion imaging in 2021 showed a fixed defect in the lateral wall consistent with prior infarct and normal EF.  No further symptoms of CAD.  Would recommend she continue aspirin 81 mg daily.  Ejection fraction was normal on PET scan.  On Lipitor 20 mg daily.  On Zetia 10 mg daily.  LDL last year well controlled.  She does report occasional cramping in her legs.  She is okay to continue Lipitor at current dose.  We may drop the dose if symptoms continue.  Overall doing quite well. -She does have PACs and palpitations.  Infrequent symptoms.  On metoprolol succinate for this.  3. Bilateral carotid artery stenosis -Concerns for 40-59% stenosis in the left carotid artery in [place].  Repeat studies here are negative.  Would recommend just continue aspirin  and statin.  4. Obesity (BMI 30-39.9) -Diet and exercise recommended.     Disposition: Return in about 1 year  (around 09/06/2022).  Medication Adjustments/Labs and Tests Ordered: Current medicines are reviewed at length with the patient today.  Concerns regarding medicines are outlined above.  No orders of the defined types were placed in this encounter.  No orders of the defined types were placed in this encounter.   Patient Instructions  Medication Instructions:  The current medical regimen is effective;  continue present plan and medications.  *If you need a refill on your cardiac medications before your next appointment, please call your pharmacy*   Lab Work: Have PCP fax over results [phone]  If you have labs (blood work) drawn today and your tests are completely normal, you will receive your results only by: [place] (if you have MyChart) OR A paper copy in the mail If you have any lab test that is abnormal or we need to change your treatment, we will call you to review the results.   Follow-Up: At [facility], you and your health needs are our priority.  As part of our continuing mission to provide you with exceptional heart care, we have created designated Provider Care Teams.  These Care Teams include your primary Cardiologist (physician) and Advanced Practice Providers (APPs -  Physician Assistants and Nurse Practitioners) who all work together to provide you with the care you need, when you need it.  We recommend signing up for the patient portal called "MyChart".  Sign up information is provided on this After Visit Summary.  MyChart is used to connect with patients for Virtual Visits (Telemedicine).  Patients are able to view lab/test results, encounter notes, upcoming appointments, etc.  Non-urgent messages can be sent to your provider as well.   To learn more about what you can do with MyChart, go to [email or web address].    Your next appointment:   12 month(s)  The format for your next appointment:   In Person  Provider:   Eleonore Chiquito, MD      Time  Spent with Patient: I have spent a total of 35 minutes with patient reviewing hospital notes, telemetry, EKGs, labs and examining the patient as well as establishing an assessment and plan that was discussed with the patient.  > 50% of time was spent in direct patient care.  Signed, Addison Naegeli. Audie Box, MD, [place]  [address], [place], [place] [address] [phone]  09/06/2021 10:09 AM

## 2021-09-06 ENCOUNTER — Other Ambulatory Visit: Payer: Self-pay

## 2021-09-06 ENCOUNTER — Encounter: Payer: Self-pay | Admitting: Cardiovascular Disease

## 2021-09-06 ENCOUNTER — Ambulatory Visit: Payer: Medicare HMO | Admitting: Cardiovascular Disease

## 2021-09-06 VITALS — BP 122/78 | HR 73 | Ht 63.0 in | Wt 222.8 lb

## 2021-09-06 DIAGNOSIS — I6523 Occlusion and stenosis of bilateral carotid arteries: Secondary | ICD-10-CM

## 2021-09-06 DIAGNOSIS — E669 Obesity, unspecified: Secondary | ICD-10-CM

## 2021-09-06 DIAGNOSIS — I2510 Atherosclerotic heart disease of native coronary artery without angina pectoris: Secondary | ICD-10-CM

## 2021-09-06 DIAGNOSIS — E782 Mixed hyperlipidemia: Secondary | ICD-10-CM | POA: Diagnosis not present

## 2021-09-06 NOTE — Patient Instructions (Signed)
Medication Instructions:  The current medical regimen is effective;  continue present plan and medications.  *If you need a refill on your cardiac medications before your next appointment, please call your pharmacy*   Lab Work: Have PCP fax over results [phone]  If you have labs (blood work) drawn today and your tests are completely normal, you will receive your results only by: [place] (if you have MyChart) OR A paper copy in the mail If you have any lab test that is abnormal or we need to change your treatment, we will call you to review the results.   Follow-Up: At [facility], you and your health needs are our priority.  As part of our continuing mission to provide you with exceptional heart care, we have created designated Provider Care Teams.  These Care Teams include your primary Cardiologist (physician) and Advanced Practice Providers (APPs -  Physician Assistants and Nurse Practitioners) who all work together to provide you with the care you need, when you need it.  We recommend signing up for the patient portal called "MyChart".  Sign up information is provided on this After Visit Summary.  MyChart is used to connect with patients for Virtual Visits (Telemedicine).  Patients are able to view lab/test results, encounter notes, upcoming appointments, etc.  Non-urgent messages can be sent to your provider as well.   To learn more about what you can do with MyChart, go to [email or web address].    Your next appointment:   12 month(s)  The format for your next appointment:   In Person  Provider:   Eleonore Chiquito, MD

## 2021-09-13 ENCOUNTER — Telehealth: Payer: Self-pay

## 2021-09-13 DIAGNOSIS — M8580 Other specified disorders of bone density and structure, unspecified site: Secondary | ICD-10-CM

## 2021-09-13 DIAGNOSIS — M25512 Pain in left shoulder: Secondary | ICD-10-CM | POA: Diagnosis not present

## 2021-09-13 NOTE — Telephone Encounter (Signed)
Benefits submitted-pending decision Next injection due 10/05/21 or after  Dr Einar Pheasant, per chart last Bone Density scan done on 02/11/2019-does patient need to have this scan done again before another Prolia injection?

## 2021-09-14 ENCOUNTER — Encounter: Payer: Self-pay | Admitting: Cardiovascular Disease

## 2021-09-14 ENCOUNTER — Other Ambulatory Visit: Payer: Self-pay | Admitting: Family Medicine

## 2021-09-14 ENCOUNTER — Telehealth: Payer: Self-pay | Admitting: Cardiovascular Disease

## 2021-09-14 DIAGNOSIS — Z1231 Encounter for screening mammogram for malignant neoplasm of breast: Secondary | ICD-10-CM

## 2021-09-14 NOTE — Telephone Encounter (Signed)
When did patient start Prolia treatment?  ? ?If more than 2 years would recommend getting Dexa. If less than 2 years, can plan for Dexa with next mammogram in August ?

## 2021-09-14 NOTE — Telephone Encounter (Signed)
Do not postpone injection.

## 2021-09-14 NOTE — Telephone Encounter (Signed)
Noted. Waiting on benefits  ?

## 2021-09-14 NOTE — Telephone Encounter (Signed)
Patient calling to verify was blood work she needs to get done by her pcp  ?

## 2021-09-14 NOTE — Telephone Encounter (Signed)
Called patient to verify the length of time, no specific record of date in our records, patient will call me back due to not having her notebook with that information with her right now ?

## 2021-09-14 NOTE — Telephone Encounter (Signed)
Spoke with patient, she started Prolia injections on 06/12/2017. Patient was advised to call breast center in [place] and get scheduled for Bone Density scan. This may take a while to get in, if so, can patient go ahead and get her Prolia done this month or post pone still? Just wanted to clarify, thank you ?

## 2021-09-14 NOTE — Telephone Encounter (Signed)
Patient had call addressed via mychart.  ?Thanks! ? ?

## 2021-09-15 ENCOUNTER — Other Ambulatory Visit: Payer: Self-pay

## 2021-09-15 ENCOUNTER — Ambulatory Visit: Payer: Medicare HMO | Admitting: Family Medicine

## 2021-09-15 ENCOUNTER — Ambulatory Visit (INDEPENDENT_AMBULATORY_CARE_PROVIDER_SITE_OTHER): Payer: Medicare HMO | Admitting: Family Medicine

## 2021-09-15 VITALS — BP 102/62 | HR 80 | Temp 98.0°F | Ht 63.0 in | Wt 220.6 lb

## 2021-09-15 DIAGNOSIS — I252 Old myocardial infarction: Secondary | ICD-10-CM | POA: Diagnosis not present

## 2021-09-15 DIAGNOSIS — M8580 Other specified disorders of bone density and structure, unspecified site: Secondary | ICD-10-CM | POA: Diagnosis not present

## 2021-09-15 DIAGNOSIS — E782 Mixed hyperlipidemia: Secondary | ICD-10-CM | POA: Diagnosis not present

## 2021-09-15 LAB — CBC
HCT: 40.2 % (ref 36.0–46.0)
Hemoglobin: 13 g/dL (ref 12.0–15.0)
MCHC: 32.4 g/dL (ref 30.0–36.0)
MCV: 81.2 fl (ref 78.0–100.0)
Platelets: 207 10*3/uL (ref 150.0–400.0)
RBC: 4.95 Mil/uL (ref 3.87–5.11)
RDW: 14.5 % (ref 11.5–15.5)
WBC: 5.6 10*3/uL (ref 4.0–10.5)

## 2021-09-15 LAB — LIPID PANEL
Cholesterol: 110 mg/dL (ref 0–200)
HDL: 63.3 mg/dL (ref >39.00)
LDL Cholesterol: 27 mg/dL (ref 0–99)
NonHDL: 46.87
Total CHOL/HDL Ratio: 2
Triglycerides: 99 mg/dL (ref 0.0–149.0)
VLDL: 19.8 mg/dL (ref 0.0–40.0)

## 2021-09-15 LAB — COMPREHENSIVE METABOLIC PANEL
ALT: 24 U/L (ref 0–35)
AST: 23 U/L (ref 0–37)
Albumin: 4.3 g/dL (ref 3.5–5.2)
Alkaline Phosphatase: 59 U/L (ref 39–117)
BUN: 13 mg/dL (ref 6–23)
CO2: 31 mEq/L (ref 19–32)
Calcium: 9.7 mg/dL (ref 8.4–10.5)
Chloride: 102 mEq/L (ref 96–112)
Creatinine, Ser: 0.84 mg/dL (ref 0.40–1.20)
GFR: 70.46 mL/min (ref >60.00)
Glucose, Bld: 110 mg/dL — ABNORMAL HIGH (ref 70–99)
Potassium: 4.9 mEq/L (ref 3.5–5.1)
Sodium: 138 mEq/L (ref 135–145)
Total Bilirubin: 0.5 mg/dL (ref 0.2–1.2)
Total Protein: 7.2 g/dL (ref 6.0–8.3)

## 2021-09-15 LAB — HEMOGLOBIN A1C: Hgb A1c MFr Bld: 6.4 % (ref 4.6–6.5)

## 2021-09-15 LAB — VITAMIN D 25 HYDROXY (VIT D DEFICIENCY, FRACTURES): VITD: 54.44 ng/mL (ref 30.00–100.00)

## 2021-09-15 MED ORDER — SEMAGLUTIDE-WEIGHT MANAGEMENT 0.25 MG/0.5ML ~~LOC~~ SOAJ: 0.2500 mg | SUBCUTANEOUS | 0 refills | Status: DC

## 2021-09-15 NOTE — Assessment & Plan Note (Signed)
Patient is interested in weight loss medication discussed Mancel Parsons might be the safest option for her.  We will send to pharmacy and see if it can be covered by insurance.  Discussed importance of exercise and healthy diet in addition to medication.  If not covered she will just work on lifestyle changes and reach out if she like to meet with a nutritionist.  Return in 3 months for weight check.  Discussed increasing dose of Wegovy monthly if this is covered and started. ?

## 2021-09-15 NOTE — Patient Instructions (Addendum)
If you start Wegovy ?- Week: 1-4: dose 0.25 mg ?- update via mychart if tolerating and wanting to try next dose up ?- Week 5-8: dose 0.5 mg ? ? ?Work on diet and exercise ?Let me know if you want to see a nutritionist ? ?Bathroom ?- try to stop drinking 2 hours before bed ?- [place] the bathroom right before bed ?

## 2021-09-15 NOTE — Assessment & Plan Note (Signed)
Follows with Dr. Davina Poke with cardiology.  Taking Zetia and atorvastatin 20 mg.  Check lipids today. ?

## 2021-09-15 NOTE — Progress Notes (Signed)
? ?Subjective:  ? ?  ?Samantha Perkins is a 70 y.o. female presenting for Obesity (Wants to discuss weight loss medication ), Labs Only (Needs lipid panel done. Pt is fasting. ), and Advice Only (Im requesting last colonoscopy./Pt has a mammogram scheduled for 03/01/22/Bone density scheduled for 02/13/22) ?  ? ? ?HPI ? ?#Obesity ?- has been in a funk ?- has not felt like exercising ?- shoulder and foot are better ?- cardiologist recommended wegovy -  ?- does feel fatigued ?- sleepy a lot ?- wakes up over night ?- does not snore ? ?Does not doze off  ? ? ? ? ?Review of Systems ? ? ?Social History  ? ?Tobacco Use  ?Smoking Status Never  ?Smokeless Tobacco Never  ? ? ? ?   ?Objective:  ?  ?BP Readings from Last 3 Encounters:  ?09/15/21 102/62  ?09/06/21 122/78  ?06/03/21 124/82  ? ?Wt Readings from Last 3 Encounters:  ?09/15/21 220 lb 9 oz (100 kg)  ?09/06/21 222 lb 12.8 oz (101.1 kg)  ?06/03/21 218 lb (98.9 kg)  ? ? ?BP 102/62   Pulse 80   Temp 98 ?F (36.7 ?C) (Oral)   Ht 5\' 3"  (1.6 m)   Wt 220 lb 9 oz (100 kg)   SpO2 97%   BMI 39.07 kg/m?  ? ? ?Physical Exam ?Constitutional:   ?   General: She is not in acute distress. ?   Appearance: She is well-developed. She is not diaphoretic.  ?HENT:  ?   Right Ear: External ear normal.  ?   Left Ear: External ear normal.  ?   Nose: Nose normal.  ?Eyes:  ?   Conjunctiva/sclera: Conjunctivae normal.  ?Cardiovascular:  ?   Rate and Rhythm: Normal rate and regular rhythm.  ?Pulmonary:  ?   Effort: Pulmonary effort is normal. No respiratory distress.  ?   Breath sounds: Normal breath sounds. No wheezing.  ?Musculoskeletal:  ?   Cervical back: Neck supple.  ?Skin: ?   General: Skin is warm and dry.  ?   Capillary Refill: Capillary refill takes less than 2 seconds.  ?Neurological:  ?   Mental Status: She is alert. Mental status is at baseline.  ?Psychiatric:     ?   Mood and Affect: Mood normal.     ?   Behavior: Behavior normal.  ? ? ? ? ? ?   ?Assessment & Plan:  ? ?Problem List  Items Addressed This Visit   ? ?  ? Musculoskeletal and Integument  ? Osteopenia - Primary  ?  Prolia was started in 2018, next dose already scheduled.  We will check vitamin D and calcium today.  She is planning to repeat DEXA in the fall with her mammogram. ?  ?  ? Relevant Orders  ? Vitamin D, 25-hydroxy  ?  ? Other  ? History of MI (myocardial infarction)  ? Relevant Orders  ? Comprehensive metabolic panel  ? Hyperlipidemia  ?  Follows with Dr. Davina Poke with cardiology.  Taking Zetia and atorvastatin 20 mg.  Check lipids today. ?  ?  ? Relevant Orders  ? Lipid panel  ? Morbid obesity ([place])  ?  Patient is interested in weight loss medication discussed Mancel Parsons might be the safest option for her.  We will send to pharmacy and see if it can be covered by insurance.  Discussed importance of exercise and healthy diet in addition to medication.  If not covered she will just work on  lifestyle changes and reach out if she like to meet with a nutritionist.  Return in 3 months for weight check.  Discussed increasing dose of Wegovy monthly if this is covered and started. ?  ?  ? Relevant Medications  ? Semaglutide-Weight Management 0.25 MG/0.5ML SOAJ  ? Other Relevant Orders  ? Hemoglobin A1c  ? CBC  ? ? ? ?Return in about 3 months (around 12/16/2021) for weightloss check in . ? ?Lesleigh Noe, MD ? ?This visit occurred during the SARS-CoV-2 public health emergency.  Safety protocols were in place, including screening questions prior to the visit, additional usage of staff PPE, and extensive cleaning of exam room while observing appropriate contact time as indicated for disinfecting solutions.  ? ?

## 2021-09-15 NOTE — Assessment & Plan Note (Signed)
Prolia was started in 2018, next dose already scheduled.  We will check vitamin D and calcium today.  She is planning to repeat DEXA in the fall with her mammogram. ?

## 2021-09-16 ENCOUNTER — Encounter: Payer: Self-pay | Admitting: Family Medicine

## 2021-09-27 MED ORDER — DENOSUMAB 60 MG/ML ~~LOC~~ SOSY: 60.0000 mg | PREFILLED_SYRINGE | SUBCUTANEOUS | 0 refills | Status: DC

## 2021-09-27 NOTE — Telephone Encounter (Addendum)
CrCl is  98.38 mL/min. Calcium was 9.7 as of labs on 09/15/21. ?RX sent to the [facility]. ?NV 10/05/21 ?

## 2021-09-27 NOTE — Addendum Note (Signed)
Addended by: Kris Mouton on: 09/27/2021 03:59 PM ? ? Modules accepted: Orders ? ?

## 2021-10-04 DIAGNOSIS — D3132 Benign neoplasm of left choroid: Secondary | ICD-10-CM | POA: Diagnosis not present

## 2021-10-04 DIAGNOSIS — H524 Presbyopia: Secondary | ICD-10-CM | POA: Diagnosis not present

## 2021-10-04 DIAGNOSIS — H35371 Puckering of macula, right eye: Secondary | ICD-10-CM | POA: Diagnosis not present

## 2021-10-04 DIAGNOSIS — H04123 Dry eye syndrome of bilateral lacrimal glands: Secondary | ICD-10-CM | POA: Diagnosis not present

## 2021-10-05 ENCOUNTER — Other Ambulatory Visit: Payer: Self-pay

## 2021-10-05 ENCOUNTER — Ambulatory Visit (INDEPENDENT_AMBULATORY_CARE_PROVIDER_SITE_OTHER): Payer: Medicare HMO

## 2021-10-05 DIAGNOSIS — M8580 Other specified disorders of bone density and structure, unspecified site: Secondary | ICD-10-CM

## 2021-10-05 MED ORDER — DENOSUMAB 60 MG/ML ~~LOC~~ SOSY
60.0000 mg | PREFILLED_SYRINGE | Freq: Once | SUBCUTANEOUS | Status: AC
  Administered 2021-10-05: 60 mg via SUBCUTANEOUS

## 2021-10-05 NOTE — Progress Notes (Signed)
Pt came in this afternoon to receive her Prolia injection pt received injection Subq in the Right arm Successfully w/o any concerns. Pt provided her medication.  ? ?Pt will schedule a 6 month nurse visit for Prolia injection , Pt will  provide her  medication for injection.  ?

## 2021-10-11 ENCOUNTER — Other Ambulatory Visit: Payer: Self-pay | Admitting: Cardiovascular Disease

## 2021-10-11 ENCOUNTER — Other Ambulatory Visit: Payer: Self-pay | Admitting: Primary Care

## 2021-10-11 DIAGNOSIS — Z5181 Encounter for therapeutic drug level monitoring: Secondary | ICD-10-CM

## 2021-10-28 ENCOUNTER — Telehealth: Payer: Self-pay | Admitting: Cardiovascular Disease

## 2021-10-28 MED ORDER — NITROGLYCERIN 0.4 MG SL SUBL: 0.4000 mg | SUBLINGUAL_TABLET | SUBLINGUAL | 1 refills | Status: DC | PRN

## 2021-10-28 NOTE — Telephone Encounter (Signed)
?*  STAT* If patient is at the pharmacy, call can be transferred to refill team. ? ? ?1. Which medications need to be refilled? (please list name of each medication and dose if known) nitroGLYCERIN (NITROSTAT) 0.4 MG SL tablet ? ?2. Which pharmacy/location (including street and city if local pharmacy) is medication to be sent to? [facility] [place], [place] ? ?3. Do they need a 30 day or 90 day supply? 90 ? ?

## 2021-11-20 ENCOUNTER — Other Ambulatory Visit: Payer: Self-pay | Admitting: Cardiovascular Disease

## 2021-11-24 ENCOUNTER — Ambulatory Visit (INDEPENDENT_AMBULATORY_CARE_PROVIDER_SITE_OTHER): Payer: Medicare HMO

## 2021-11-24 VITALS — Ht 63.0 in | Wt 220.0 lb

## 2021-11-24 DIAGNOSIS — Z0000 Encounter for general adult medical examination without abnormal findings: Secondary | ICD-10-CM | POA: Diagnosis not present

## 2021-11-24 NOTE — Patient Instructions (Signed)
Samantha Perkins , ?Thank you for taking time to come for your Medicare Wellness Visit. I appreciate your ongoing commitment to your health goals. Please review the following plan we discussed and let me know if I can assist you in the future.  ? ?Screening recommendations/referrals: ?Colonoscopy: due 2026 ?Mammogram: scheduled for 03/01/2022 ?Bone Density: scheduled for 03/01/2022 ?Recommended yearly ophthalmology/optometry visit for glaucoma screening and checkup ?Recommended yearly dental visit for hygiene and checkup ? ?Vaccinations: ?Influenza vaccine: due 02/14/2022 ?Pneumococcal vaccine: due ?Tdap vaccine: completed 02/28/2019, due 02/27/2029 ?Shingles vaccine: completed   ?Covid-19: 05/29/2021, 11/05/2020, 05/08/2020, 09/09/2019, 08/05/2019 ? ?Advanced directives: Advance directive discussed with you today.  ? ?Conditions/risks identified: none ? ?Next appointment: Follow up in one year for your annual wellness visit  ? ? ?Preventive Care 62 Years and Older, Female ?Preventive care refers to lifestyle choices and visits with your health care provider that can promote health and wellness. ?What does preventive care include? ?A yearly physical exam. This is also called an annual well check. ?Dental exams once or twice a year. ?Routine eye exams. Ask your health care provider how often you should have your eyes checked. ?Personal lifestyle choices, including: ?Daily care of your teeth and gums. ?Regular physical activity. ?Eating a healthy diet. ?Avoiding tobacco and drug use. ?Limiting alcohol use. ?Practicing safe sex. ?Taking low-dose aspirin every day. ?Taking vitamin and mineral supplements as recommended by your health care provider. ?What happens during an annual well check? ?The services and screenings done by your health care provider during your annual well check will depend on your age, overall health, lifestyle risk factors, and family history of disease. ?Counseling  ?Your health care provider may ask you  questions about your: ?Alcohol use. ?Tobacco use. ?Drug use. ?Emotional well-being. ?Home and relationship well-being. ?Sexual activity. ?Eating habits. ?History of falls. ?Memory and ability to understand (cognition). ?Work and work Statistician. ?Reproductive health. ?Screening  ?You may have the following tests or measurements: ?Height, weight, and BMI. ?Blood pressure. ?Lipid and cholesterol levels. These may be checked every 5 years, or more frequently if you are over 50 years old. ?Skin check. ?Lung cancer screening. You may have this screening every year starting at age 22 if you have a 30-pack-year history of smoking and currently smoke or have quit within the past 15 years. ?Fecal occult blood test (FOBT) of the stool. You may have this test every year starting at age 82. ?Flexible sigmoidoscopy or colonoscopy. You may have a sigmoidoscopy every 5 years or a colonoscopy every 10 years starting at age 30. ?Hepatitis C blood test. ?Hepatitis B blood test. ?Sexually transmitted disease (STD) testing. ?Diabetes screening. This is done by checking your blood sugar (glucose) after you have not eaten for a while (fasting). You may have this done every 1-3 years. ?Bone density scan. This is done to screen for osteoporosis. You may have this done starting at age 17. ?Mammogram. This may be done every 1-2 years. Talk to your health care provider about how often you should have regular mammograms. ?Talk with your health care provider about your test results, treatment options, and if necessary, the need for more tests. ?Vaccines  ?Your health care provider may recommend certain vaccines, such as: ?Influenza vaccine. This is recommended every year. ?Tetanus, diphtheria, and acellular pertussis (Tdap, Td) vaccine. You may need a Td booster every 10 years. ?Zoster vaccine. You may need this after age 53. ?Pneumococcal 13-valent conjugate (PCV13) vaccine. One dose is recommended after age 35. ?Pneumococcal polysaccharide  (  PPSV23) vaccine. One dose is recommended after age 88. ?Talk to your health care provider about which screenings and vaccines you need and how often you need them. ?This information is not intended to replace advice given to you by your health care provider. Make sure you discuss any questions you have with your health care provider. ?Document Released: 07/30/2015 Document Revised: 03/22/2016 Document Reviewed: 05/04/2015 ?Elsevier Interactive Patient Education ? 2017 [place]. ? ?Fall Prevention in the Home ?Falls can cause injuries. They can happen to people of all ages. There are many things you can do to make your home safe and to help prevent falls. ?What can I do on the outside of my home? ?Regularly fix the edges of walkways and driveways and fix any cracks. ?Remove anything that might make you trip as you walk through a door, such as a raised step or threshold. ?Trim any bushes or trees on the path to your home. ?Use bright outdoor lighting. ?Clear any walking paths of anything that might make someone trip, such as rocks or tools. ?Regularly check to see if handrails are loose or broken. Make sure that both sides of any steps have handrails. ?Any raised decks and porches should have guardrails on the edges. ?Have any leaves, snow, or ice cleared regularly. ?Use sand or salt on walking paths during winter. ?Clean up any spills in your garage right away. This includes oil or grease spills. ?What can I do in the bathroom? ?Use night lights. ?Install grab bars by the toilet and in the tub and shower. Do not use towel bars as grab bars. ?Use non-skid mats or decals in the tub or shower. ?If you need to sit down in the shower, use a plastic, non-slip stool. ?Keep the floor dry. Clean up any water that spills on the floor as soon as it happens. ?Remove soap buildup in the tub or shower regularly. ?Attach bath mats securely with double-sided non-slip rug tape. ?Do not have throw rugs and other things on the  floor that can make you trip. ?What can I do in the bedroom? ?Use night lights. ?Make sure that you have a light by your bed that is easy to reach. ?Do not use any sheets or blankets that are too big for your bed. They should not hang down onto the floor. ?Have a firm chair that has side arms. You can use this for support while you get dressed. ?Do not have throw rugs and other things on the floor that can make you trip. ?What can I do in the kitchen? ?Clean up any spills right away. ?Avoid walking on wet floors. ?Keep items that you use a lot in easy-to-reach places. ?If you need to reach something above you, use a strong step stool that has a grab bar. ?Keep electrical cords out of the way. ?Do not use floor polish or wax that makes floors slippery. If you must use wax, use non-skid floor wax. ?Do not have throw rugs and other things on the floor that can make you trip. ?What can I do with my stairs? ?Do not leave any items on the stairs. ?Make sure that there are handrails on both sides of the stairs and use them. Fix handrails that are broken or loose. Make sure that handrails are as long as the stairways. ?Check any carpeting to make sure that it is firmly attached to the stairs. Fix any carpet that is loose or worn. ?Avoid having throw rugs at the top or bottom  of the stairs. If you do have throw rugs, attach them to the floor with carpet tape. ?Make sure that you have a light switch at the top of the stairs and the bottom of the stairs. If you do not have them, ask someone to add them for you. ?What else can I do to help prevent falls? ?Wear shoes that: ?Do not have high heels. ?Have rubber bottoms. ?Are comfortable and fit you well. ?Are closed at the toe. Do not wear sandals. ?If you use a stepladder: ?Make sure that it is fully opened. Do not climb a closed stepladder. ?Make sure that both sides of the stepladder are locked into place. ?Ask someone to hold it for you, if possible. ?Clearly mark and make  sure that you can see: ?Any grab bars or handrails. ?First and last steps. ?Where the edge of each step is. ?Use tools that help you move around (mobility aids) if they are needed. These include: ?Canes. ?Walkers.

## 2021-11-24 NOTE — Progress Notes (Signed)
?I connected with Samantha Perkins today by telephone and verified that I am speaking with the correct person using two identifiers. ?Location patient: home ?Location provider: work ?Persons participating in the virtual visit: Samantha Perkins, Samantha Perkins. ?  ?I discussed the limitations, risks, security and privacy concerns of performing an evaluation and management service by telephone and the availability of in person appointments. I also discussed with the patient that there may be a patient responsible charge related to this service. The patient expressed understanding and verbally consented to this telephonic visit.  ?  ?Interactive audio and video telecommunications were attempted between this provider and patient, however failed, due to patient having technical difficulties OR patient did not have access to video capability.  We continued and completed visit with audio only. ? ?  ? ?Vital signs may be patient reported or missing. ? ?Subjective:  ? Samantha Perkins is a 71 y.o. female who presents for Medicare Annual (Subsequent) preventive examination. ? ?Review of Systems    ? ?Cardiac Risk Factors include: advanced age (>16mn, >>81women);dyslipidemia;obesity (BMI >30kg/m2) ? ?   ?Objective:  ?  ?Today's Vitals  ? 11/24/21 1442  ?Weight: 220 lb (99.8 kg)  ?Height: '5\' 3"'$  (1.6 m)  ?PainSc: 2   ? ?Body mass index is 38.97 kg/m?. ? ? ?  11/24/2021  ?  2:49 PM  ?Advanced Directives  ?Does Patient Have a Medical Advance Directive? No  ? ? ?Current Medications (verified) ?Outpatient Encounter Medications as of 11/24/2021  ?Medication Sig  ? aspirin EC 81 MG tablet Take 81 mg by mouth daily. Swallow whole.  ? atorvastatin (LIPITOR) 20 MG tablet Take 1 tablet by mouth once daily  ? azelastine (ASTELIN) 0.1 % nasal spray as needed.  ? cetirizine (ZYRTEC ALLERGY) 10 MG tablet Take 1 tablet (10 mg total) by mouth daily. (Patient taking differently: Take 10 mg by mouth as needed.)  ? clobetasol (TEMOVATE) 0.05 % GEL  Apply topically 2 (two) times daily.  ? denosumab (PROLIA) 60 MG/ML SOSY injection Inject 60 mg into the skin every 6 (six) months.  ? desonide (DESOWEN) 0.05 % cream Apply topically as needed.  ? diclofenac Sodium (VOLTAREN) 1 % GEL Voltaren Arthritis Pain 1 % topical gel  ? estradiol (ESTRACE) 0.5 MG tablet Take 1 tablet by mouth once daily  ? ezetimibe (ZETIA) 10 MG tablet Take 1 tablet by mouth once daily  ? L-Lysine 500 MG TABS L-Lysine 500 mg tablet  ? Lactobacillus-Inulin (CULTURELLE ADULT ULT BALANCE PO) Culturelle  ? levothyroxine (SYNTHROID) 100 MCG tablet Take 1 tablet (100 mcg total) by mouth daily.  ? metoprolol succinate (TOPROL-XL) 25 MG 24 hr tablet Take 1 tablet (25 mg total) by mouth daily.  ? Multiple Vitamins-Minerals (CULTURELLE PROBIOTICS + MULTIV) CHEW Chew by mouth.  ? Multiple Vitamins-Minerals (ONE-A-DAY WOMENS 50+ ADVANTAGE PO) Take by mouth daily.  ? nitroGLYCERIN (NITROSTAT) 0.4 MG SL tablet Place 1 tablet (0.4 mg total) under the tongue as needed.  ? nystatin cream (MYCOSTATIN) Apply 1 application topically 3 (three) times daily as needed.  ? Melatonin 5 MG CAPS Take 5 mg by mouth at bedtime. (Patient not taking: Reported on 11/24/2021)  ? Semaglutide-Weight Management 0.25 MG/0.5ML SOAJ Inject 0.25 mg into the skin once a week. (Patient not taking: Reported on 11/24/2021)  ? ?No facility-administered encounter medications on file as of 11/24/2021.  ? ? ?Allergies (verified) ?Cymbalta [duloxetine hcl], Duloxetine, Latex, and Wound dressing adhesive  ? ?History: ?Past Medical History:  ?Diagnosis  Date  ? Carotid artery occlusion   ? Coronary artery disease   ? Distal LCX MI 2007 in [place]. PCI unable to be performed.   ? 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 Thyroid disease   ? ?Past Surgical History:  ?Procedure Laterality Date  ? ABDOMINAL HYSTERECTOMY    ? APPENDECTOMY    ? BILATERAL OOPHORECTOMY    ? CARDIAC CATHETERIZATION    ? CATARACT EXTRACTION, BILATERAL    ? GUM SURGERY     ? KNEE ARTHROSCOPY    ? LASIK    ? OVARIAN CYST REMOVAL    ? ROTATOR CUFF REPAIR Right   ? ROTATOR CUFF REPAIR W/ DISTAL CLAVICLE EXCISION Left   ? THYROID SURGERY  12/12/2007  ? THYROIDECTOMY, PARTIAL    ? TUBAL LIGATION    ? ?Family History  ?Problem Relation Age of Onset  ? Heart failure Mother   ? Hypothyroidism Mother   ? Aneurysm Mother   ? Stroke Mother   ? Mitral valve prolapse Mother   ? Lung cancer Mother   ?     smoker  ? Arthritis Father   ? Heart disease Father   ? Heart murmur Father   ? Diabetes Father   ? Hypertension Father   ? COPD Father   ? Diabetes Sister   ? Bladder Cancer Sister   ? Cataracts Sister   ? Arthritis Sister   ? Osteopenia Sister   ? Irritable bowel syndrome Sister   ? Mitral valve prolapse Sister   ? Fibroids Sister   ? Lung cancer Sister   ?     smoker  ? Mitral valve prolapse Sister   ? Hypertension Sister   ? Bradycardia Sister   ?     w/ pacemaker  ? Crohn's disease Sister   ? Stroke Sister   ? Liver cancer Sister   ? Colon cancer Maternal Uncle   ?     3 uncles   ? Pancreatic cancer Maternal Uncle   ?     2 uncles with both colon and pancreatic  ? Diabetes Maternal Grandmother   ? Breast cancer Neg Hx   ? ?Social History  ? ?Socioeconomic History  ? Marital status: Married  ?  Spouse name: Samantha Perkins  ? Number of children: 0  ? Years of education: master's degree  ? Highest education level: Not on file  ?Occupational History  ? Occupation: retired Pharmacist, hospital  ?Tobacco Use  ? Smoking status: Never  ? Smokeless tobacco: Never  ?Vaping Use  ? Vaping Use: Never used  ?Substance and Sexual Activity  ? Alcohol use: Yes  ?  Comment: twice a week, 1 drink  ? Drug use: Never  ? Sexual activity: Not Currently  ?  Birth control/protection: Surgical  ?Other Topics Concern  ? Not on file  ?Social History Narrative  ? 07/26/20  ? From: [place], moved to be near children  ? Living: with husband, Samantha Perkins [phone])  ? Work: retired Manufacturing engineer  ?   ? Family: step  daughter - Samantha Perkins - 2 step granddaughters - in the area  ?   ? Enjoys: water aerobics instructing, explore new places, reading, volunteering   ?   ? Exercise: not currently, joining the [employer]  ? Diet: tries to eat healthy, not as good as she did before  ?   ? Safety  ? Seat belts: Yes   ? Guns: No  ?  Safe in relationships: Yes   ? ?Social Determinants of Health  ? ?Financial Resource Strain: Low Risk   ? Difficulty of Paying Living Expenses: Not hard at all  ?Food Insecurity: No Food Insecurity  ? Worried About Charity fundraiser in the Last Year: Never true  ? Ran Out of Food in the Last Year: Never true  ?Transportation Needs: No Transportation Needs  ? Lack of Transportation (Medical): No  ? Lack of Transportation (Non-Medical): No  ?Physical Activity: Inactive  ? Days of Exercise per Week: 0 days  ? Minutes of Exercise per Session: 0 min  ?Stress: No Stress Concern Present  ? Feeling of Stress : Only a little  ?Social Connections: Not on file  ? ? ?Tobacco Counseling ?Counseling given: Not Answered ? ? ?Clinical Intake: ? ?Pre-visit preparation completed: Yes ? ?Pain : 0-10 ?Pain Score: 2  ?Pain Type: Chronic pain ?Pain Location: Back ?Pain Orientation: Lower ?Pain Descriptors / Indicators: Aching ?Pain Onset: More than a month ago ?Pain Frequency: Constant ? ?  ? ?Nutritional Status: BMI > 30  Obese ?Nutritional Risks: None ?Diabetes: No ? ?How often do you need to have someone help you when you read instructions, pamphlets, or other written materials from your doctor or pharmacy?: 1 - Never ?What is the last grade level you completed in school?: masters degree ? ?Diabetic? no ? ?Interpreter Needed?: No ? ?Information entered by :: NAllen Perkins ? ? ?Activities of Daily Living ? ?  11/24/2021  ?  2:53 PM 11/23/2021  ?  3:44 PM  ?In your present state of health, do you have any difficulty performing the following activities:  ?Hearing? 0 0  ?Vision? 0 0  ?Difficulty concentrating or making decisions? 1 1   ?Walking or climbing stairs? 0 0  ?Dressing or bathing? 0 0  ?Doing errands, shopping? 0 0  ?Preparing Food and eating ? N N  ?Using the Toilet? N N  ?In the past six months, have you accidently leaked urine? Tempie Donning

## 2021-12-02 ENCOUNTER — Encounter: Payer: Self-pay | Admitting: Internal Medicine

## 2021-12-02 ENCOUNTER — Ambulatory Visit: Payer: Medicare HMO | Admitting: Internal Medicine

## 2021-12-02 VITALS — BP 110/68 | HR 65 | Ht 63.0 in | Wt 225.0 lb

## 2021-12-02 DIAGNOSIS — E063 Autoimmune thyroiditis: Secondary | ICD-10-CM

## 2021-12-02 DIAGNOSIS — R635 Abnormal weight gain: Secondary | ICD-10-CM

## 2021-12-02 DIAGNOSIS — E038 Other specified hypothyroidism: Secondary | ICD-10-CM

## 2021-12-02 DIAGNOSIS — Z9009 Acquired absence of other part of head and neck: Secondary | ICD-10-CM

## 2021-12-02 LAB — TSH: TSH: 1.26 u[IU]/mL (ref 0.35–5.50)

## 2021-12-02 LAB — T4, FREE: Free T4: 1.1 ng/dL (ref 0.60–1.60)

## 2021-12-02 MED ORDER — LEVOTHYROXINE SODIUM 100 MCG PO TABS: 100.0000 ug | ORAL_TABLET | Freq: Every day | ORAL | 3 refills | Status: DC

## 2021-12-02 NOTE — Progress Notes (Signed)
Name: Samantha Perkins  MRN/ DOB: [id number], Aug 23, 1950    Age/ Sex: 71 y.o., female     PCP: Lesleigh Noe, MD   Reason for Endocrinology Evaluation: Hashimoto's Thyroiditis and MNG     Initial Endocrinology Clinic Visit: 09/17/2020    PATIENT IDENTIFIER: Samantha Perkins is a 71 y.o., female with a past medical history of Hashimoto's thyroiditis, Osteopenia, Dyslipidemia and CAD . She has followed with [facility] since 09/17/2020 for consultative assistance with management of her Hashimoto's Thyroiditis and MNG  HISTORICAL SUMMARY:  Moved from [place]   She was diagnosed with hypothyroidism in 2008 S/P left Lobectomy in 2009 for benign thyroid  Nodule nodule , was started on LT-4 replacement in 2009 A few years later Liothyronine was added due to sleep issues     Last thyroid ultrasound 03/2020, no new nodules per pt .   Has hx of several stress fractures with osteopenia for years. She has been on Actonel and Boniva , as of recent has been on Prolia ~ 3 yrs now She is on calcium 600 mg and Vitamin D through [facility]   Of note the pt had  MI in 2007, cardiac cath was unsucceful - follows with Dr. Marisue Ivan    We stopped Liothyronine 01/2021 per pt request    SUBJECTIVE:     Today (12/02/2021):  Samantha Perkins is here for a follow up on postoperative hypothyroidism    Weight continues to increase  Denies constipation or diarrhea  Denies palpitations  Denies local neck symptoms  She tears up more easily, she has been reducing estrogen  She has been exercising , cardiology suggested [facility]  She has done weight watchers years ago  She continues to have a left foot chip , no sx recommended   Levothyroxine 100 mcg ,daily    HISTORY:  Past Medical History:  Past Medical History:  Diagnosis Date   Carotid artery occlusion    Coronary artery disease    Distal LCX MI 2007 in [place]. PCI unable to be performed.    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 She has never used smokeless tobacco. She reports current alcohol use. She reports that she does not use drugs. Family History:  Family History  Problem Relation Age of Onset   Heart failure Mother    Hypothyroidism Mother    Aneurysm Mother    Stroke Mother    Mitral valve prolapse Mother    Lung cancer Mother        smoker   Arthritis Father    Heart disease Father    Heart murmur Father    Diabetes Father    Hypertension Father    COPD Father    Diabetes Sister    Bladder Cancer Sister  Cataracts Sister    Arthritis Sister    Osteopenia Sister    Irritable bowel syndrome Sister    Mitral valve prolapse Sister    Fibroids Sister    Lung cancer Sister        smoker   Mitral valve prolapse Sister    Hypertension Sister    Bradycardia Sister        w/ pacemaker   Crohn's disease Sister    Stroke Sister    Liver cancer Sister    Colon cancer Maternal Uncle        3 uncles    Pancreatic cancer Maternal Uncle        2 uncles with both colon and pancreatic   Diabetes Maternal Grandmother    Breast cancer Neg Hx      HOME MEDICATIONS: Allergies as of 12/02/2021       Reactions   Cymbalta [duloxetine Hcl] Rash   Duloxetine    Latex    Other reaction(s): Blisters   Wound Dressing Adhesive Other (See Comments)        Medication List        Accurate as of Dec 02, 2021 12:22 PM. If you have any questions, ask your nurse or doctor.           STOP taking these medications    Melatonin 5 MG Caps Stopped by: Dorita Sciara, MD       TAKE these medications    aspirin EC 81 MG tablet Take 81 mg by mouth daily. Swallow whole.   atorvastatin 20 MG tablet Commonly known as: LIPITOR Take 1 tablet by mouth once daily   azelastine 0.1 % nasal spray Commonly known as: ASTELIN as needed.   cetirizine 10 MG tablet Commonly known as: ZyrTEC Allergy Take 1 tablet (10 mg total) by mouth daily. What changed:  when to take this reasons to take this   clobetasol 0.05 % Gel Commonly known as: TEMOVATE Apply topically 2 (two) times daily.   CULTURELLE ADULT ULT BALANCE PO Culturelle   denosumab 60 MG/ML [place]os[place] injection Commonly known as: PROLIA Inject 60 mg into the skin every 6 (six) months.   desonide 0.05 % cream Commonly known as: DESOWEN Apply topically as needed.   diclofenac Sodium 1 % Gel Commonly known as: VOLTAREN Voltaren Arthritis Pain 1 % topical gel   estradiol 0.5 MG tablet Commonly known as: ESTRACE Take 1 tablet by mouth once daily   ezetimibe 10 MG tablet Commonly known as: ZETIA Take 1 tablet by mouth once daily   L-Lysine 500 MG Tabs L-Lysine 500 mg tablet   levothyroxine 100 MCG tablet Commonly known as: SYNTHROID Take 1 tablet (100 mcg total) by mouth daily.   metoprolol succinate 25 MG 24 hr tablet Commonly known as: TOPROL-XL Take 1 tablet (25 mg total) by mouth daily.   nitroGLYCERIN 0.4 MG SL tablet Commonly known as: NITROSTAT Place 1 tablet (0.4 mg total) under the tongue as needed.   nystatin cream Commonly known as: MYCOSTATIN Apply 1 application topically 3 (three) times daily as needed.   ONE-A-DAY WOMENS 50+ ADVANTAGE PO Take by mouth daily.   Culturelle Probiotics + Multiv Chew Chew by mouth.   Semaglutide-Weight Management 0.25 MG/0.5ML Soaj Inject 0.25 mg into the skin once a week.          OBJECTIVE:   PHYSICAL EXAM: VS: BP 110/68 (BP  Location: Right Arm, Patient Position: Sitting, Cuff Size: Large)   Pulse 65  Ht '5\' 3"'$  (1.6 m)   Wt 225 lb (102.1 kg)   SpO2 99%   BMI 39.86 kg/m    EXAM: General: Pt appears well and is in NAD  Neck: General: Supple without adenopathy. Thyroid: Thyroid size normal.  No goiter or nodules appreciated. N  Lungs: Clear with good BS bilat with no rales, rhonchi, or wheezes  Heart: Auscultation: RRR.  Abdomen: Normoactive bowel sounds, soft, nontender, without masses or organomegaly palpable  Extremities:  BL LE: No pretibial edema normal ROM and strength.  Mental Status:  Mood and affect: No depression, anxiety, or agitation     DATA REVIEWED:  Latest Reference Range & Units 12/02/21 13:36  TSH 0.35 - 5.50 uIU/mL 1.26  T4,Free(Direct) 0.60 - 1.60 ng/dL 1.10    ASSESSMENT / PLAN / RECOMMENDATIONS:   Hashimoto's Thyroiditis:   -Patient has been noted with weight gain -TSH is normal -No changes   Medications :  Continue levothyroxine 100 MCG daily   2. S/P left Lobectomy :   - S/P left lobectomy in 2009 secondary to Benign reasons - Per pt she has been getting annual ultrasounds, these records not available. - Repeat Ultrasound showed heterogenous gland without cysts or nodules  -Reassurance provided at this point -We will continue to monitor clinically and have low threshold for repeat ultrasound    3. Weight gain :  - We discussed weight watchers vs noom - brisk walking 175 minutes a week vs aerobic exercise due to foot issue      F/U in 6 months    Signed electronically by: Mack Guise, MD  [facility] End[facility]  [address] [place], [place] [address] Phone: [phone] FAX: [phone]      CC: Lesleigh Noe, [place] [address] Phone: [phone]  Fax: [phone]   Return to Endocrinology clinic as below: Future Appointments  Date Time Provider [place]  12/02/2021 12:30 PM Zackerie Sara, Melanie Crazier, MD [facility] None  12/21/2021  8:40 AM Lesleigh Noe, MD [facility] PEC  03/01/2022  2:00 PM [facility] MM 2 [facility]BCG[facility] CE  03/01/2022  2:30 PM [facility] DX DEXA 1 GI-BCGDG GI-BRE[facility] CE  04/12/2022  9:00 AM [facility] NURSE [facility] PEC

## 2021-12-02 NOTE — Patient Instructions (Signed)

## 2021-12-07 ENCOUNTER — Ambulatory Visit: Payer: Medicare HMO | Admitting: Internal Medicine

## 2021-12-21 ENCOUNTER — Ambulatory Visit (INDEPENDENT_AMBULATORY_CARE_PROVIDER_SITE_OTHER): Payer: Medicare HMO | Admitting: Family Medicine

## 2021-12-21 VITALS — BP 110/68 | HR 78 | Temp 97.6°F | Ht 63.0 in | Wt 226.0 lb

## 2021-12-21 DIAGNOSIS — Z0000 Encounter for general adult medical examination without abnormal findings: Secondary | ICD-10-CM | POA: Diagnosis not present

## 2021-12-21 DIAGNOSIS — B070 Plantar wart: Secondary | ICD-10-CM | POA: Diagnosis not present

## 2021-12-21 NOTE — Patient Instructions (Addendum)
Weight watchers is great!  Work on exercise  Reach out if wanting nutrition referral or if wanting to start ozempic

## 2021-12-21 NOTE — Progress Notes (Signed)
Subjective:     Samantha Perkins is a 71 y.o. female presenting for Obesity, Foot Problem (R foot ), and Annual Exam     HPI  #obesity - has looked into different diets - trying to take charge - has been doing a lot of stress eating - exercise - [employer] - recumbent bike and pool - husband with parkinson's  - feels ready to do it  #Foot pain - right side - has a bone chip on the left with pain - last 2 weeks - lesion on the bottom on the foot - painful to walk - feels like steping on a rock - using a bunion pad on this - with help  Review of Systems  09/15/2021: Clinic - obesity - start ozempic  Social History   Tobacco Use  Smoking Status Never  Smokeless Tobacco Never        Objective:    BP Readings from Last 3 Encounters:  12/21/21 110/68  12/02/21 110/68  09/15/21 102/62   Wt Readings from Last 3 Encounters:  12/21/21 226 lb (102.5 kg)  12/02/21 225 lb (102.1 kg)  11/24/21 220 lb (99.8 kg)    BP 110/68   Pulse 78   Temp 97.6 F (36.4 C) (Temporal)   Ht '5\' 3"'$  (1.6 m)   Wt 226 lb (102.5 kg)   SpO2 98%   BMI 40.03 kg/m    Physical Exam Constitutional:      General: She is not in acute distress.    Appearance: She is well-developed. She is not diaphoretic.  HENT:     Head: Normocephalic and atraumatic.     Right Ear: External ear normal.     Left Ear: External ear normal.     Nose: Nose normal.  Eyes:     General: No scleral icterus.    Extraocular Movements: Extraocular movements intact.     Conjunctiva/sclera: Conjunctivae normal.  Cardiovascular:     Rate and Rhythm: Normal rate and regular rhythm.     Heart sounds: No murmur heard. Pulmonary:     Effort: Pulmonary effort is normal. No respiratory distress.     Breath sounds: Normal breath sounds. No wheezing.  Abdominal:     General: Bowel sounds are normal. There is no distension.     Palpations: Abdomen is soft. There is no mass.     Tenderness: There is no abdominal tenderness.  There is no guarding or rebound.  Musculoskeletal:        General: Normal range of motion.     Cervical back: Neck supple.  Lymphadenopathy:     Cervical: No cervical adenopathy.  Skin:    General: Skin is warm and dry.     Capillary Refill: Capillary refill takes less than 2 seconds.     Comments: Right plantar foot below the great toe small firm verrucous lesion  Neurological:     Mental Status: She is alert and oriented to person, place, and time.     Deep Tendon Reflexes: Reflexes normal.  Psychiatric:        Mood and Affect: Mood normal.        Behavior: Behavior normal.          Assessment & Plan:   Problem List Items Addressed This Visit       Musculoskeletal and Integument   Plantar wart    Advised OTC topical salicylic acid treatment. If no improvement return here or see her podiatrist  Other   Morbid obesity ([place])    Start weight watchers. Work on regular exercise       Other Visit Diagnoses     Annual physical exam    -  Primary      Mammo and bone density in August  Colonoscopy due in April 2026  Immunization History  Administered Date(s) Administered   Fluad Quad(high Dose 65+) 05/26/2020, 05/14/2021   Moderna Covid-19 Vaccine Bivalent Booster 11yr & up 05/29/2021   Moderna Sars-Covid-2 Vaccination 08/05/2019, 09/09/2019, 05/08/2020, 11/05/2020   Pneumococcal Conjugate-13 05/01/2017   Pneumococcal Polysaccharide-23 07/13/2018   Tdap 02/28/2019   Zoster Recombinat (Shingrix) 05/18/2018, 01/01/2019   Up to date on immunizations  Encouraged healthy diet and exercise  Labs reviewed and up to date. No medication changes  Return in about 3 months (around 03/23/2022) for weight check-in.  JLesleigh Noe MD

## 2021-12-21 NOTE — Assessment & Plan Note (Signed)
Advised OTC topical salicylic acid treatment. If no improvement return here or see her podiatrist

## 2021-12-21 NOTE — Assessment & Plan Note (Signed)
Start weight watchers. Work on regular exercise

## 2022-01-05 ENCOUNTER — Other Ambulatory Visit: Payer: Self-pay | Admitting: Primary Care

## 2022-01-05 DIAGNOSIS — Z5181 Encounter for therapeutic drug level monitoring: Secondary | ICD-10-CM

## 2022-02-13 ENCOUNTER — Other Ambulatory Visit: Payer: Medicare HMO

## 2022-02-17 DIAGNOSIS — M25512 Pain in left shoulder: Secondary | ICD-10-CM | POA: Diagnosis not present

## 2022-02-17 DIAGNOSIS — M25511 Pain in right shoulder: Secondary | ICD-10-CM | POA: Diagnosis not present

## 2022-03-01 ENCOUNTER — Ambulatory Visit
Admission: RE | Admit: 2022-03-01 | Discharge: 2022-03-01 | Disposition: A | Discharge status: Home / Self Care | Payer: Medicare HMO | Source: Ambulatory Visit | Attending: Family Medicine | Admitting: Family Medicine

## 2022-03-01 DIAGNOSIS — Z1231 Encounter for screening mammogram for malignant neoplasm of breast: Secondary | ICD-10-CM | POA: Diagnosis not present

## 2022-03-01 DIAGNOSIS — M8580 Other specified disorders of bone density and structure, unspecified site: Secondary | ICD-10-CM

## 2022-03-01 DIAGNOSIS — Z780 Asymptomatic menopausal state: Secondary | ICD-10-CM | POA: Diagnosis not present

## 2022-03-01 DIAGNOSIS — M85851 Other specified disorders of bone density and structure, right thigh: Secondary | ICD-10-CM | POA: Diagnosis not present

## 2022-03-12 ENCOUNTER — Encounter (HOSPITAL_COMMUNITY): Payer: Self-pay | Admitting: Emergency Medicine

## 2022-03-12 ENCOUNTER — Other Ambulatory Visit: Payer: Self-pay

## 2022-03-12 ENCOUNTER — Ambulatory Visit (HOSPITAL_COMMUNITY)
Admission: EM | Admit: 2022-03-12 | Discharge: 2022-03-12 | Disposition: A | Discharge status: Home / Self Care | Payer: Medicare HMO | Attending: Emergency Medicine | Admitting: Emergency Medicine

## 2022-03-12 DIAGNOSIS — U071 COVID-19: Secondary | ICD-10-CM

## 2022-03-12 DIAGNOSIS — R051 Acute cough: Secondary | ICD-10-CM

## 2022-03-12 DIAGNOSIS — R52 Pain, unspecified: Secondary | ICD-10-CM

## 2022-03-12 MED ORDER — NIRMATRELVIR/RITONAVIR (PAXLOVID)TABLET: 3.0000 | ORAL_TABLET | Freq: Two times a day (BID) | ORAL | 0 refills | Status: AC

## 2022-03-12 MED ORDER — GUAIFENESIN ER 600 MG PO TB12: 600.0000 mg | ORAL_TABLET | Freq: Two times a day (BID) | ORAL | 0 refills | Status: AC | PRN

## 2022-03-12 NOTE — ED Provider Notes (Signed)
[facility]    CSN: [id number] Arrival date & time: 03/12/22  1019     History   Chief Complaint Chief Complaint  Patient presents with   covid positive    HPI BRAZIL VOYTKO is a 71 y.o. female.  Presents with positive home COVID test. Her husband tested positive on Friday. Her symptoms began Friday.  Nasal congestion and drainage, body aches, headache, fatigue, dry cough.  Denies fever. Patient woke up this morning feeling worse and took another test which was positive. Decreased appetite but drinking fluids Tried tylenol.  Had COVID last year, took paxlovid that greatly improved her symptoms  Past Medical History:  Diagnosis Date   Carotid artery occlusion    Coronary artery disease    Distal LCX MI 2007 in [place]. PCI unable to be performed.    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    Thyroid disease     Patient Active Problem List   Diagnosis Date Noted   Plantar wart 12/21/2021   Morbid obesity ([place]) 09/15/2021   Weight gain 06/03/2021   Brain fog 03/29/2021   Post covid-19 condition, unspecified 03/29/2021   Encounter for monitoring postmenopausal estrogen replacement therapy 01/19/2021   Allergic contact dermatitis due to plants, except food 01/19/2021   History of lobectomy of thyroid 09/17/2020   Age-related osteoporosis with current pathological fracture 09/17/2020   Osteopenia 07/26/2020   History of MI (myocardial infarction) 07/26/2020   Thyroid nodule 07/26/2020   Hypothyroidism due to Hashimoto's thyroiditis 07/26/2020   Hyperlipidemia 07/26/2020   Left foot pain 07/26/2020    Past Surgical History:  Procedure Laterality Date   ABDOMINAL HYSTERECTOMY     APPENDECTOMY     BILATERAL OOPHORECTOMY     CARDIAC CATHETERIZATION     CATARACT EXTRACTION, BILATERAL     GUM SURGERY     KNEE ARTHROSCOPY     LASIK     OVARIAN CYST REMOVAL     ROTATOR CUFF REPAIR Right    ROTATOR CUFF REPAIR W/ DISTAL CLAVICLE EXCISION Left     THYROID SURGERY  12/12/2007   THYROIDECTOMY, PARTIAL     TUBAL LIGATION      OB History   No obstetric history on file.      Home Medications    Prior to Admission medications   Medication Sig Start Date End Date Taking? Authorizing Provider  guaiFENesin (MUCINEX) 600 MG 12 hr tablet Take 1 tablet (600 mg total) by mouth 2 (two) times daily as needed for up to 3 days for cough or to loosen phlegm. 03/12/22 03/15/22 Yes Ravneet Spilker, Wells Guiles, PA-C  nirmatrelvir/ritonavir EUA (PAXLOVID) 20 x 150 MG & 10 x '100MG'$  TABS Take 3 tablets by mouth 2 (two) times daily for 5 days. Take nirmatrelvir (150 mg) two tablets twice daily for 5 days and ritonavir (100 mg) one tablet twice daily for 5 days. 03/12/22 03/17/22 Yes Yosiel Thieme, Vernice Jefferson  aspirin EC 81 MG tablet Take 81 mg by mouth daily. Swallow whole.    [provider]  atorvastatin (LIPITOR) 20 MG tablet Take 1 tablet by mouth once daily 11/21/21   O'Neal, Cassie Freer, MD  azelastine (ASTELIN) 0.1 % nasal spray as needed. 09/09/19   [provider]  cetirizine (ZYRTEC ALLERGY) 10 MG tablet Take 1 tablet (10 mg total) by mouth daily. Patient taking differently: Take 10 mg by mouth as needed. 06/19/20   Jaynee Eagles, PA-C  clobetasol (TEMOVATE) 0.05 % GEL Apply topically 2 (two) times daily.  12/01/20   [provider]  denosumab (PROLIA) 60 MG/ML SOSY injection Inject 60 mg into the skin every 6 (six) months. 09/27/21   Lesleigh Noe, MD  desonide (DESOWEN) 0.05 % cream Apply topically as needed. 02/25/20   [provider]  diclofenac Sodium (VOLTAREN) 1 % GEL Voltaren Arthritis Pain 1 % topical gel    [provider]  estradiol (ESTRACE) 0.5 MG tablet Take 1 tablet by mouth once daily 01/06/22   Lesleigh Noe, MD  ezetimibe (ZETIA) 10 MG tablet Take 1 tablet by mouth once daily 11/21/21   O'Neal, Cassie Freer, MD  L-Lysine 500 MG TABS L-Lysine 500 mg tablet    [provider]  Lactobacillus-Inulin  (CULTURELLE ADULT ULT BALANCE PO) Culturelle    [provider]  levothyroxine (SYNTHROID) 100 MCG tablet Take 1 tablet (100 mcg total) by mouth daily. 12/02/21   Shamleffer, Melanie Crazier, MD  metoprolol succinate (TOPROL-XL) 25 MG 24 hr tablet Take 1 tablet (25 mg total) by mouth daily. 07/19/21   O'Neal, Cassie Freer, MD  Multiple Vitamins-Minerals (CULTURELLE PROBIOTICS + MULTIV) CHEW Chew by mouth.    [provider]  Multiple Vitamins-Minerals (ONE-A-DAY WOMENS 50+ ADVANTAGE PO) Take by mouth daily.    [provider]  nitroGLYCERIN (NITROSTAT) 0.4 MG SL tablet Place 1 tablet (0.4 mg total) under the tongue as needed. 10/28/21   O'Neal, Cassie Freer, MD  nystatin cream (MYCOSTATIN) Apply 1 application topically 3 (three) times daily as needed. 02/24/20   [provider]    Family History Family History  Problem Relation Age of Onset   Heart failure Mother    Hypothyroidism Mother    Aneurysm Mother    Stroke Mother    Mitral valve prolapse Mother    Lung cancer Mother        smoker   Arthritis Father    Heart disease Father    Heart murmur Father    Diabetes Father    Hypertension Father    COPD Father    Diabetes Sister    Bladder Cancer Sister    Cataracts Sister    Arthritis Sister    Osteopenia Sister    Irritable bowel syndrome Sister    Mitral valve prolapse Sister    Fibroids Sister    Lung cancer Sister        smoker   Mitral valve prolapse Sister    Hypertension Sister    Bradycardia Sister        w/ pacemaker   Crohn's disease Sister    Stroke Sister    Liver cancer Sister    Colon cancer Maternal Uncle        3 uncles    Pancreatic cancer Maternal Uncle        2 uncles with both colon and pancreatic   Diabetes Maternal Grandmother    Breast cancer Neg Hx     Social History Social History   Tobacco Use   Smoking status: Never   Smokeless tobacco: Never  Vaping Use   Vaping Use: Never used  Substance Use  Topics   Alcohol use: Yes    Comment: twice a week, 1 drink   Drug use: Never     Allergies   Cymbalta [duloxetine hcl], Duloxetine, Latex, and Wound dressing adhesive   Review of Systems Review of Systems  Per HPI  Physical Exam Triage Vital Signs ED Triage Vitals  Enc Vitals Group     BP 03/12/22 1203 131/76  Pulse Rate 03/12/22 1203 94     Resp 03/12/22 1203 20     Temp 03/12/22 1203 97.9 F (36.6 C)     Temp Source 03/12/22 1203 Oral     SpO2 03/12/22 1203 98 %     Weight --      Height --      Head Circumference --      Peak Flow --      Pain Score 03/12/22 1200 8     Pain Loc --      Pain Edu? --      Excl. in [place]? --    No data found.  Updated Vital Signs BP 131/76 (BP Location: Right Arm)   Pulse 94   Temp 97.9 F (36.6 C) (Oral)   Resp 20   SpO2 98%     Physical Exam Vitals and nursing note reviewed.  Constitutional:      General: She is not in acute distress.    Appearance: Normal appearance.  HENT:     Mouth/Throat:     Mouth: Mucous membranes are moist.     Pharynx: Oropharynx is clear. Posterior oropharyngeal erythema present. No oropharyngeal exudate.     Comments: Post nasal drip Eyes:     Conjunctiva/sclera: Conjunctivae normal.  Cardiovascular:     Rate and Rhythm: Normal rate and regular rhythm.     Pulses: Normal pulses.     Heart sounds: Normal heart sounds.  Pulmonary:     Effort: Pulmonary effort is normal. No respiratory distress.     Breath sounds: Normal breath sounds. No wheezing, rhonchi or rales.     Comments: Dry cough Lymphadenopathy:     Cervical: No cervical adenopathy.  Skin:    General: Skin is warm and dry.  Neurological:     Mental Status: She is alert and oriented to person, place, and time.     UC Treatments / Results  Labs (all labs ordered are listed, but only abnormal results are displayed) Labs Reviewed - No data to display  EKG   Radiology No results found.  Procedures Procedures  (including critical care time)  Medications Ordered in UC Medications - No data to display  Initial Impression / Assessment and Plan / UC Course  I have reviewed the triage vital signs and the nursing notes.  Pertinent labs & imaging results that were available during my care of the patient were reviewed by me and considered in my medical decision making (see chart for details).  Well appearing, vitals stable. Positive home test this morning, offered to confirm in clinic but patient declines. Reasonable given +home test and close contact+. Paxlovid sent to pharmacy. Recommend mucinex for congestion/cough in the meantime. Increase fluids. Return precautions discussed. Patient agrees to plan  Final Clinical Impressions(s) / UC Diagnoses   Final diagnoses:  Positive self-administered antigen test for COVID-19  Acute cough  Body aches     Discharge Instructions      Please take medication as prescribed. Please stop your atorvastatin while taking this medication, and for 5 days after finishing.   Follow up with your primary care provider as needed.  Please go to the emergency department if symptoms worsen.      ED Prescriptions     Medication Sig Dispense Auth. Provider   guaiFENesin (MUCINEX) 600 MG 12 hr tablet Take 1 tablet (600 mg total) by mouth 2 (two) times daily as needed for up to 3 days for cough or to loosen phlegm. 6  tablet Chipper Koudelka, Wells Guiles, PA-C   nirmatrelvir/ritonavir EUA (PAXLOVID) 20 x 150 MG & 10 x '100MG'$  TABS Take 3 tablets by mouth 2 (two) times daily for 5 days. Take nirmatrelvir (150 mg) two tablets twice daily for 5 days and ritonavir (100 mg) one tablet twice daily for 5 days. 30 tablet Brownie Gockel, Wells Guiles, PA-C      PDMP not reviewed this encounter.   Kyra Leyland 03/12/22 1242

## 2022-03-12 NOTE — Discharge Instructions (Addendum)
Please take medication as prescribed. Please stop your atorvastatin while taking this medication, and for 5 days after finishing.   Follow up with your primary care provider as needed.  Please go to the emergency department if symptoms worsen.

## 2022-03-12 NOTE — ED Triage Notes (Signed)
03/10/2022 is when symptoms started.  Husband tested positive for covid on Friday, patient tested negative on Friday.  Today felt significantly worse and tested for covid again, this time positive.    Cough, general aches muscles and joints, headache, and fatigue

## 2022-03-13 ENCOUNTER — Telehealth: Payer: Self-pay

## 2022-03-13 DIAGNOSIS — U071 COVID-19: Secondary | ICD-10-CM

## 2022-03-13 NOTE — Telephone Encounter (Signed)
Per chart review tab pt was seen at [facility] on 03/12/22. Pt was given Paxlovid per note. Sending note to Dr Einar Pheasant.

## 2022-03-13 NOTE — Telephone Encounter (Signed)
Noted  

## 2022-03-13 NOTE — Telephone Encounter (Signed)
Samantha Perkins - Client TELEPHONE ADVICE RECORD [facility] Patient Name: Samantha Perkins Gender: Female DOB: 08/02/1950 Age: 71 Y 9 M 17 D Return Phone Number: [phone] (Primary) Address: City/ State/ Zip: [place] [address] Client [facility] - Client Client Site [place] Provider Waunita Schooner- MD Contact Type Call Who Is Calling Patient / Member / Family / Caregiver Call Type Triage / Clinical Relationship To Patient Self Return Phone Number [phone] (Primary) Chief Complaint Headache Reason for Call Symptomatic / Request for Samantha Perkins states, tested positive for COVID this morning. Has a headache, drainage, coughing, muscle aches. Achy joints, very fatigue. [place] Not Listed [facility]- [address] in [place] Translation No Nurse Assessment Nurse: Cristino Martes, RN, Joelene Millin Date/Time (Eastern Time): 03/12/2022 9:10:42 AM Confirm and document reason for call. If symptomatic, describe symptoms. ---Caller states tested positive for COVID this morning (home test). SX: headache, drainage, coughing, muscle ache, achy joints,and very fatigue. Denies fever. Drinking well and urinating. Does the patient have any new or worsening symptoms? ---Yes Will a triage be completed? ---Yes Related visit to physician within the last 2 weeks? ---No Does the PT have any chronic conditions? (i.e. diabetes, asthma, this includes High risk factors for pregnancy, etc.) ---Yes List chronic conditions. ---Heart attack 2007 Is this a behavioral health or substance abuse call? ---No Guidelines Guideline Title Affirmed Question Affirmed Notes Nurse Date/Time ([place] Time) COVID-19 - Diagnosed or Suspected [1] HIGH RISK patient (e.g., weak immune system, age > 53 years, obesity with BMI 30 or higher, pregnant, chronic lung disease or other  chronic [place], RN, Joelene Millin 03/12/2022 9:13:17 AM PLEASE NOTE: All timestamps contained within this report are represented as [place] Standard Time. CONFIDENTIALTY NOTICE: This fax transmission is intended only for the addressee. It contains information that is legally privileged, confidential or otherwise protected from use or disclosure. If you are not the intended recipient, you are strictly prohibited from reviewing, disclosing, copying using or disseminating any of this information or taking any action in reliance on or regarding this information. If you have received this fax in error, please notify us immediately by telephone so that we can arrange for its return to [place]. Phone: [phone], Toll-Free: [phone], Fax: [phone] Page: 2 of 3 Call Id: [id number] Guidelines Guideline Title Affirmed Question Affirmed Notes Nurse Date/Time Eilene Ghazi Time) medical condition) AND [2] COVID symptoms (e.g., cough, fever) (Exceptions: Already seen by PCP and no new or worsening symptoms.) Disp. Time Eilene Ghazi Time) Disposition Final User 03/12/2022 9:17:37 AM Call PCP within 24 Hours Yes Cristino Martes, RN, Joelene Millin Final Disposition 03/12/2022 9:17:37 AM Call PCP within 24 Hours Yes [place], RN, Renea Ee Disagree/Comply Comply Caller Understands Yes PreDisposition Call Doctor Care Advice Given Per Guideline CALL PCP WITHIN 24 HOURS: * IF OFFICE WILL BE OPEN: Call the office when it opens tomorrow morning. GENERAL CARE ADVICE FOR COVID-19 SYMPTOMS: * The symptoms are generally treated the same whether you have COVID-19, influenza or some other respiratory virus. * Cough: Use cough drops. * Feeling dehydrated: Drink extra liquids. If the air in your home is dry, use a humidifier. * Fever, Chills, and Sweats: For fever over 101 F (38.3 C), take acetaminophen every 4 to 6 hours (Adults 650 mg) OR ibuprofen every 6 to 8 hours (Adults 400 mg). Before taking any medicine, read all  the instructions on the package. Do not take aspirin unless your doctor has prescribed it for you.  Chills can sometimes come before a fever. You may feel cold in your hands and feet. You may have shivering. You may also feel sweaty as your body temperature goes down. * Muscle aches, headache, and other pains: Often this comes and goes with the fever. Take acetaminophen every 4 to 6 hours (Adults 650 mg) OR ibuprofen every 6 to 8 hours (Adults 400 mg). Before taking any medicine, read all the instructions on the package. COVID-19 - HOW TO PROTECT OTHERS - WHEN YOU ARE SICK WITH COVID-19: * STAY HOME A MINIMUM OF 5 DAYS: People with MILD COVID-19 can STOP HOME ISOLATION AFTER 5 DAYS if (1) fever has been gone for 24 hours (without using fever medicine) AND (2) symptoms are better. Continue to wear a well-fitted mask for a full 10 days when around others. * WEAR A MASK FOR 10 DAYS: Wear a well-fitted mask for 10 full days any time you are around others inside your home or in public. Do not go to places where you are unable to wear a mask. * AVOID TRAVEL: Avoid travel for 10 days after you tested positive for COVID-19. * [place] HANDS OFTEN: Wash hands often with soap and water. After coughing or sneezing are important times. If soap and water are not available, use an alcohol-based hand sanitizer with at least 60% alcohol, covering all surfaces of your hands and rubbing them together until they feel dry. Avoid touching your eyes, nose, and mouth with unwashed hands. CALL BACK IF: * You become worse CARE ADVICE given per COVID-19 - DIAGNOSED OR SUSPECTED (Adult) guideline. Comments User: Jim Desanctis, RN Date/Time Eilene Ghazi Time): 03/12/2022 9:13:15 AM PLEASE NOTE: All timestamps contained within this report are represented as [place] Standard Time. CONFIDENTIALTY NOTICE: This fax transmission is intended only for the addressee. It contains information that is legally privileged, confidential  or otherwise protected from use or disclosure. If you are not the intended recipient, you are strictly prohibited from reviewing, disclosing, copying using or disseminating any of this information or taking any action in reliance on or regarding this information. If you have received this fax in error, please notify us immediately by telephone so that we can arrange for its return to [place]. Phone: [phone], Toll-Free: [phone], Fax: [phone] Page: 3 of 3 Call Id: [id number] Comments dry cough, some productive (clear) HA (7/10) Tylenol for HA Referrals GO TO [facility]

## 2022-03-15 ENCOUNTER — Telehealth: Payer: Self-pay | Admitting: Family Medicine

## 2022-03-15 ENCOUNTER — Telehealth: Payer: Self-pay

## 2022-03-15 DIAGNOSIS — M8000XS Age-related osteoporosis with current pathological fracture, unspecified site, sequela: Secondary | ICD-10-CM

## 2022-03-15 MED ORDER — PROMETHAZINE-DM 6.25-15 MG/5ML PO SYRP: 2.5000 mL | ORAL_SOLUTION | Freq: Four times a day (QID) | ORAL | 0 refills | Status: DC | PRN

## 2022-03-15 NOTE — Telephone Encounter (Signed)
Cough syrup sent to pharmacy  May make her sleepy

## 2022-03-15 NOTE — Telephone Encounter (Signed)
Pt notified and pt said [facility] [place] so she wouldn't have to drive so far. [facility] [place]. Rx at [facility] cancelled.

## 2022-03-15 NOTE — Telephone Encounter (Signed)
error 

## 2022-03-15 NOTE — Telephone Encounter (Signed)
Benefits Submitted, pending. RX goes to [facility] usually. Next injection due 04/08/22

## 2022-03-15 NOTE — Addendum Note (Signed)
Addended by: Loreen Freud on: 03/15/2022 12:08 PM   Modules accepted: Orders

## 2022-03-15 NOTE — Addendum Note (Signed)
Addended by: Waunita Schooner R on: 03/15/2022 11:54 AM   Modules accepted: Orders

## 2022-03-15 NOTE — Telephone Encounter (Signed)
Pt called asking if something could be prescribed for cough at night. Pt was prescribed paxlovid already, wanted to see if there's any cough meds that wouldn't hender with that medicine. Call back # is [phone]

## 2022-03-15 NOTE — Telephone Encounter (Signed)
Patient called in about rxpromethazine-dextromethorphan (PROMETHAZINE-DM) 6.25-15 MG/5ML syrup ,she stated that she thinks that shes not suppose to take it because shes a heart patient. She would like to know if its okay to take?

## 2022-03-16 NOTE — Telephone Encounter (Signed)
Spoke to pt and she states that her Cardiologist has always told her not to take any DM rx's, so pt has not opened the bottle yet. Pt did call the pharmacist and pharmacist told her that she should be ok if only takes a couple doses.  Pt states that she is feeling better today and will only take if absolutely necessary.

## 2022-03-17 NOTE — Telephone Encounter (Signed)
Left message to call back  

## 2022-03-17 NOTE — Telephone Encounter (Signed)
Noted  

## 2022-03-21 ENCOUNTER — Other Ambulatory Visit: Payer: Self-pay | Admitting: Family Medicine

## 2022-03-22 ENCOUNTER — Ambulatory Visit (INDEPENDENT_AMBULATORY_CARE_PROVIDER_SITE_OTHER): Payer: Medicare HMO | Admitting: Family Medicine

## 2022-03-22 ENCOUNTER — Encounter: Payer: Self-pay | Admitting: Family Medicine

## 2022-03-22 VITALS — BP 126/78 | HR 81 | Temp 98.6°F | Ht 63.0 in | Wt 203.0 lb

## 2022-03-22 DIAGNOSIS — U099 Post covid-19 condition, unspecified: Secondary | ICD-10-CM | POA: Diagnosis not present

## 2022-03-22 DIAGNOSIS — M8000XA Age-related osteoporosis with current pathological fracture, unspecified site, initial encounter for fracture: Secondary | ICD-10-CM | POA: Diagnosis not present

## 2022-03-22 DIAGNOSIS — Z79890 Hormone replacement therapy (postmenopausal): Secondary | ICD-10-CM | POA: Diagnosis not present

## 2022-03-22 DIAGNOSIS — M8000XS Age-related osteoporosis with current pathological fracture, unspecified site, sequela: Secondary | ICD-10-CM

## 2022-03-22 MED ORDER — DENOSUMAB 60 MG/ML ~~LOC~~ SOSY: 60.0000 mg | PREFILLED_SYRINGE | SUBCUTANEOUS | 0 refills | Status: DC

## 2022-03-22 NOTE — Assessment & Plan Note (Signed)
She has lost 20 pounds with weight watchers.  Continue healthy diet and exercise follow-up if difficulty losing weight.

## 2022-03-22 NOTE — Assessment & Plan Note (Signed)
Recovering well, discussed timing of COVID booster as well as flu shot.

## 2022-03-22 NOTE — Patient Instructions (Addendum)
Post nasal drip - Take zyrtec - store brand fine - daily - can try allergy nasal spray for 2 weeks  If no improvement - switch to flonase for 2 weeks  Update if no improvement with this treatment   Estradiol - slowly try to decrease dose - can do 6 pills per week and slowly taper - take 1/2 pill every other day or as often as able to reduce

## 2022-03-22 NOTE — Assessment & Plan Note (Signed)
She has successfully reduced her dose to 0.5 mg.  Some occasional hot flashes and some irritability.  She is motivated to come off of this completely, discussed option of reducing to 6 pills/week or cutting pills in half depending on how she responds.  She will slowly taper her dose and follow-up in approximately 3 months.  Gust if still dealing with irritability and hot flashes could consider safer alternative of an antidepressant medication.

## 2022-03-22 NOTE — Progress Notes (Signed)
   Subjective:     Samantha Perkins is a 71 y.o. female presenting for Weight Loss     HPI  Weight gain - had lost 20 lbs  - doing weight watchers - has not been exercising  Recently had covid Still has some throat drainage Not coughing a lot   Taking zyrtec occasionally  azelastine   Was exercising some  Hormone replacement - has lowered estradiol to 0.5 mg  - was severe for a while - still having some hot episodes    Review of Systems   Social History   Tobacco Use  Smoking Status Never  Smokeless Tobacco Never        Objective:    BP Readings from Last 3 Encounters:  03/22/22 126/78  03/12/22 131/76  12/21/21 110/68   Wt Readings from Last 3 Encounters:  03/22/22 203 lb (92.1 kg)  12/21/21 226 lb (102.5 kg)  12/02/21 225 lb (102.1 kg)    BP 126/78   Pulse 81   Temp 98.6 F (37 C) (Oral)   Ht '5\' 3"'$  (1.6 m)   Wt 203 lb (92.1 kg)   SpO2 97%   BMI 35.96 kg/m    Physical Exam Constitutional:      General: She is not in acute distress.    Appearance: She is well-developed. She is not diaphoretic.  HENT:     Right Ear: External ear normal.     Left Ear: External ear normal.     Nose: Nose normal.  Eyes:     Conjunctiva/sclera: Conjunctivae normal.  Cardiovascular:     Rate and Rhythm: Normal rate.  Pulmonary:     Effort: Pulmonary effort is normal.  Musculoskeletal:     Cervical back: Neck supple.  Skin:    General: Skin is warm and dry.     Capillary Refill: Capillary refill takes less than 2 seconds.  Neurological:     Mental Status: She is alert. Mental status is at baseline.  Psychiatric:        Mood and Affect: Mood normal.        Behavior: Behavior normal.           Assessment & Plan:   Problem List Items Addressed This Visit       Musculoskeletal and Integument   Age-related osteoporosis with current pathological fracture - Primary    Continue Prolia, due for 49-monthinjection.      Relevant Medications    denosumab (PROLIA) 60 MG/ML SOSY injection     Other   Post covid-19 condition, unspecified    Recovering well, discussed timing of COVID booster as well as flu shot.      Morbid obesity ([place]    She has lost 20 pounds with weight watchers.  Continue healthy diet and exercise follow-up if difficulty losing weight.      Hormone replacement therapy    She has successfully reduced her dose to 0.5 mg.  Some occasional hot flashes and some irritability.  She is motivated to come off of this completely, discussed option of reducing to 6 pills/week or cutting pills in half depending on how she responds.  She will slowly taper her dose and follow-up in approximately 3 months.  Gust if still dealing with irritability and hot flashes could consider safer alternative of an antidepressant medication.        Return in about 3 months (around 06/21/2022) for with Dr. lSilvio Pateor BDiona Browner  JLesleigh Noe MD

## 2022-03-22 NOTE — Assessment & Plan Note (Signed)
Continue Prolia, due for 65-monthinjection.

## 2022-04-05 NOTE — Telephone Encounter (Signed)
Patient advised. Lab on 04/06/22 and NV 04/12/22 Prolia was sent to [facility] pharmacy and patient picked it up already.

## 2022-04-05 NOTE — Addendum Note (Signed)
Addended by: Kris Mouton on: 04/05/2022 04:25 PM   Modules accepted: Orders

## 2022-04-06 ENCOUNTER — Other Ambulatory Visit (INDEPENDENT_AMBULATORY_CARE_PROVIDER_SITE_OTHER): Payer: Medicare HMO

## 2022-04-06 DIAGNOSIS — M8000XA Age-related osteoporosis with current pathological fracture, unspecified site, initial encounter for fracture: Secondary | ICD-10-CM | POA: Diagnosis not present

## 2022-04-06 DIAGNOSIS — M8000XS Age-related osteoporosis with current pathological fracture, unspecified site, sequela: Secondary | ICD-10-CM

## 2022-04-06 LAB — BASIC METABOLIC PANEL
BUN: 12 mg/dL (ref 6–23)
CO2: 30 mEq/L (ref 19–32)
Calcium: 9.4 mg/dL (ref 8.4–10.5)
Chloride: 103 mEq/L (ref 96–112)
Creatinine, Ser: 0.84 mg/dL (ref 0.40–1.20)
GFR: 70.19 mL/min (ref >60.00)
Glucose, Bld: 85 mg/dL (ref 70–99)
Potassium: 4.4 mEq/L (ref 3.5–5.1)
Sodium: 139 mEq/L (ref 135–145)

## 2022-04-10 ENCOUNTER — Other Ambulatory Visit: Payer: Self-pay | Admitting: Family Medicine

## 2022-04-10 DIAGNOSIS — D2371 Other benign neoplasm of skin of right lower limb, including hip: Secondary | ICD-10-CM | POA: Diagnosis not present

## 2022-04-10 DIAGNOSIS — L03012 Cellulitis of left finger: Secondary | ICD-10-CM | POA: Diagnosis not present

## 2022-04-10 DIAGNOSIS — L03011 Cellulitis of right finger: Secondary | ICD-10-CM | POA: Diagnosis not present

## 2022-04-10 DIAGNOSIS — D2272 Melanocytic nevi of left lower limb, including hip: Secondary | ICD-10-CM | POA: Diagnosis not present

## 2022-04-10 DIAGNOSIS — L821 Other seborrheic keratosis: Secondary | ICD-10-CM | POA: Diagnosis not present

## 2022-04-10 DIAGNOSIS — D485 Neoplasm of uncertain behavior of skin: Secondary | ICD-10-CM | POA: Diagnosis not present

## 2022-04-10 DIAGNOSIS — Z5181 Encounter for therapeutic drug level monitoring: Secondary | ICD-10-CM

## 2022-04-11 NOTE — Telephone Encounter (Signed)
CrCl is  90.61 mL/min Calcium normal at 9.4

## 2022-04-12 ENCOUNTER — Ambulatory Visit (INDEPENDENT_AMBULATORY_CARE_PROVIDER_SITE_OTHER): Payer: Medicare HMO | Admitting: *Deleted

## 2022-04-12 DIAGNOSIS — M8000XA Age-related osteoporosis with current pathological fracture, unspecified site, initial encounter for fracture: Secondary | ICD-10-CM | POA: Diagnosis not present

## 2022-04-12 DIAGNOSIS — M8000XS Age-related osteoporosis with current pathological fracture, unspecified site, sequela: Secondary | ICD-10-CM

## 2022-04-12 MED ORDER — DENOSUMAB 60 MG/ML ~~LOC~~ SOSY
60.0000 mg | PREFILLED_SYRINGE | Freq: Once | SUBCUTANEOUS | Status: AC
  Administered 2022-04-12: 60 mg via SUBCUTANEOUS

## 2022-04-12 NOTE — Progress Notes (Signed)
Per orders of Dr. Einar Pheasant, injection of Prolia given by Emelia Salisbury. Patient tolerated injection well.

## 2022-04-25 ENCOUNTER — Telehealth: Payer: Self-pay | Admitting: Family Medicine

## 2022-04-25 NOTE — Telephone Encounter (Signed)
Patient called in and was wanting to know if Zovirax or Acyclovir could be called in for her. She stated that she has tried OTC medication but nothing is helping. Thank you!

## 2022-04-26 NOTE — Telephone Encounter (Signed)
Patient has been scheduled

## 2022-04-27 ENCOUNTER — Ambulatory Visit (INDEPENDENT_AMBULATORY_CARE_PROVIDER_SITE_OTHER): Payer: Medicare HMO | Admitting: Family Medicine

## 2022-04-27 ENCOUNTER — Encounter: Payer: Self-pay | Admitting: Family Medicine

## 2022-04-27 VITALS — BP 120/78 | HR 71 | Temp 98.2°F | Ht 63.0 in | Wt 203.4 lb

## 2022-04-27 DIAGNOSIS — K5792 Diverticulitis of intestine, part unspecified, without perforation or abscess without bleeding: Secondary | ICD-10-CM | POA: Insufficient documentation

## 2022-04-27 DIAGNOSIS — B002 Herpesviral gingivostomatitis and pharyngotonsillitis: Secondary | ICD-10-CM | POA: Insufficient documentation

## 2022-04-27 DIAGNOSIS — E063 Autoimmune thyroiditis: Secondary | ICD-10-CM | POA: Insufficient documentation

## 2022-04-27 MED ORDER — VALACYCLOVIR HCL 1 G PO TABS: 2.0000 g | ORAL_TABLET | Freq: Two times a day (BID) | ORAL | 0 refills | Status: DC

## 2022-04-27 NOTE — Progress Notes (Signed)
Patient ID: Samantha Perkins, female    DOB: 1951-02-20, 71 y.o.   MRN: [id number]  This visit was conducted in person.  BP 120/78   Pulse 71   Temp 98.2 F (36.8 C) (Oral)   Ht '5\' 3"'$  (1.6 m)   Wt 203 lb 6 oz (92.3 kg)   SpO2 97%   BMI 36.03 kg/m    CC:  Chief Complaint  Patient presents with   Mouth Lesions    Fever Blisters    Subjective:   HPI: Samantha Perkins is a 71 y.o. female with history of Hashimoto's disease, hypothyroidism  and previous patient of Samantha Perkins presenting on 04/27/2022 for Mouth Lesions (Fever Blisters)   She has not yet set up primary care provider since Dr. Einar Pheasant left.   She started noting 2-3 weeks ago... red bump on right upper lip.  Progressed to a lesion on left lower lip... sensitive, blister with surrounding redness.   Has been treated with lysine and Abreva.  Triggers are sun, colds, dry lips.  She has had intermittent issue in past with fever blisters... 4-5 times a year.      Relevant past medical, surgical, family and social history reviewed and updated as indicated. Interim medical history since our last visit reviewed. Allergies and medications reviewed and updated. Outpatient Medications Prior to Visit  Medication Sig Dispense Refill   aspirin EC 81 MG tablet Take 81 mg by mouth daily. Swallow whole.     atorvastatin (LIPITOR) 20 MG tablet Take 1 tablet by mouth once daily 90 tablet 3   azelastine (ASTELIN) 0.1 % nasal spray as needed.     cetirizine (ZYRTEC) 10 MG tablet Take 10 mg by mouth daily as needed for allergies.     clobetasol (TEMOVATE) 0.05 % GEL Apply topically 2 (two) times daily.     denosumab (PROLIA) 60 MG/ML SOSY injection Inject 60 mg into the skin every 6 (six) months. 1 mL 0   desonide (DESOWEN) 0.05 % cream Apply topically as needed.     diclofenac Sodium (VOLTAREN) 1 % GEL Voltaren Arthritis Pain 1 % topical gel     Docosanol (ABREVA EX) Apply topically 3 (three) times daily as needed.     estradiol  (ESTRACE) 0.5 MG tablet Take 1 tablet by mouth once daily (Patient taking differently: 4 times a week (weaning off)) 90 tablet 0   ezetimibe (ZETIA) 10 MG tablet Take 1 tablet by mouth once daily 90 tablet 3   L-Lysine 500 MG TABS L-Lysine 500 mg tablet     Lactobacillus-Inulin (CULTURELLE ADULT ULT BALANCE PO) Culturelle     levothyroxine (SYNTHROID) 100 MCG tablet Take 1 tablet (100 mcg total) by mouth daily. 90 tablet 3   metoprolol succinate (TOPROL-XL) 25 MG 24 hr tablet Take 1 tablet (25 mg total) by mouth daily. 90 tablet 1   Multiple Vitamins-Minerals (CULTURELLE PROBIOTICS + MULTIV) CHEW Chew by mouth.     Multiple Vitamins-Minerals (ONE-A-DAY WOMENS 50+ ADVANTAGE PO) Take by mouth daily.     nitroGLYCERIN (NITROSTAT) 0.4 MG SL tablet Place 1 tablet (0.4 mg total) under the tongue as needed. 25 tablet 1   nystatin cream (MYCOSTATIN) Apply 1 application topically 3 (three) times daily as needed.     promethazine-dextromethorphan (PROMETHAZINE-DM) 6.25-15 MG/5ML syrup Take 2.5 mLs by mouth 4 (four) times daily as needed for cough. 118 mL 0   cetirizine (ZYRTEC ALLERGY) 10 MG tablet Take 1 tablet (10 mg total) by mouth daily. (  Patient taking differently: Take 10 mg by mouth as needed.) 90 tablet 0   No facility-administered medications prior to visit.     Per HPI unless specifically indicated in ROS section below Review of Systems  Constitutional:  Negative for fatigue and fever.  HENT:  Negative for congestion.   Eyes:  Negative for pain.  Respiratory:  Negative for cough and shortness of breath.   Cardiovascular:  Negative for chest pain, palpitations and leg swelling.  Gastrointestinal:  Negative for abdominal pain.  Genitourinary:  Negative for dysuria and vaginal bleeding.  Musculoskeletal:  Negative for back pain.  Neurological:  Negative for syncope, light-headedness and headaches.  Psychiatric/Behavioral:  Negative for dysphoric mood.    Objective:  BP 120/78   Pulse 71    Temp 98.2 F (36.8 C) (Oral)   Ht '5\' 3"'$  (1.6 m)   Wt 203 lb 6 oz (92.3 kg)   SpO2 97%   BMI 36.03 kg/m   Wt Readings from Last 3 Encounters:  04/27/22 203 lb 6 oz (92.3 kg)  03/22/22 203 lb (92.1 kg)  12/21/21 226 lb (102.5 kg)      Physical Exam Constitutional:      General: She is not in acute distress.    Appearance: Normal appearance. She is well-developed. She is not ill-appearing or toxic-appearing.  HENT:     Head: Normocephalic.     Right Ear: Hearing, tympanic membrane, ear canal and external ear normal. Tympanic membrane is not erythematous, retracted or bulging.     Left Ear: Hearing, tympanic membrane, ear canal and external ear normal. Tympanic membrane is not erythematous, retracted or bulging.     Nose: No mucosal edema or rhinorrhea.     Right Sinus: No maxillary sinus tenderness or frontal sinus tenderness.     Left Sinus: No maxillary sinus tenderness or frontal sinus tenderness.     Mouth/Throat:     Pharynx: Uvula midline.      Comments: Small patch of dry flaky skin at left lower lip margin Eyes:     General: Lids are normal. Lids are everted, no foreign bodies appreciated.     Conjunctiva/sclera: Conjunctivae normal.     Pupils: Pupils are equal, round, and reactive to light.  Neck:     Thyroid: No thyroid mass or thyromegaly.     Vascular: No carotid bruit.     Trachea: Trachea normal.  Cardiovascular:     Rate and Rhythm: Normal rate and regular rhythm.     Pulses: Normal pulses.     Heart sounds: Normal heart sounds, S1 normal and S2 normal. No murmur heard.    No friction rub. No gallop.  Pulmonary:     Effort: Pulmonary effort is normal. No tachypnea or respiratory distress.     Breath sounds: Normal breath sounds. No decreased breath sounds, wheezing, rhonchi or rales.  Abdominal:     General: Bowel sounds are normal.     Palpations: Abdomen is soft.     Tenderness: There is no abdominal tenderness.  Musculoskeletal:     Cervical back:  Normal range of motion and neck supple.  Skin:    General: Skin is warm and dry.     Findings: No rash.  Neurological:     Mental Status: She is alert.  Psychiatric:        Mood and Affect: Mood is not anxious or depressed.        Speech: Speech normal.        Behavior:  Behavior normal. Behavior is cooperative.        Thought Content: Thought content normal.        Judgment: Judgment normal.       Results for orders placed or performed in visit on 60/15/61  Basic Metabolic Panel  Result Value Ref Range   Sodium 139 135 - 145 mEq/L   Potassium 4.4 3.5 - 5.1 mEq/L   Chloride 103 96 - 112 mEq/L   CO2 30 19 - 32 mEq/L   Glucose, Bld 85 70 - 99 mg/dL   BUN 12 6 - 23 mg/dL   Creatinine, Ser 0.84 0.40 - 1.20 mg/dL   GFR 70.19 >60.00 mL/min   Calcium 9.4 8.4 - 10.5 mg/dL     COVID 19 screen:  No recent travel or known exposure to COVID19 The patient denies respiratory symptoms of COVID 19 at this time. The importance of social distancing was discussed today.   Assessment and Plan    Problem List Items Addressed This Visit     Oral herpes simplex infection - Primary    Acute flare of chronic issue Will provide valacyclovir 2 g twice daily x24 hours dose to treat future infections.  Current infection is resolved.  Additional preventative measures discussed in detail.      Relevant Medications   Docosanol (ABREVA EX)   valACYclovir (VALTREX) 1000 MG tablet   Meds ordered this encounter  Medications   valACYclovir (VALTREX) 1000 MG tablet    Sig: Take 2 tablets (2,000 mg total) by mouth 2 (two) times daily for 1 day.    Dispense:  20 tablet    Refill:  0     Eliezer Lofts, MD

## 2022-04-27 NOTE — Assessment & Plan Note (Signed)
Acute flare of chronic issue Will provide valacyclovir 2 g twice daily x24 hours dose to treat future infections.  Current infection is resolved.  Additional preventative measures discussed in detail.

## 2022-05-19 ENCOUNTER — Other Ambulatory Visit: Payer: Medicare HMO

## 2022-05-23 ENCOUNTER — Other Ambulatory Visit: Payer: Medicare HMO

## 2022-05-29 ENCOUNTER — Ambulatory Visit: Payer: Medicare HMO | Admitting: Internal Medicine

## 2022-06-15 ENCOUNTER — Encounter: Payer: Self-pay | Admitting: Internal Medicine

## 2022-06-15 ENCOUNTER — Ambulatory Visit (INDEPENDENT_AMBULATORY_CARE_PROVIDER_SITE_OTHER): Payer: Medicare HMO | Admitting: Internal Medicine

## 2022-06-15 VITALS — BP 100/70 | HR 76 | Temp 97.1°F | Ht 63.0 in | Wt 199.0 lb

## 2022-06-15 DIAGNOSIS — J0140 Acute pansinusitis, unspecified: Secondary | ICD-10-CM | POA: Diagnosis not present

## 2022-06-15 MED ORDER — BENZONATATE 200 MG PO CAPS: 200.0000 mg | ORAL_CAPSULE | Freq: Three times a day (TID) | ORAL | 0 refills | Status: DC | PRN

## 2022-06-15 MED ORDER — AMOXICILLIN 500 MG PO TABS: 1000.0000 mg | ORAL_TABLET | Freq: Two times a day (BID) | ORAL | 0 refills | Status: DC

## 2022-06-15 NOTE — Progress Notes (Signed)
Subjective:    Patient ID: Samantha Perkins, female    DOB: 1950/09/16, 71 y.o.   MRN: [id number]  HPI Here due to respiratory infection  Started with sore throat about 5 days ago Really felt bad 3 days ago--COVID negative Phenergan cough syrup--not much help Lots of drainage---and pain in left ear Some yellow phlegm No fever, chills or sweats Does have post nasal drip--almost constant Cough waking her No SOB General headache  Took tylenol and mucinex as well  Current Outpatient Medications on File Prior to Visit  Medication Sig Dispense Refill   aspirin EC 81 MG tablet Take 81 mg by mouth daily. Swallow whole.     atorvastatin (LIPITOR) 20 MG tablet Take 1 tablet by mouth once daily 90 tablet 3   azelastine (ASTELIN) 0.1 % nasal spray as needed.     cetirizine (ZYRTEC) 10 MG tablet Take 10 mg by mouth daily as needed for allergies.     clobetasol (TEMOVATE) 0.05 % GEL Apply topically 2 (two) times daily.     denosumab (PROLIA) 60 MG/ML SOSY injection Inject 60 mg into the skin every 6 (six) months. 1 mL 0   desonide (DESOWEN) 0.05 % cream Apply topically as needed.     diclofenac Sodium (VOLTAREN) 1 % GEL Voltaren Arthritis Pain 1 % topical gel     Docosanol (ABREVA EX) Apply topically 3 (three) times daily as needed.     estradiol (ESTRACE) 0.5 MG tablet Take 1 tablet by mouth once daily (Patient taking differently: 4 times a week (weaning off)) 90 tablet 0   ezetimibe (ZETIA) 10 MG tablet Take 1 tablet by mouth once daily 90 tablet 3   L-Lysine 500 MG TABS L-Lysine 500 mg tablet     Lactobacillus-Inulin (CULTURELLE ADULT ULT BALANCE PO) Culturelle     levothyroxine (SYNTHROID) 100 MCG tablet Take 1 tablet (100 mcg total) by mouth daily. 90 tablet 3   metoprolol succinate (TOPROL-XL) 25 MG 24 hr tablet Take 1 tablet (25 mg total) by mouth daily. 90 tablet 1   Multiple Vitamins-Minerals (CULTURELLE PROBIOTICS + MULTIV) CHEW Chew by mouth.     Multiple Vitamins-Minerals  (ONE-A-DAY WOMENS 50+ ADVANTAGE PO) Take by mouth daily.     nitroGLYCERIN (NITROSTAT) 0.4 MG SL tablet Place 1 tablet (0.4 mg total) under the tongue as needed. 25 tablet 1   nystatin cream (MYCOSTATIN) Apply 1 application topically 3 (three) times daily as needed.     promethazine-dextromethorphan (PROMETHAZINE-DM) 6.25-15 MG/5ML syrup Take 2.5 mLs by mouth 4 (four) times daily as needed for cough. 118 mL 0   No current facility-administered medications on file prior to visit.    Allergies  Allergen Reactions   Cymbalta [Duloxetine Hcl] Rash   Duloxetine    Latex     Other reaction(s): Blisters   Wound Dressing Adhesive Other (See Comments)    Past Medical History:  Diagnosis Date   Carotid artery occlusion    Coronary artery disease    Distal LCX MI 2007 in [place]. PCI unable to be performed.    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    Thyroid disease     Past Surgical History:  Procedure Laterality Date   ABDOMINAL HYSTERECTOMY     APPENDECTOMY     BILATERAL OOPHORECTOMY     CARDIAC CATHETERIZATION     CATARACT EXTRACTION, BILATERAL     GUM SURGERY     KNEE ARTHROSCOPY     LASIK     OVARIAN  CYST REMOVAL     ROTATOR CUFF REPAIR Right    ROTATOR CUFF REPAIR W/ DISTAL CLAVICLE EXCISION Left    THYROID SURGERY  12/12/2007   THYROIDECTOMY, PARTIAL     TUBAL LIGATION      Family History  Problem Relation Age of Onset   Heart failure Mother    Hypothyroidism Mother    Aneurysm Mother    Stroke Mother    Mitral valve prolapse Mother    Lung cancer Mother        smoker   Arthritis Father    Heart disease Father    Heart murmur Father    Diabetes Father    Hypertension Father    COPD Father    Diabetes Sister    Bladder Cancer Sister    Cataracts Sister    Arthritis Sister    Osteopenia Sister    Irritable bowel syndrome Sister    Mitral valve prolapse Sister    Fibroids Sister    Lung cancer Sister        smoker   Mitral valve prolapse Sister     Hypertension Sister    Bradycardia Sister        w/ pacemaker   Crohn's disease Sister    Stroke Sister    Liver cancer Sister    Colon cancer Maternal Uncle        3 uncles    Pancreatic cancer Maternal Uncle        2 uncles with both colon and pancreatic   Diabetes Maternal Grandmother    Breast cancer Neg Hx     Social History   Socioeconomic History   Marital status: Married    Spouse name: Hiram Comber   Number of children: 0   Years of education: master's degree   Highest education level: Not on file  Occupational History   Occupation: retired Pharmacist, hospital  Tobacco Use   Smoking status: Never   Smokeless tobacco: Never  Vaping Use   Vaping Use: Never used  Substance and Sexual Activity   Alcohol use: Yes    Comment: twice a week, 1 drink   Drug use: Never   Sexual activity: Not Currently    Birth control/protection: Surgical  Other Topics Concern   Not on file  Social History Narrative   07/26/20   From: [place], moved to be near children   Living: with husband, Hiram Comber (1980)   Work: retired - Lexicographer      Family: step daughter - Product manager - 2 step granddaughters - in the area      Enjoys: Environmental education officer, explore new places, reading, volunteering       Exercise: not currently, joining the [employer]   Diet: tries to eat healthy, not as good as she did before      Safety   Seat belts: Yes    Guns: No   Safe in relationships: Yes    Social Determinants of Health   Financial Resource Strain: Low Risk  (11/24/2021)   Overall Financial Resource Strain (CARDIA)    Difficulty of Paying Living Expenses: Not hard at all  Food Insecurity: No Food Insecurity (11/24/2021)   Hunger Vital Sign    Worried About Running Out of Food in the Last Year: Never true    [place] in the Last Year: Never true  Transportation Needs: No Transportation Needs (11/24/2021)   PRAPARE - Hydrologist (Medical): No  Lack of Transportation (Non-Medical): No  Physical Activity: Inactive (11/24/2021)   Exercise Vital Sign    Days of Exercise per Week: 0 days    Minutes of Exercise per Session: 0 min  Stress: No Stress Concern Present (11/24/2021)   [place]    Feeling of Stress : Only a little  Social Connections: Not on file  Intimate Partner Violence: Not on file   Review of Systems No N/V Able to eat--but mostly soup, etc Prone to sinus symptoms     Objective:   Physical Exam Constitutional:      Appearance: Normal appearance.  HENT:     Head:     Comments: No sinus tenderness    Right Ear: Tympanic membrane and ear canal normal.     Left Ear: Tympanic membrane and ear canal normal.     Nose: Congestion present.     Mouth/Throat:     Pharynx: No oropharyngeal exudate or posterior oropharyngeal erythema.  Pulmonary:     Effort: Pulmonary effort is normal.     Breath sounds: Normal breath sounds. No wheezing or rales.  Musculoskeletal:     Cervical back: Neck supple.  Lymphadenopathy:     Cervical: No cervical adenopathy.  Neurological:     Mental Status: She is alert.            Assessment & Plan:

## 2022-06-15 NOTE — Assessment & Plan Note (Signed)
Likely viral but clearly sinus based Discussed DM cough syrup and will prescribe benzonatate Analgesics--tylenol Rx for amoxil 1000 bid x 7 days if worsens

## 2022-06-19 ENCOUNTER — Ambulatory Visit
Admission: EM | Admit: 2022-06-19 | Discharge: 2022-06-19 | Disposition: A | Discharge status: Home / Self Care | Payer: Medicare HMO

## 2022-06-19 DIAGNOSIS — J0140 Acute pansinusitis, unspecified: Secondary | ICD-10-CM | POA: Diagnosis not present

## 2022-06-19 DIAGNOSIS — J209 Acute bronchitis, unspecified: Secondary | ICD-10-CM | POA: Diagnosis not present

## 2022-06-19 MED ORDER — METHYLPREDNISOLONE 4 MG PO TBPK: ORAL_TABLET | ORAL | 0 refills | Status: DC

## 2022-06-19 MED ORDER — AMOXICILLIN-POT CLAVULANATE 875-125 MG PO TABS: 1.0000 | ORAL_TABLET | Freq: Two times a day (BID) | ORAL | 0 refills | Status: DC

## 2022-06-19 NOTE — ED Triage Notes (Signed)
Pt. Presents to UC w/ c/o a  productive cough, nasal drainage, and a sore throat for the past week. Pt. States she was seen at her primary and treated for the same symptoms but they have not got better.

## 2022-06-19 NOTE — ED Provider Notes (Signed)
Samantha Perkins    CSN: [id number] Arrival date & time: 06/19/22  7341      History   Chief Complaint Chief Complaint  Patient presents with   Cough   Headache   Sore Throat    HPI Samantha Perkins is a 71 y.o. female.    Cough Associated symptoms: headaches   Headache Associated symptoms: cough   Sore Throat Associated symptoms include headaches.    Presents to urgent care with complaint of productive cough, nasal congestion, sore throat x 1 week (8 days).  Patient was seen at her PCP on 11/30 (4 days) and treated for likely viral sinusitis with benzonatate.  He provided a Rx for Amoxil 1000 mg twice daily x 7 days which she has been taking however she states her symptoms have worsened.  Cough is now severe and hacking.  Not sleeping very well at night.  Denies fever, chills, myalgias. Denies n/v/d.  She says benzonatate is providing some relief.  She states her insurance did not cover it but was happy to get the medication less expensively through [facility].  Past Medical History:  Diagnosis Date   Carotid artery occlusion    Coronary artery disease    Distal LCX MI 2007 in [place]. PCI unable to be performed.    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    Thyroid disease     Patient Active Problem List   Diagnosis Date Noted   Acute non-recurrent pansinusitis 06/15/2022   Hashimoto's disease 04/27/2022   Diverticulitis 04/27/2022   Oral herpes simplex infection 04/27/2022   Hormone replacement therapy 03/22/2022   Plantar wart 12/21/2021   Morbid obesity ([place]) 09/15/2021   Weight gain 06/03/2021   Brain fog 03/29/2021   Post covid-19 condition, unspecified 03/29/2021   Encounter for monitoring postmenopausal estrogen replacement therapy 01/19/2021   Allergic contact dermatitis due to plants, except food 01/19/2021   History of lobectomy of thyroid 09/17/2020   Age-related osteoporosis with current pathological fracture 09/17/2020   Osteopenia  07/26/2020   History of MI (myocardial infarction) 07/26/2020   Thyroid nodule 07/26/2020   Hypothyroidism due to Hashimoto's thyroiditis 07/26/2020   Hyperlipidemia 07/26/2020   Left foot pain 07/26/2020    Past Surgical History:  Procedure Laterality Date   ABDOMINAL HYSTERECTOMY     APPENDECTOMY     BILATERAL OOPHORECTOMY     CARDIAC CATHETERIZATION     CATARACT EXTRACTION, BILATERAL     GUM SURGERY     KNEE ARTHROSCOPY     LASIK     OVARIAN CYST REMOVAL     ROTATOR CUFF REPAIR Right    ROTATOR CUFF REPAIR W/ DISTAL CLAVICLE EXCISION Left    THYROID SURGERY  12/12/2007   THYROIDECTOMY, PARTIAL     TUBAL LIGATION      OB History   No obstetric history on file.      Home Medications    Prior to Admission medications   Medication Sig Start Date End Date Taking? Authorizing Provider  clotrimazole-betamethasone (LOTRISONE) cream Apply topically 2 (two) times daily. 04/10/22  Yes [provider]  [place] injection  04/28/22  Yes [provider]  amoxicillin (AMOXIL) 500 MG tablet Take 2 tablets (1,000 mg total) by mouth 2 (two) times daily for 7 days. 06/15/22 06/22/22  Viviana Simpler I, MD  aspirin EC 81 MG tablet Take 81 mg by mouth daily. Swallow whole.    [provider]  atorvastatin (LIPITOR) 20 MG tablet Take 1 tablet  by mouth once daily 11/21/21   O'Neal, Cassie Freer, MD  azelastine (ASTELIN) 0.1 % nasal spray as needed. 09/09/19   [provider]  benzonatate (TESSALON) 200 MG capsule Take 1 capsule (200 mg total) by mouth 3 (three) times daily as needed for cough. 06/15/22   Venia Carbon, MD  cetirizine (ZYRTEC) 10 MG tablet Take 10 mg by mouth daily as needed for allergies.    [provider]  clobetasol (TEMOVATE) 0.05 % GEL Apply topically 2 (two) times daily. 12/01/20   [provider]  denosumab (PROLIA) 60 MG/ML SOSY injection Inject 60 mg into the skin every 6 (six) months. 03/22/22   Waunita Schooner, MD   desonide (DESOWEN) 0.05 % cream Apply topically as needed. 02/25/20   [provider]  diclofenac Sodium (VOLTAREN) 1 % GEL Voltaren Arthritis Pain 1 % topical gel    [provider]  Docosanol (ABREVA EX) Apply topically 3 (three) times daily as needed.    [provider]  estradiol (ESTRACE) 0.5 MG tablet Take 1 tablet by mouth once daily Patient taking differently: 4 times a week (weaning off) 04/11/22   Waunita Schooner, MD  ezetimibe (ZETIA) 10 MG tablet Take 1 tablet by mouth once daily 11/21/21   O'Neal, Cassie Freer, MD  L-Lysine 500 MG TABS L-Lysine 500 mg tablet    [provider]  Lactobacillus-Inulin (CULTURELLE ADULT ULT BALANCE PO) Culturelle    [provider]  levothyroxine (SYNTHROID) 100 MCG tablet Take 1 tablet (100 mcg total) by mouth daily. 12/02/21   Shamleffer, Melanie Crazier, MD  metoprolol succinate (TOPROL-XL) 25 MG 24 hr tablet Take 1 tablet (25 mg total) by mouth daily. 07/19/21   O'Neal, Cassie Freer, MD  Multiple Vitamins-Minerals (CULTURELLE PROBIOTICS + MULTIV) CHEW Chew by mouth.    [provider]  Multiple Vitamins-Minerals (ONE-A-DAY WOMENS 50+ ADVANTAGE PO) Take by mouth daily.    [provider]  nitroGLYCERIN (NITROSTAT) 0.4 MG SL tablet Place 1 tablet (0.4 mg total) under the tongue as needed. 10/28/21   O'Neal, Cassie Freer, MD  nystatin cream (MYCOSTATIN) Apply 1 application topically 3 (three) times daily as needed. 02/24/20   [provider]  promethazine-dextromethorphan (PROMETHAZINE-DM) 6.25-15 MG/5ML syrup Take 2.5 mLs by mouth 4 (four) times daily as needed for cough. 03/15/22   Waunita Schooner, MD    Family History Family History  Problem Relation Age of Onset   Heart failure Mother    Hypothyroidism Mother    Aneurysm Mother    Stroke Mother    Mitral valve prolapse Mother    Lung cancer Mother        smoker   Arthritis Father    Heart disease Father    Heart murmur Father     Diabetes Father    Hypertension Father    COPD Father    Diabetes Sister    Bladder Cancer Sister    Cataracts Sister    Arthritis Sister    Osteopenia Sister    Irritable bowel syndrome Sister    Mitral valve prolapse Sister    Fibroids Sister    Lung cancer Sister        smoker   Mitral valve prolapse Sister    Hypertension Sister    Bradycardia Sister        w/ pacemaker   Crohn's disease Sister    Stroke Sister    Liver cancer Sister    Colon cancer Maternal Uncle  3 uncles    Pancreatic cancer Maternal Uncle        2 uncles with both colon and pancreatic   Diabetes Maternal Grandmother    Breast cancer Neg Hx     Social History Social History   Tobacco Use   Smoking status: Never   Smokeless tobacco: Never  Vaping Use   Vaping Use: Never used  Substance Use Topics   Alcohol use: Yes    Comment: twice a week, 1 drink   Drug use: Never     Allergies   Cymbalta [duloxetine hcl], Duloxetine, Latex, and Wound dressing adhesive   Review of Systems Review of Systems  Respiratory:  Positive for cough.   Neurological:  Positive for headaches.     Physical Exam Triage Vital Signs ED Triage Vitals [06/19/22 0935]  Enc Vitals Group     BP 113/74     Pulse Rate 80     Resp 15     Temp 98.2 F (36.8 C)     Temp src      SpO2 98 %     Weight      Height      Head Circumference      Peak Flow      Pain Score      Pain Loc      Pain Edu?      Excl. in [place]?    No data found.  Updated Vital Signs BP 113/74   Pulse 80   Temp 98.2 F (36.8 C)   Resp 15   SpO2 98%   Visual Acuity Right Eye Distance:   Left Eye Distance:   Bilateral Distance:    Right Eye Near:   Left Eye Near:    Bilateral Near:     Physical Exam Vitals reviewed.  Constitutional:      Appearance: She is well-developed. She is ill-appearing.  HENT:     Mouth/Throat:     Mouth: Mucous membranes are moist.     Pharynx: Posterior oropharyngeal erythema present. No  oropharyngeal exudate.  Cardiovascular:     Rate and Rhythm: Normal rate and regular rhythm.     Heart sounds: Normal heart sounds.  Pulmonary:     Effort: Pulmonary effort is normal.     Breath sounds: Normal breath sounds.  Neurological:     Mental Status: She is alert and oriented to person, place, and time.  Psychiatric:        Mood and Affect: Mood normal.        Behavior: Behavior normal.      UC Treatments / Results  Labs (all labs ordered are listed, but only abnormal results are displayed) Labs Reviewed - No data to display  EKG   Radiology No results found.  Procedures Procedures (including critical care time)  Medications Ordered in UC Medications - No data to display  Initial Impression / Assessment and Plan / UC Course  I have reviewed the triage vital signs and the nursing notes.  Pertinent labs & imaging results that were available during my care of the patient were reviewed by me and considered in my medical decision making (see chart for details).   Patient is afebrile here without recent antipyretics. Satting well on room air. Overall is ill appearing, non toxic. She appears well hydrated, without respiratory distress. Pulmonary exam is remarkable only for severe cough triggered by deep breathing.  Lungs CTAB.  Some pharyngeal erythema likely secondary to PND, without peritonsillar exudates.  Symptoms now x 8 days.  Likely still viral however given patient has started amoxicillin and is presenting to urgent care, will cover for possible secondary bacterial sinusitis.  Likely bronchitis as well and will treat with Medrol Dosepak at her request.  Final Clinical Impressions(s) / UC Diagnoses   Final diagnoses:  None   Discharge Instructions   None    ED Prescriptions   None    PDMP not reviewed this encounter.   Rose Phi,  06/19/22 1009

## 2022-06-19 NOTE — Discharge Instructions (Signed)
Follow up here or with your primary care provider if your symptoms are worsening or not improving with treatment.     

## 2022-06-20 ENCOUNTER — Telehealth: Payer: Self-pay | Admitting: Cardiovascular Disease

## 2022-06-20 NOTE — Telephone Encounter (Signed)
Patient aware of MD recommendations and verbalized understanding.

## 2022-06-20 NOTE — Telephone Encounter (Signed)
Received call from patient-patient reports having elevated HR last night.   She reports having similar episodes before.  Reports HR was 185.   She reports doing valsalva, cool rag, relaxation techniques without resolution.    She reports taking additional 25 mg toprol and after 1.5 hours, episode resolved and HR decreased to 70s.    Reports feeling okay this morning, HR 68.     She states she has had bronchitis and is on an abx for this.  Reports being started on steroid dose pack yesterday as well.     Advised most likely triggered by sickness and medication.   She wanted to make Dr. Audie Box aware as this was the longest episode she has had.   She also would like to know if it is ok to continue taking PRN metoprolol and if the elevated HR continued if she should take a second dose.   Advised would send message to Dr. Audie Box.  ER precautions discussed as well.

## 2022-06-20 NOTE — Telephone Encounter (Signed)
STAT if HR is under 50 or over 120 (normal HR is 60-100 beats per minute)  What is your heart rate? 185, 178, 165  Do you have a log of your heart rate readings (document readings)? Yes   Do you have any other symptoms? Left arm felt different, tingly fingertips

## 2022-06-27 ENCOUNTER — Other Ambulatory Visit: Payer: Self-pay | Admitting: Cardiovascular Disease

## 2022-06-27 DIAGNOSIS — M25512 Pain in left shoulder: Secondary | ICD-10-CM | POA: Diagnosis not present

## 2022-06-27 DIAGNOSIS — M25511 Pain in right shoulder: Secondary | ICD-10-CM | POA: Diagnosis not present

## 2022-06-27 DIAGNOSIS — M189 Osteoarthritis of first carpometacarpal joint, unspecified: Secondary | ICD-10-CM | POA: Diagnosis not present

## 2022-06-28 ENCOUNTER — Encounter: Payer: Self-pay | Admitting: Family Medicine

## 2022-06-28 ENCOUNTER — Ambulatory Visit (INDEPENDENT_AMBULATORY_CARE_PROVIDER_SITE_OTHER): Payer: Medicare HMO | Admitting: Family Medicine

## 2022-06-28 VITALS — BP 122/78 | HR 75 | Temp 97.4°F | Ht 63.0 in | Wt 198.2 lb

## 2022-06-28 DIAGNOSIS — I2510 Atherosclerotic heart disease of native coronary artery without angina pectoris: Secondary | ICD-10-CM | POA: Diagnosis not present

## 2022-06-28 DIAGNOSIS — E782 Mixed hyperlipidemia: Secondary | ICD-10-CM | POA: Diagnosis not present

## 2022-06-28 DIAGNOSIS — E041 Nontoxic single thyroid nodule: Secondary | ICD-10-CM | POA: Diagnosis not present

## 2022-06-28 DIAGNOSIS — Z87898 Personal history of other specified conditions: Secondary | ICD-10-CM | POA: Insufficient documentation

## 2022-06-28 DIAGNOSIS — Z5181 Encounter for therapeutic drug level monitoring: Secondary | ICD-10-CM

## 2022-06-28 DIAGNOSIS — Z79890 Hormone replacement therapy (postmenopausal): Secondary | ICD-10-CM

## 2022-06-28 DIAGNOSIS — E063 Autoimmune thyroiditis: Secondary | ICD-10-CM

## 2022-06-28 DIAGNOSIS — R053 Chronic cough: Secondary | ICD-10-CM | POA: Insufficient documentation

## 2022-06-28 DIAGNOSIS — M8000XS Age-related osteoporosis with current pathological fracture, unspecified site, sequela: Secondary | ICD-10-CM

## 2022-06-28 DIAGNOSIS — E038 Other specified hypothyroidism: Secondary | ICD-10-CM | POA: Diagnosis not present

## 2022-06-28 NOTE — Assessment & Plan Note (Signed)
Continue Prolia, due for 16-monthinjection.   Last DEXA: 02/2022

## 2022-06-28 NOTE — Progress Notes (Signed)
Patient ID: [name], female    DOB: June 20, 1951, 71 y.o.   MRN: [id number]  This visit was conducted in person.  BP 122/78 (BP Location: Left Arm, Patient Position: Sitting)   Pulse 75   Temp (!) 97.4 F (36.3 C) (Skin)   Ht '5\' 3"'$  (1.6 m)   Wt 198 lb 4 oz (89.9 kg)   SpO2 97%   BMI 35.12 kg/m    CC:  Chief Complaint  Patient presents with   Transitions Of Care    Subjective:     HPI: Samantha Perkins is a 71 y.o. female presenting on 06/28/2022 for Transitions Of Care  TOC from Dr. Einar Pheasant  Last CPX:12/21/2021 reviewed past notes in detail  Has noted memory issues since COVID infection.     Hx of hear racing.. using toprol.  TAH years ago.. for fibroids. .  hx of  cervical dysplasia ON HRT: on estradiol.Samantha Perkins trying to wean off.   Recent bronchitis  starting 05/28/22.Samantha Perkins treated by  Dr. Silvio Pate.. treated  Amox, benzonatate. Seen again at urgent care.. treated Augmentin and steroid dose pack.  Neg COVID home test.  She had improved but coughing fits and post nasal drip. Yeloow mucus.  No fever.  No history of COPD.  Does not feel bad just cannot stop coughing.   CAd.. followed by Dr. Audie Box.   Hypothyroidism: Followed by ENDO. Lab Results  Component Value Date   TSH 1.26 12/02/2021    Elevated Cholesterol:  CAD LDL at goal < 70.. on atorvastatin 20 mg daily Lab Results  Component Value Date   CHOL 110 09/15/2021   HDL 63.30 09/15/2021   LDLCALC 27 09/15/2021   LDLDIRECT 91 09/06/2020   TRIG 99.0 09/15/2021   CHOLHDL 2 09/15/2021  Using medications without problems: Muscle aches: yes Diet compliance: heart healthy diet Exercise:none recently.. usually at [facility]. Other complaints:   Relevant past medical, surgical, family and social history reviewed and updated as indicated. Interim medical history since our last visit reviewed. Allergies and medications reviewed and updated. Outpatient Medications Prior to Visit  Medication Sig Dispense Refill    aspirin EC 81 MG tablet Take 81 mg by mouth daily. Swallow whole.     atorvastatin (LIPITOR) 20 MG tablet Take 1 tablet by mouth once daily 90 tablet 3   cetirizine (ZYRTEC) 10 MG tablet Take 10 mg by mouth daily as needed for allergies.     clobetasol (TEMOVATE) 0.05 % GEL Apply topically 2 (two) times daily as needed.     clotrimazole-betamethasone (LOTRISONE) cream Apply topically 2 (two) times daily as needed.     denosumab (PROLIA) 60 MG/ML SOSY injection Inject 60 mg into the skin every 6 (six) months. 1 mL 0   desonide (DESOWEN) 0.05 % cream Apply topically as needed.     diclofenac Sodium (VOLTAREN) 1 % GEL Voltaren Arthritis Pain 1 % topical gel     Docosanol (ABREVA EX) Apply topically 3 (three) times daily as needed.     estradiol (ESTRACE) 0.5 MG tablet Take 1 tablet by mouth once daily (Patient taking differently: 4 times a week (weaning off)) 90 tablet 0   ezetimibe (ZETIA) 10 MG tablet Take 1 tablet by mouth once daily 90 tablet 3   Lactobacillus-Inulin (CULTURELLE ADULT ULT BALANCE PO) Culturelle     levothyroxine (SYNTHROID) 100 MCG tablet Take 1 tablet (100 mcg total) by mouth daily. 90 tablet 3   metoprolol succinate (TOPROL-XL) 25 MG 24 hr tablet Take  1 tablet (25 mg total) by mouth daily. (Patient taking differently: Take 25 mg by mouth. Take 1/2 by mouth daily) 90 tablet 1   Multiple Vitamins-Minerals (ONE-A-DAY WOMENS 50+ ADVANTAGE PO) Take by mouth daily.     nitroGLYCERIN (NITROSTAT) 0.4 MG SL tablet Place 1 tablet (0.4 mg total) under the tongue as needed. 25 tablet 1   amoxicillin-clavulanate (AUGMENTIN) 875-125 MG tablet Take 1 tablet by mouth every 12 (twelve) hours. (Patient not taking: Reported on 06/28/2022) 14 tablet 0   azelastine (ASTELIN) 0.1 % nasal spray as needed. (Patient not taking: Reported on 06/28/2022)     benzonatate (TESSALON) 200 MG capsule Take 1 capsule (200 mg total) by mouth 3 (three) times daily as needed for cough. (Patient not taking: Reported  on 06/28/2022) 60 capsule 0   L-Lysine 500 MG TABS L-Lysine 500 mg tablet (Patient not taking: Reported on 06/28/2022)     methylPREDNISolone (MEDROL DOSEPAK) 4 MG TBPK tablet Steroid taper. Take as directed by packaging. (Patient not taking: Reported on 06/28/2022) 21 tablet 0   Multiple Vitamins-Minerals (CULTURELLE PROBIOTICS + MULTIV) CHEW Chew by mouth. (Patient not taking: Reported on 06/28/2022)     nystatin cream (MYCOSTATIN) Apply 1 application topically 3 (three) times daily as needed. (Patient not taking: Reported on 06/28/2022)     promethazine-dextromethorphan (PROMETHAZINE-DM) 6.25-15 MG/5ML syrup Take 2.5 mLs by mouth 4 (four) times daily as needed for cough. (Patient not taking: Reported on 06/28/2022) 118 mL 0   SPIKEVAX injection  (Patient not taking: Reported on 06/28/2022)     No facility-administered medications prior to visit.    History obtained from  Per HPI unless specifically indicated in ROS section below Review of Systems  Constitutional:  Negative for fatigue and fever.  HENT:  Negative for congestion.   Eyes:  Negative for pain.  Respiratory:  Positive for cough. Negative for shortness of breath.   Cardiovascular:  Negative for chest pain, palpitations and leg swelling.  Gastrointestinal:  Negative for abdominal pain.  Genitourinary:  Negative for dysuria and vaginal bleeding.  Musculoskeletal:  Negative for back pain.  Neurological:  Negative for syncope, light-headedness and headaches.  Psychiatric/Behavioral:  Negative for dysphoric mood.    Objective:  BP 122/78 (BP Location: Left Arm, Patient Position: Sitting)   Pulse 75   Temp (!) 97.4 F (36.3 C) (Skin)   Ht '5\' 3"'$  (1.6 m)   Wt 198 lb 4 oz (89.9 kg)   SpO2 97%   BMI 35.12 kg/m   Wt Readings from Last 3 Encounters:  06/28/22 198 lb 4 oz (89.9 kg)  06/15/22 199 lb (90.3 kg)  04/27/22 203 lb 6 oz (92.3 kg)      Physical Exam Vitals and nursing note reviewed.  Constitutional:      General:  She is not in acute distress.    Appearance: Normal appearance. She is well-developed. She is not ill-appearing or toxic-appearing.  HENT:     Head: Normocephalic.     Right Ear: Hearing, tympanic membrane, ear canal and external ear normal.     Left Ear: Hearing, tympanic membrane, ear canal and external ear normal.     Nose: Nose normal.  Eyes:     General: Lids are normal. Lids are everted, no foreign bodies appreciated.     Conjunctiva/sclera: Conjunctivae normal.     Pupils: Pupils are equal, round, and reactive to light.  Neck:     Thyroid: No thyroid mass or thyromegaly.     Vascular: No carotid  bruit.     Trachea: Trachea normal.  Cardiovascular:     Rate and Rhythm: Normal rate and regular rhythm.     Heart sounds: Normal heart sounds, S1 normal and S2 normal. No murmur heard.    No gallop.  Pulmonary:     Effort: Pulmonary effort is normal. No respiratory distress.     Breath sounds: Normal breath sounds. No wheezing, rhonchi or rales.  Abdominal:     General: Bowel sounds are normal. There is no distension or abdominal bruit.     Palpations: Abdomen is soft. There is no fluid wave or mass.     Tenderness: There is no abdominal tenderness. There is no guarding or rebound.     Hernia: No hernia is present.  Musculoskeletal:     Cervical back: Normal range of motion and neck supple.  Lymphadenopathy:     Cervical: No cervical adenopathy.  Skin:    General: Skin is warm and dry.     Findings: No rash.  Neurological:     Mental Status: She is alert.     Cranial Nerves: No cranial nerve deficit.     Sensory: No sensory deficit.  Psychiatric:        Mood and Affect: Mood is not anxious or depressed.        Speech: Speech normal.        Behavior: Behavior normal. Behavior is cooperative.        Judgment: Judgment normal.       Results for orders placed or performed in visit on 02/63/78  Basic Metabolic Panel  Result Value Ref Range   Sodium 139 135 - 145 mEq/L    Potassium 4.4 3.5 - 5.1 mEq/L   Chloride 103 96 - 112 mEq/L   CO2 30 19 - 32 mEq/L   Glucose, Bld 85 70 - 99 mg/dL   BUN 12 6 - 23 mg/dL   Creatinine, Ser 0.84 0.40 - 1.20 mg/dL   GFR 70.19 >60.00 mL/min   Calcium 9.4 8.4 - 10.5 mg/dL     COVID 19 screen:  No recent travel or known exposure to COVID19 The patient denies respiratory symptoms of COVID 19 at this time. The importance of social distancing was discussed today.   Assessment and Plan    Problem List Items Addressed This Visit     Age-related osteoporosis with current pathological fracture    Continue Prolia, due for 90-monthinjection.   Last DEXA: 02/2022      CAD (coronary artery disease)     Per pt 2007..Samantha Kitchenattempted stents but unable to place them without increased risk.  Followed by Cardiology Dr. OAudie Box      Encounter for monitoring postmenopausal estrogen replacement therapy    Trying to wean off estradiol 0.5 mg as able... will plan to decrease to 3 days a week as able.      History of palpitations     Using BBlocker, followed by cardiology.      Hyperlipidemia     CAD LDL at goal < 70.. on atorvastatin 20 mg daily  Can try coQ10 for statin SE.      Hypothyroidism due to Hashimoto's thyroiditis - Primary    Stable, chronic.  Continue current medication.   S/P partial thyroidectomy 2009  Levo 100 mcg daily      Persistent cough   Severe obesity with body mass index (BMI) of 35.0 to 39.9 with comorbidity [place]    Encouraged exercise, weight loss, healthy eating  habits.  Working on [employer] planning to restart [employer].      Thyroid nodule    S/P thyroidectomy partial.  Followed by ENDO.        Eliezer Lofts, MD

## 2022-06-28 NOTE — Assessment & Plan Note (Signed)
Using BBlocker, followed by cardiology.

## 2022-06-28 NOTE — Assessment & Plan Note (Addendum)
Stable, chronic.  Continue current medication.   S/P partial thyroidectomy 2009  Levo 100 mcg daily

## 2022-06-28 NOTE — Assessment & Plan Note (Signed)
Trying to wean off estradiol 0.5 mg as able... will plan to decrease to 3 days a week as able.

## 2022-06-28 NOTE — Assessment & Plan Note (Signed)
CAD LDL at goal < 70.. on atorvastatin 20 mg daily  Can try coQ10 for statin SE.

## 2022-06-28 NOTE — Assessment & Plan Note (Signed)
Encouraged exercise, weight loss, healthy eating habits.  Working on [employer] planning to restart [employer].

## 2022-06-28 NOTE — Assessment & Plan Note (Signed)
S/P thyroidectomy partial.  Followed by ENDO.

## 2022-06-28 NOTE — Patient Instructions (Signed)
Can try flonase 2 sprays per nostril daily for persistent cough/ post infectious cough. Can iss cough suppressant at night needed.

## 2022-06-28 NOTE — Assessment & Plan Note (Signed)
Per pt 2007.Marland Kitchen attempted stents but unable to place them without increased risk.  Followed by Cardiology Dr. Audie Box.

## 2022-07-13 ENCOUNTER — Other Ambulatory Visit: Payer: Self-pay | Admitting: Internal Medicine

## 2022-07-13 DIAGNOSIS — E038 Other specified hypothyroidism: Secondary | ICD-10-CM

## 2022-07-18 DIAGNOSIS — M19042 Primary osteoarthritis, left hand: Secondary | ICD-10-CM | POA: Diagnosis not present

## 2022-07-18 DIAGNOSIS — M19041 Primary osteoarthritis, right hand: Secondary | ICD-10-CM | POA: Diagnosis not present

## 2022-07-21 ENCOUNTER — Other Ambulatory Visit (INDEPENDENT_AMBULATORY_CARE_PROVIDER_SITE_OTHER): Payer: Medicare HMO

## 2022-07-21 DIAGNOSIS — E063 Autoimmune thyroiditis: Secondary | ICD-10-CM

## 2022-07-21 DIAGNOSIS — E038 Other specified hypothyroidism: Secondary | ICD-10-CM | POA: Diagnosis not present

## 2022-07-21 LAB — TSH: TSH: 2.65 u[IU]/mL (ref 0.35–5.50)

## 2022-07-28 ENCOUNTER — Ambulatory Visit: Payer: Medicare HMO | Admitting: Internal Medicine

## 2022-07-28 ENCOUNTER — Encounter: Payer: Self-pay | Admitting: Internal Medicine

## 2022-07-28 VITALS — BP 110/70 | HR 80 | Ht 63.0 in | Wt 199.0 lb

## 2022-07-28 DIAGNOSIS — E038 Other specified hypothyroidism: Secondary | ICD-10-CM

## 2022-07-28 DIAGNOSIS — E063 Autoimmune thyroiditis: Secondary | ICD-10-CM

## 2022-07-28 MED ORDER — LEVOTHYROXINE SODIUM 100 MCG PO TABS: 100.0000 ug | ORAL_TABLET | Freq: Every day | ORAL | 3 refills | Status: DC

## 2022-07-28 NOTE — Patient Instructions (Signed)

## 2022-07-28 NOTE — Progress Notes (Signed)
Name: Samantha Perkins  MRN/ DOB: [id number], Feb 09, 1951    Age/ Sex: 72 y.o., female     PCP: Jinny Sanders, MD   Reason for Endocrinology Evaluation: Hashimoto's Thyroiditis and MNG     Initial Endocrinology Clinic Visit: 09/17/2020    PATIENT IDENTIFIER: Samantha Perkins is a 72 y.o., female with a past medical history of Hashimoto's thyroiditis, Osteopenia, Dyslipidemia and CAD . She has followed with [facility] since 09/17/2020 for consultative assistance with management of her Hashimoto's Thyroiditis and MNG  HISTORICAL SUMMARY:  Moved from [place]   She was diagnosed with hypothyroidism in 2008 S/P left Lobectomy in 2009 for benign thyroid  Nodule nodule , was started on LT-4 replacement in 2009 A few years later Liothyronine was added due to sleep issues     Last thyroid ultrasound 03/2020, no new nodules per pt .   Has hx of several stress fractures with osteopenia for years. She has been on Actonel and Boniva , as of recent has been on Prolia ~ 3 yrs now She is on calcium 600 mg and Vitamin D through [facility]   Of note the pt had  MI in 2007, cardiac cath was unsucceful - follows with Dr. Marisue Ivan    We stopped Liothyronine 01/2021 per pt request    SUBJECTIVE:     Today (07/28/2022):  Samantha Perkins is here for a follow up on postoperative hypothyroidism    She has lost weight through weight watchers Denies local neck swelling , has postnasal drip Denies constipation or diarrhea  One episode of palpitations while on prednisone  No biotin intake   Levothyroxine 100 mcg ,daily    HISTORY:  Past Medical History:  Past Medical History:  Diagnosis Date   Carotid artery occlusion    Coronary artery disease    Distal LCX MI 2007 in [place]. PCI unable to be performed.    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 She has never used smokeless tobacco. She reports current alcohol use. She reports that she does not use drugs. Family History:  Family History  Problem Relation Age of Onset   Heart failure Mother    Hypothyroidism Mother    Aneurysm Mother    Stroke Mother    Mitral valve prolapse Mother    Lung cancer Mother        smoker   Arthritis Father    Heart disease Father    Heart murmur Father    Diabetes Father    Hypertension Father    COPD Father    Diabetes Sister    Bladder Cancer Sister    Cataracts Sister    Arthritis Sister    Osteopenia Sister    Irritable bowel syndrome Sister    Mitral valve prolapse Sister  Diabetes Sister    Fibroids Sister    Lung cancer Sister        smoker   Mitral valve prolapse Sister    Hypertension Sister    Bradycardia Sister        w/ pacemaker   Crohn's disease Sister    Stroke Sister    Liver cancer Sister    Colon cancer Maternal Uncle        3 uncles    Pancreatic cancer Maternal Uncle        2 uncles with both colon and pancreatic   Diabetes Maternal Grandmother    Breast cancer Neg Hx      HOME MEDICATIONS: Allergies as of 07/28/2022       Reactions   Cymbalta [duloxetine Hcl] Rash   Duloxetine    Latex    Other reaction(s): Blisters   Wound Dressing Adhesive Other (See Comments)        Medication List        Accurate as of July 28, 2022  4:15 PM. If you have any questions, ask your nurse or doctor.          ABREVA EX Apply topically 3 (three) times daily as needed.   aspirin EC 81 MG tablet Take 81 mg  by mouth daily. Swallow whole.   atorvastatin 20 MG tablet Commonly known as: LIPITOR Take 1 tablet by mouth once daily   cetirizine 10 MG tablet Commonly known as: ZYRTEC Take 10 mg by mouth daily as needed for allergies.   clobetasol 0.05 % Gel Commonly known as: TEMOVATE Apply topically 2 (two) times daily as needed.   clotrimazole-betamethasone cream Commonly known as: LOTRISONE Apply topically 2 (two) times daily as needed.   CoQ-10 200 MG Caps Take 1 tablet by mouth daily at 6 (six) AM.   CULTURELLE ADULT ULT BALANCE PO Culturelle   denosumab 60 MG/ML Sosy injection Commonly known as: PROLIA Inject 60 mg into the skin every 6 (six) months.   desonide 0.05 % cream Commonly known as: DESOWEN Apply topically as needed.   diclofenac Sodium 1 % Gel Commonly known as: VOLTAREN Voltaren Arthritis Pain 1 % topical gel   estradiol 0.5 MG tablet Commonly known as: ESTRACE Take 1 tablet by mouth once daily What changed:  how much to take how to take this when to take this additional instructions   ezetimibe 10 MG tablet Commonly known as: ZETIA Take 1 tablet by mouth once daily   levothyroxine 100 MCG tablet Commonly known as: SYNTHROID Take 1 tablet (100 mcg total) by mouth daily.   metoprolol succinate 25 MG 24 hr tablet Commonly known as: TOPROL-XL Take 1 tablet by mouth once daily   nitroGLYCERIN 0.4 MG SL tablet Commonly known as: NITROSTAT Place 1 tablet (0.4 mg total) under the tongue as needed.   ONE-A-DAY WOMENS 50+ ADVANTAGE PO Take by mouth daily.          OBJECTIVE:   PHYSICAL EXAM: VS: BP 110/70 (BP Location: Left Arm, Patient Position: Sitting, Cuff Size: Large)   Pulse 80   Ht '5\' 3"'$  (1.6 m)   Wt 199 lb (90.3 kg)   SpO2 97%   BMI 35.25 kg/m    EXAM: General: Pt appears well and is in NAD  Neck: General: Supple without adenopathy. Thyroid: Thyroid size normal.  No goiter or nodules appreciated.  Lungs: Clear with good BS  bilat   Heart: Auscultation: RRR.  Abdomen: soft, nontender, without masses or organomegaly  palpable  Extremities:  BL LE: No pretibial edema normal ROM and strength.  Mental Status:  Mood and affect: No depression, anxiety, or agitation     DATA REVIEWED:  Latest Reference Range & Units 07/21/22 08:34  TSH 0.35 - 5.50 uIU/mL 2.65      ASSESSMENT / PLAN / RECOMMENDATIONS:   Hashimoto's Thyroiditis:   -Praised the patient on weight loss -TSH is normal -No changes   Medications :  Continue levothyroxine 100 MCG daily   2. S/P left Lobectomy :   - S/P left lobectomy in 2009 secondary to Benign reasons - Per pt she has been getting annual ultrasounds, these records not available. - Repeat Ultrasound showed heterogenous gland without cysts or nodules  -Reassurance provided  -We will continue to monitor clinically and have low threshold for repeat ultrasound        F/U in 1 yr    Signed electronically by: Mack Guise, MD  [facility]  [address] [place], [place] [address] Phone: [phone] FAX: [phone]      CC: Jinny Sanders, MD [place] [address] Phone: [phone]  Fax: [phone]   Return to Endocrinology clinic as below: Future Appointments  Date Time Provider Allison  12/18/2022  7:45 AM [facility]  12/19/2022  8:15 AM [facility] NURSE HEALTH ADVISOR [facility]  12/26/2022  8:40 AM Jinny Sanders, MD [facility]  08/01/2023 11:10 AM Tanor Glaspy, Melanie Crazier, MD [facility] None

## 2022-08-17 DIAGNOSIS — M1811 Unilateral primary osteoarthritis of first carpometacarpal joint, right hand: Secondary | ICD-10-CM | POA: Diagnosis not present

## 2022-08-17 DIAGNOSIS — M65352 Trigger finger, left little finger: Secondary | ICD-10-CM | POA: Diagnosis not present

## 2022-09-10 NOTE — Progress Notes (Unsigned)
Cardiology Office Note:   Date:  09/12/2022  NAME:  Samantha Perkins    MRN: [id number] DOB:  06-Jul-1951   PCP:  Jinny Sanders, MD  Cardiologist:  None  Electrophysiologist:  None   Referring MD: Jinny Sanders, MD   Chief Complaint  Patient presents with   Follow-up    12 months.     History of Present Illness:   Samantha Perkins is a 72 y.o. female with a hx of CAD, carotid artery disease, PACs, HLD who presents for follow-up.  She reports she had a viral URI in December.  Had an episode of heart racing.  Took an extra dose of metoprolol and symptoms improved.  She has had no further episodes.  Her blood pressure is well-controlled.  Lipids are at goal.  Denies any chest pain or trouble breathing.  She has not been exercising that much.  Her husband has Parkinson's disease.  She is his caregiver.  She reports she is going to get more active.  Regardless denies any major symptoms in office today.  A1c last year was 6.4.  No symptoms of chest pain or trouble breathing.  Vital signs are stable.  EKG shows PACs which are not new.  She has lost roughly 30 pounds over the past year.  Doing well.  I congratulated her on this.  Problem List 1. CAD -Small inferior infarct 01/28/2006 with totally occluded distal circumflex was unsuccessful angioplasty [facility]) -Cardiac PET 04/14/2020 ([place]): Large, fixed perfusion defect of the lateral wall, no ischemia, findings consistent with prior MI, EF 59% 2. HLD -T chol 110, HDL 63, LDL 27, TG 99 3. Hypothyroidism  4. Osteopenia 5.  PACs -Treated with metoprolol 6.  Carotid artery disease -40 to 59% left ICA (2020; [place]) -no disease 09/08/2020  Past Medical History: Past Medical History:  Diagnosis Date   Carotid artery occlusion    Coronary artery disease    Distal LCX MI 2007 in [place]. PCI unable to be performed.    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 Swallow whole.   atorvastatin (LIPITOR) 20 MG tablet Take 1 tablet by mouth once daily   cetirizine (ZYRTEC) 10 MG tablet Take 10 mg by mouth daily as needed for allergies.   clobetasol (TEMOVATE) 0.05 % GEL Apply topically 2 (two) times daily as needed.   clotrimazole-betamethasone (LOTRISONE) cream Apply topically 2 (two) times daily as needed.   Coenzyme Q10 (COQ-10) 200 MG CAPS Take 1 tablet by mouth daily at 6 (six) AM.   denosumab (PROLIA) 60 MG/ML SOSY injection Inject 60 mg into the skin every 6 (six) months.   desonide (DESOWEN) 0.05 % cream Apply topically as needed.   diclofenac  Sodium (VOLTAREN) 1 % GEL Voltaren Arthritis Pain 1 % topical gel   Docosanol (ABREVA EX) Apply topically 3 (three) times daily as needed.   estradiol (ESTRACE) 0.5 MG tablet Take 1 tablet by mouth once daily (Patient taking differently: 3 times a week (weaning off))   ezetimibe (ZETIA) 10 MG tablet Take 1 tablet by mouth once daily   Lactobacillus-Inulin (CULTURELLE ADULT ULT BALANCE PO) Culturelle   levothyroxine (SYNTHROID) 100 MCG tablet Take 1 tablet (100 mcg total) by mouth daily.   metoprolol succinate (TOPROL-XL) 25 MG 24 hr tablet Take 1 tablet by mouth once daily (Patient taking differently: Take 12.5 mg by mouth daily.)   Multiple Vitamins-Minerals (ONE-A-DAY WOMENS 50+ ADVANTAGE PO) Take by mouth daily.   nitroGLYCERIN (NITROSTAT) 0.4 MG SL tablet Place 1  tablet (0.4 mg total) under the tongue as needed.     Allergies:    Cymbalta [duloxetine hcl], Duloxetine, Latex, and Wound dressing adhesive   Social History: Social History   Socioeconomic History   Marital status: Married    Spouse name: Hiram Comber   Number of children: 0   Years of education: master's degree   Highest education level: Not on file  Occupational History   Occupation: retired Pharmacist, hospital  Tobacco Use   Smoking status: Never   Smokeless tobacco: Never  Vaping Use   Vaping Use: Never used  Substance and Sexual Activity   Alcohol use: Yes    Comment: once every two weeks a glass of wine   Drug use: Never   Sexual activity: Not Currently    Birth control/protection: Surgical  Other Topics Concern   Not on file  Social History Narrative   07/26/20   From: [place], moved to be near children   Living: with husband, Hiram Comber (1980)   Work: retired - Lexicographer      Family: step daughter - Product manager - 2 step granddaughters - in the area      Enjoys: Environmental education officer, explore new places, reading, volunteering       Exercise: not currently, joining the [employer]   Diet: tries to eat healthy, not as good as she did before      Safety   Seat belts: Yes    Guns: No   Safe in relationships: Yes    Social Determinants of Radio broadcast assistant Strain: Low Risk  (11/24/2021)   Overall Financial Resource Strain (CARDIA)    Difficulty of Paying Living Expenses: Not hard at all  Food Insecurity: No Food Insecurity (11/24/2021)   Hunger Vital Sign    Worried About Running Out of Food in the Last Year: Never true    [place] in the Last Year: Never true  Transportation Needs: No Transportation Needs (11/24/2021)   PRAPARE - Hydrologist (Medical): No    Lack of Transportation (Non-Medical): No  Physical Activity: Inactive (11/24/2021)   Exercise Vital Sign    Days of Exercise per Week: 0 days     Minutes of Exercise per Session: 0 min  Stress: No Stress Concern Present (11/24/2021)   [place]    Feeling of Stress : Only a little  Social Connections: Not on file     Family History: The patient's family history includes Aneurysm in her mother; Arthritis in her father and sister; Bladder Cancer in her sister; Bradycardia in her sister; COPD in her father; Cataracts  in her sister; Colon cancer in her maternal uncle; Crohn's disease in her sister; Diabetes in her father, maternal grandmother, sister, and sister; Fibroids in her sister; Heart disease in her father; Heart failure in her mother; Heart murmur in her father; Hypertension in her father and sister; Hypothyroidism in her mother; Irritable bowel syndrome in her sister; Liver cancer in her sister; Lung cancer in her mother and sister; Mitral valve prolapse in her mother, sister, and sister; Osteopenia in her sister; Pancreatic cancer in her maternal uncle; Stroke in her mother and sister. There is no history of Breast cancer.  ROS:   All other ROS reviewed and negative. Pertinent positives noted in the HPI.     EKGs/Labs/Other Studies Reviewed:   The following studies were personally reviewed by me today:  EKG:  EKG is ordered today.  The ekg ordered today demonstrates normal sinus rhythm heart rate 80, PACs which are apparent, nonspecific ST-T changes, and was personally reviewed by me.   Recent Labs: 09/15/2021: ALT 24; Hemoglobin 13.0; Platelets 207.0 04/06/2022: BUN 12; Creatinine, Ser 0.84; Potassium 4.4; Sodium 139 07/21/2022: TSH 2.65   Recent Lipid Panel    Component Value Date/Time   CHOL 110 09/15/2021 1037   CHOL 128 11/09/2020 0911   TRIG 99.0 09/15/2021 1037   HDL 63.30 09/15/2021 1037   HDL 56 11/09/2020 0911   CHOLHDL 2 09/15/2021 1037   VLDL 19.8 09/15/2021 1037   LDLCALC 27 09/15/2021 1037   LDLCALC 53 11/09/2020 0911   LDLDIRECT 91  09/06/2020 1001   LDLDIRECT 73.0 07/26/2020 1610    Physical Exam:   VS:  BP 106/64 (BP Location: Left Arm, Patient Position: Sitting, Cuff Size: Large)   Pulse 80   Ht '5\' 3"'$  (1.6 m)   Wt 197 lb (89.4 kg)   BMI 34.90 kg/m    Wt Readings from Last 3 Encounters:  09/12/22 197 lb (89.4 kg)  07/28/22 199 lb (90.3 kg)  06/28/22 198 lb 4 oz (89.9 kg)    General: Well nourished, well developed, in no acute distress Head: Atraumatic, normal size  Eyes: PEERLA, EOMI  Neck: Supple, no JVD Endocrine: No thryomegaly Cardiac: Normal S1, S2; RRR; no murmurs, rubs, or gallops Lungs: Clear to auscultation bilaterally, no wheezing, rhonchi or rales  Abd: Soft, nontender, no hepatomegaly  Ext: No edema, pulses 2+ Musculoskeletal: No deformities, BUE and BLE strength normal and equal Skin: Warm and dry, no rashes   Neuro: Alert and oriented to person, place, time, and situation, CNII-XII grossly intact, no focal deficits  Psych: Normal mood and affect   ASSESSMENT:   Samantha Perkins is a 72 y.o. female who presents for the following: 1. Coronary artery disease involving native coronary artery of native heart without angina pectoris   2. Mixed hyperlipidemia   3. Bilateral carotid artery stenosis   4. Obesity (BMI 30-39.9)   5. Tachycardia     PLAN:   1. Coronary artery disease involving native coronary artery of native heart without angina pectoris 2. Mixed hyperlipidemia -No symptoms of angina.  Continue aspirin 81 mg daily.  On Lipitor 20 mg daily.  On Zetia 10 mg daily.  Most recent LDL cholesterol 27 which is at goal.  Will continue with his current regimen.  3. Bilateral carotid artery stenosis -She did have carotid artery disease diagnosed in [place].  Minimal plaque here.  Continue aspirin and lipid-lowering agents.  4. Obesity (BMI 30-39.9) -She has lost 30 pounds.  Plans to lose more.  I congratulated her on this.  5. Tachycardia -Episode of in the setting of prednisone  use.  No further episodes.  We discussed the Kardia mobile device.  Unclear if this was an arrhythmia or not.  She will let us know if symptoms return.  Disposition: Return in about 1 year (around 09/13/2023).  Medication Adjustments/Labs and Tests Ordered: Current medicines are reviewed at length with the patient today.  Concerns regarding medicines are outlined above.  Orders Placed This Encounter  Procedures   EKG 12-Lead   No orders of the defined types were placed in this encounter.   Patient Instructions  Medication Instructions:  The current medical regimen is effective;  continue present plan and medications.  *If you need a refill on your cardiac medications before your next appointment, please call your pharmacy*  Follow-Up: At [facility], you and your health needs are our priority.  As part of our continuing mission to provide you with exceptional heart care, we have created designated Provider Care Teams.  These Care Teams include your primary Cardiologist (physician) and Advanced Practice Providers (APPs -  Physician Assistants and Nurse Practitioners) who all work together to provide you with the care you need, when you need it.  We recommend signing up for the patient portal called "MyChart".  Sign up information is provided on this After Visit Summary.  MyChart is used to connect with patients for Virtual Visits (Telemedicine).  Patients are able to view lab/test results, encounter notes, upcoming appointments, etc.  Non-urgent messages can be sent to your provider as well.   To learn more about what you can do with MyChart, go to [email or web address].    Your next appointment:   12 month(s)  Provider:   Eleonore Chiquito, MD     Time Spent with Patient: I have spent a total of 25 minutes with patient reviewing hospital notes, telemetry, EKGs, labs and examining the patient as well as establishing an assessment and plan that was discussed with the patient.  >  50% of time was spent in direct patient care.  Signed, Addison Naegeli. Audie Box, MD, [place]  [address] [place], [place] [address] [phone]  09/12/2022 11:08 AM

## 2022-09-11 ENCOUNTER — Ambulatory Visit: Payer: Medicare HMO | Admitting: Cardiovascular Disease

## 2022-09-12 ENCOUNTER — Encounter: Payer: Self-pay | Admitting: Cardiovascular Disease

## 2022-09-12 ENCOUNTER — Ambulatory Visit: Payer: Medicare HMO | Attending: Cardiovascular Disease | Admitting: Cardiovascular Disease

## 2022-09-12 VITALS — BP 106/64 | HR 80 | Ht 63.0 in | Wt 197.0 lb

## 2022-09-12 DIAGNOSIS — E669 Obesity, unspecified: Secondary | ICD-10-CM | POA: Diagnosis not present

## 2022-09-12 DIAGNOSIS — R000 Tachycardia, unspecified: Secondary | ICD-10-CM | POA: Diagnosis not present

## 2022-09-12 DIAGNOSIS — I6523 Occlusion and stenosis of bilateral carotid arteries: Secondary | ICD-10-CM | POA: Diagnosis not present

## 2022-09-12 DIAGNOSIS — E782 Mixed hyperlipidemia: Secondary | ICD-10-CM

## 2022-09-12 DIAGNOSIS — I2510 Atherosclerotic heart disease of native coronary artery without angina pectoris: Secondary | ICD-10-CM | POA: Diagnosis not present

## 2022-09-12 NOTE — Patient Instructions (Signed)
Medication Instructions:  The current medical regimen is effective;  continue present plan and medications.  *If you need a refill on your cardiac medications before your next appointment, please call your pharmacy*  Follow-Up: At [facility], you and your health needs are our priority.  As part of our continuing mission to provide you with exceptional heart care, we have created designated Provider Care Teams.  These Care Teams include your primary Cardiologist (physician) and Advanced Practice Providers (APPs -  Physician Assistants and Nurse Practitioners) who all work together to provide you with the care you need, when you need it.  We recommend signing up for the patient portal called "MyChart".  Sign up information is provided on this After Visit Summary.  MyChart is used to connect with patients for Virtual Visits (Telemedicine).  Patients are able to view lab/test results, encounter notes, upcoming appointments, etc.  Non-urgent messages can be sent to your provider as well.   To learn more about what you can do with MyChart, go to [email or web address].    Your next appointment:   12 month(s)  Provider:   Eleonore Chiquito, MD

## 2022-09-15 DIAGNOSIS — D3132 Benign neoplasm of left choroid: Secondary | ICD-10-CM | POA: Diagnosis not present

## 2022-09-15 DIAGNOSIS — Z961 Presence of intraocular lens: Secondary | ICD-10-CM | POA: Diagnosis not present

## 2022-09-15 DIAGNOSIS — H43813 Vitreous degeneration, bilateral: Secondary | ICD-10-CM | POA: Diagnosis not present

## 2022-09-15 DIAGNOSIS — D23121 Other benign neoplasm of skin of left upper eyelid, including canthus: Secondary | ICD-10-CM | POA: Diagnosis not present

## 2022-09-15 DIAGNOSIS — H35371 Puckering of macula, right eye: Secondary | ICD-10-CM | POA: Diagnosis not present

## 2022-09-15 DIAGNOSIS — H52203 Unspecified astigmatism, bilateral: Secondary | ICD-10-CM | POA: Diagnosis not present

## 2022-09-15 DIAGNOSIS — H524 Presbyopia: Secondary | ICD-10-CM | POA: Diagnosis not present

## 2022-09-18 ENCOUNTER — Telehealth: Payer: Self-pay

## 2022-09-18 NOTE — Telephone Encounter (Signed)
Please start new PA for Prolia for this patient.  Her next inj should be on or after 10/12/2022.  Thanks, Anda Kraft

## 2022-09-21 DIAGNOSIS — M25511 Pain in right shoulder: Secondary | ICD-10-CM | POA: Diagnosis not present

## 2022-09-21 DIAGNOSIS — M25512 Pain in left shoulder: Secondary | ICD-10-CM | POA: Diagnosis not present

## 2022-10-01 DIAGNOSIS — M25511 Pain in right shoulder: Secondary | ICD-10-CM | POA: Diagnosis not present

## 2022-10-03 ENCOUNTER — Telehealth: Payer: Self-pay | Admitting: Family Medicine

## 2022-10-03 ENCOUNTER — Other Ambulatory Visit: Payer: Self-pay | Admitting: Family Medicine

## 2022-10-03 DIAGNOSIS — Z79890 Hormone replacement therapy (postmenopausal): Secondary | ICD-10-CM

## 2022-10-03 MED ORDER — ESTRADIOL 0.5 MG PO TABS: ORAL_TABLET | ORAL | 0 refills | Status: DC

## 2022-10-03 NOTE — Telephone Encounter (Signed)
Prescription Request  10/03/2022  LOV: 06/28/2022  What is the name of the medication or equipment?  estradiol (ESTRACE) 0.5 MG tablet  Have you contacted your pharmacy to request a refill? Yes   Which pharmacy would you like this sent to?  [place], [address] [place] [address] Phone: [phone] Fax: [phone]    Patient notified that their request is being sent to the clinical staff for review and that they should receive a response within 2 business days.   Please advise at Mobile [phone] (mobile)

## 2022-10-03 NOTE — Telephone Encounter (Signed)
Refill sent as requested. 

## 2022-10-09 ENCOUNTER — Ambulatory Visit: Payer: Medicare HMO | Admitting: Cardiovascular Disease

## 2022-10-10 NOTE — Telephone Encounter (Signed)
Following up on prolia auth.

## 2022-10-11 ENCOUNTER — Other Ambulatory Visit (HOSPITAL_COMMUNITY): Payer: Self-pay

## 2022-10-11 NOTE — Telephone Encounter (Signed)
Pharmacy Patient Advocate Encounter   Received notification that prior authorization for Prolia is required/requested for buy and bill.   PA submitted on 10/11/22 to (ins) [employer] via CoverMyMeds Key # [id number] Status is pending

## 2022-10-12 DIAGNOSIS — M75101 Unspecified rotator cuff tear or rupture of right shoulder, not specified as traumatic: Secondary | ICD-10-CM | POA: Diagnosis not present

## 2022-10-12 DIAGNOSIS — M25511 Pain in right shoulder: Secondary | ICD-10-CM | POA: Diagnosis not present

## 2022-10-12 DIAGNOSIS — M25512 Pain in left shoulder: Secondary | ICD-10-CM | POA: Diagnosis not present

## 2022-10-12 NOTE — Telephone Encounter (Signed)
Patient Advocate Encounter  Prior Authorization for Prolia 60mg  has been approved.    PA# [id number] Effective dates: 09/08/20 through 07/17/23

## 2022-11-06 NOTE — Telephone Encounter (Signed)
What is patient's cost for injection?  Please respond to Barnie Mort at [facility].  I am filling in for this week only.  Thank you!

## 2022-11-07 ENCOUNTER — Other Ambulatory Visit (HOSPITAL_COMMUNITY): Payer: Self-pay

## 2022-11-07 NOTE — Telephone Encounter (Signed)
Pt ready for scheduling for Prolia on or after : 11/07/22  Out-of-pocket cost due at time of visit: $327  Primary: [employer] Medicare Prolia co-insurance: 20% Admin fee co-insurance: 20%  Secondary: --- Prolia co-insurance:  Admin fee co-insurance:   Medical Benefit Details: Date Benefits were checked: 10/11/22 Deductible: NO/ Coinsurance: 20%/ Admin Fee: 20%  Prior Auth: APPROVED PA# [id number]  Expiration Date: 07/17/2023   Pharmacy benefit: Copay $95 If patient wants fill through the pharmacy benefit please send prescription to: [employer], and include estimated need by date in rx notes. Pharmacy will ship medication directly to the office.  Patient NOT eligible for Prolia Copay Card. Copay Card can make patient's cost as little as $25. Link to apply: [email or web address]://[email or web address]  ** This summary of benefits is an estimation of the patient's out-of-pocket cost. Exact cost may very based on individual plan coverage.

## 2022-11-12 ENCOUNTER — Other Ambulatory Visit: Payer: Self-pay | Admitting: Cardiovascular Disease

## 2022-11-13 ENCOUNTER — Other Ambulatory Visit: Payer: Self-pay

## 2022-11-13 ENCOUNTER — Other Ambulatory Visit (INDEPENDENT_AMBULATORY_CARE_PROVIDER_SITE_OTHER): Payer: Medicare HMO

## 2022-11-13 DIAGNOSIS — M8000XS Age-related osteoporosis with current pathological fracture, unspecified site, sequela: Secondary | ICD-10-CM

## 2022-11-13 DIAGNOSIS — M8000XD Age-related osteoporosis with current pathological fracture, unspecified site, subsequent encounter for fracture with routine healing: Secondary | ICD-10-CM

## 2022-11-13 MED ORDER — DENOSUMAB 60 MG/ML ~~LOC~~ SOSY: 60.0000 mg | PREFILLED_SYRINGE | Freq: Once | SUBCUTANEOUS | 0 refills | Status: AC

## 2022-11-13 NOTE — Telephone Encounter (Signed)
Called patient will pick up from pharmacy.   Patient lab app 4/29 Prolia app 5/2 Order placed to pharmacy and for labs.

## 2022-11-14 LAB — BASIC METABOLIC PANEL
BUN: 11 mg/dL (ref 6–23)
CO2: 30 mEq/L (ref 19–32)
Calcium: 9.4 mg/dL (ref 8.4–10.5)
Chloride: 99 mEq/L (ref 96–112)
Creatinine, Ser: 0.8 mg/dL (ref 0.40–1.20)
GFR: 74.1 mL/min (ref >60.00)
Glucose, Bld: 88 mg/dL (ref 70–99)
Potassium: 4.6 mEq/L (ref 3.5–5.1)
Sodium: 136 mEq/L (ref 135–145)

## 2022-11-16 ENCOUNTER — Ambulatory Visit: Payer: Medicare HMO

## 2022-11-22 ENCOUNTER — Ambulatory Visit (INDEPENDENT_AMBULATORY_CARE_PROVIDER_SITE_OTHER): Payer: Medicare HMO

## 2022-11-22 DIAGNOSIS — M8000XD Age-related osteoporosis with current pathological fracture, unspecified site, subsequent encounter for fracture with routine healing: Secondary | ICD-10-CM | POA: Diagnosis not present

## 2022-11-22 DIAGNOSIS — M8000XS Age-related osteoporosis with current pathological fracture, unspecified site, sequela: Secondary | ICD-10-CM

## 2022-11-22 MED ORDER — DENOSUMAB 60 MG/ML ~~LOC~~ SOSY
60.0000 mg | PREFILLED_SYRINGE | Freq: Once | SUBCUTANEOUS | Status: AC
  Administered 2022-11-22: 60 mg via SUBCUTANEOUS

## 2022-11-22 NOTE — Progress Notes (Signed)
Per orders of Dr. Kerby Nora, injection of Prolia 60 mg [place] in right arm given by Lewanda Rife. Patient tolerated injection well.

## 2022-11-29 ENCOUNTER — Other Ambulatory Visit: Payer: Self-pay | Admitting: Cardiovascular Disease

## 2022-12-04 ENCOUNTER — Telehealth: Payer: Self-pay | Admitting: *Deleted

## 2022-12-04 DIAGNOSIS — Z1159 Encounter for screening for other viral diseases: Secondary | ICD-10-CM

## 2022-12-04 DIAGNOSIS — E782 Mixed hyperlipidemia: Secondary | ICD-10-CM

## 2022-12-04 NOTE — Telephone Encounter (Signed)
-----   Message from Alvina Chou sent at 12/04/2022 11:52 AM EDT ----- Regarding: Lab orders for Monday, 6.3.24 Patient is scheduled for CPX labs, please order future labs, Thanks , Camelia Eng

## 2022-12-18 ENCOUNTER — Other Ambulatory Visit (INDEPENDENT_AMBULATORY_CARE_PROVIDER_SITE_OTHER): Payer: Medicare HMO

## 2022-12-18 DIAGNOSIS — E782 Mixed hyperlipidemia: Secondary | ICD-10-CM | POA: Diagnosis not present

## 2022-12-18 DIAGNOSIS — Z1159 Encounter for screening for other viral diseases: Secondary | ICD-10-CM | POA: Diagnosis not present

## 2022-12-18 LAB — COMPREHENSIVE METABOLIC PANEL
ALT: 23 U/L (ref 0–35)
AST: 22 U/L (ref 0–37)
Albumin: 4 g/dL (ref 3.5–5.2)
Alkaline Phosphatase: 54 U/L (ref 39–117)
BUN: 16 mg/dL (ref 6–23)
CO2: 29 mEq/L (ref 19–32)
Calcium: 8.9 mg/dL (ref 8.4–10.5)
Chloride: 102 mEq/L (ref 96–112)
Creatinine, Ser: 0.81 mg/dL (ref 0.40–1.20)
GFR: 72.96 mL/min (ref >60.00)
Glucose, Bld: 104 mg/dL — ABNORMAL HIGH (ref 70–99)
Potassium: 4.6 mEq/L (ref 3.5–5.1)
Sodium: 138 mEq/L (ref 135–145)
Total Bilirubin: 0.5 mg/dL (ref 0.2–1.2)
Total Protein: 6.6 g/dL (ref 6.0–8.3)

## 2022-12-18 LAB — LIPID PANEL
Cholesterol: 102 mg/dL (ref 0–200)
HDL: 56.4 mg/dL (ref >39.00)
LDL Cholesterol: 21 mg/dL (ref 0–99)
NonHDL: 45.98
Total CHOL/HDL Ratio: 2
Triglycerides: 123 mg/dL (ref 0.0–149.0)
VLDL: 24.6 mg/dL (ref 0.0–40.0)

## 2022-12-18 LAB — HEMOGLOBIN A1C: Hgb A1c MFr Bld: 6.1 % (ref 4.6–6.5)

## 2022-12-19 ENCOUNTER — Ambulatory Visit (INDEPENDENT_AMBULATORY_CARE_PROVIDER_SITE_OTHER): Payer: Medicare HMO

## 2022-12-19 VITALS — Wt 194.0 lb

## 2022-12-19 DIAGNOSIS — Z0000 Encounter for general adult medical examination without abnormal findings: Secondary | ICD-10-CM | POA: Diagnosis not present

## 2022-12-19 LAB — HEPATITIS C ANTIBODY: Hepatitis C Ab: NONREACTIVE

## 2022-12-19 NOTE — Progress Notes (Signed)
No critical labs need to be addressed urgently. We will discuss labs in detail at upcoming office visit.   

## 2022-12-19 NOTE — Patient Instructions (Signed)
[name] , Thank you for taking time to come for your Medicare Wellness Visit. I appreciate your ongoing commitment to your health goals. Please review the following plan we discussed and let me know if I can assist you in the future.   These are the goals we discussed:  Goals      Patient Stated     11/24/2021, wants to be consistent with exercise and lose weight        This is a list of the screening recommended for you and due dates:  Health Maintenance  Topic Date Due   Hepatitis C Screening  Never done   Colon Cancer Screening  Never done   COVID-19 Vaccine (7 - 2023-24 season) 12/20/2023*   Flu Shot  02/15/2023   Medicare Annual Wellness Visit  12/19/2023   Mammogram  03/01/2024   DTaP/Tdap/Td vaccine (2 - Td or Tdap) 02/27/2029   Pneumonia Vaccine  Completed   DEXA scan (bone density measurement)  Completed   Zoster (Shingles) Vaccine  Completed   HPV Vaccine  Aged Out  *Topic was postponed. The date shown is not the original due date.    Advanced directives: Advance directive discussed with you today. Even though you declined this today, please call our office should you change your mind, and we can give you the proper paperwork for you to fill out.   Conditions/risks identified: Aim for 30 minutes of exercise or brisk walking, 6-8 glasses of water, and 5 servings of fruits and vegetables each day.   Next appointment: Follow up in one year for your annual wellness visit 12/21/23   Preventive Care 65 Years and Older, Female Preventive care refers to lifestyle choices and visits with your health care provider that can promote health and wellness. What does preventive care include? A yearly physical exam. This is also called an annual well check. Dental exams once or twice a year. Routine eye exams. Ask your health care provider how often you should have your eyes checked. Personal lifestyle choices, including: Daily care of your teeth and gums. Regular physical  activity. Eating a healthy diet. Avoiding tobacco and drug use. Limiting alcohol use. Practicing safe sex. Taking low-dose aspirin every day. Taking vitamin and mineral supplements as recommended by your health care provider. What happens during an annual well check? The services and screenings done by your health care provider during your annual well check will depend on your age, overall health, lifestyle risk factors, and family history of disease. Counseling  Your health care provider may ask you questions about your: Alcohol use. Tobacco use. Drug use. Emotional well-being. Home and relationship well-being. Sexual activity. Eating habits. History of falls. Memory and ability to understand (cognition). Work and work Astronomer. Reproductive health. Screening  You may have the following tests or measurements: Height, weight, and BMI. Blood pressure. Lipid and cholesterol levels. These may be checked every 5 years, or more frequently if you are over 21 years old. Skin check. Lung cancer screening. You may have this screening every year starting at age 19 if you have a 30-pack-year history of smoking and currently smoke or have quit within the past 15 years. Fecal occult blood test (FOBT) of the stool. You may have this test every year starting at age 34. Flexible sigmoidoscopy or colonoscopy. You may have a sigmoidoscopy every 5 years or a colonoscopy every 10 years starting at age 83. Hepatitis C blood test. Hepatitis B blood test. Sexually transmitted disease (STD) testing. Diabetes screening.  This is done by checking your blood sugar (glucose) after you have not eaten for a while (fasting). You may have this done every 1-3 years. Bone density scan. This is done to screen for osteoporosis. You may have this done starting at age 59. Mammogram. This may be done every 1-2 years. Talk to your health care provider about how often you should have regular mammograms. Talk with your  health care provider about your test results, treatment options, and if necessary, the need for more tests. Vaccines  Your health care provider may recommend certain vaccines, such as: Influenza vaccine. This is recommended every year. Tetanus, diphtheria, and acellular pertussis (Tdap, Td) vaccine. You may need a Td booster every 10 years. Zoster vaccine. You may need this after age 2. Pneumococcal 13-valent conjugate (PCV13) vaccine. One dose is recommended after age 22. Pneumococcal polysaccharide (PPSV23) vaccine. One dose is recommended after age 23. Talk to your health care provider about which screenings and vaccines you need and how often you need them. This information is not intended to replace advice given to you by your health care provider. Make sure you discuss any questions you have with your health care provider. Document Released: 07/30/2015 Document Revised: 03/22/2016 Document Reviewed: 05/04/2015 Elsevier Interactive Patient Education  2017 [employer].  Fall Prevention in the Home Falls can cause injuries. They can happen to people of all ages. There are many things you can do to make your home safe and to help prevent falls. What can I do on the outside of my home? Regularly fix the edges of walkways and driveways and fix any cracks. Remove anything that might make you trip as you walk through a door, such as a raised step or threshold. Trim any bushes or trees on the path to your home. Use bright outdoor lighting. Clear any walking paths of anything that might make someone trip, such as rocks or tools. Regularly check to see if handrails are loose or broken. Make sure that both sides of any steps have handrails. Any raised decks and porches should have guardrails on the edges. Have any leaves, snow, or ice cleared regularly. Use sand or salt on walking paths during winter. Clean up any spills in your garage right away. This includes oil or grease spills. What can I  do in the bathroom? Use night lights. Install grab bars by the toilet and in the tub and shower. Do not use towel bars as grab bars. Use non-skid mats or decals in the tub or shower. If you need to sit down in the shower, use a plastic, non-slip stool. Keep the floor dry. Clean up any water that spills on the floor as soon as it happens. Remove soap buildup in the tub or shower regularly. Attach bath mats securely with double-sided non-slip rug tape. Do not have throw rugs and other things on the floor that can make you trip. What can I do in the bedroom? Use night lights. Make sure that you have a light by your bed that is easy to reach. Do not use any sheets or blankets that are too big for your bed. They should not hang down onto the floor. Have a firm chair that has side arms. You can use this for support while you get dressed. Do not have throw rugs and other things on the floor that can make you trip. What can I do in the kitchen? Clean up any spills right away. Avoid walking on wet floors. Keep items  that you use a lot in easy-to-reach places. If you need to reach something above you, use a strong step stool that has a grab bar. Keep electrical cords out of the way. Do not use floor polish or wax that makes floors slippery. If you must use wax, use non-skid floor wax. Do not have throw rugs and other things on the floor that can make you trip. What can I do with my stairs? Do not leave any items on the stairs. Make sure that there are handrails on both sides of the stairs and use them. Fix handrails that are broken or loose. Make sure that handrails are as long as the stairways. Check any carpeting to make sure that it is firmly attached to the stairs. Fix any carpet that is loose or worn. Avoid having throw rugs at the top or bottom of the stairs. If you do have throw rugs, attach them to the floor with carpet tape. Make sure that you have a light switch at the top of the stairs  and the bottom of the stairs. If you do not have them, ask someone to add them for you. What else can I do to help prevent falls? Wear shoes that: Do not have high heels. Have rubber bottoms. Are comfortable and fit you well. Are closed at the toe. Do not wear sandals. If you use a stepladder: Make sure that it is fully opened. Do not climb a closed stepladder. Make sure that both sides of the stepladder are locked into place. Ask someone to hold it for you, if possible. Clearly mark and make sure that you can see: Any grab bars or handrails. First and last steps. Where the edge of each step is. Use tools that help you move around (mobility aids) if they are needed. These include: Canes. Walkers. Scooters. Crutches. Turn on the lights when you go into a dark area. Replace any light bulbs as soon as they burn out. Set up your furniture so you have a clear path. Avoid moving your furniture around. If any of your floors are uneven, fix them. If there are any pets around you, be aware of where they are. Review your medicines with your doctor. Some medicines can make you feel dizzy. This can increase your chance of falling. Ask your doctor what other things that you can do to help prevent falls. This information is not intended to replace advice given to you by your health care provider. Make sure you discuss any questions you have with your health care provider. Document Released: 04/29/2009 Document Revised: 12/09/2015 Document Reviewed: 08/07/2014 Elsevier Interactive Patient Education  2017 [employer].

## 2022-12-19 NOTE — Progress Notes (Signed)
Subjective:   Samantha Perkins is a 72 y.o. female who presents for Medicare Annual (Subsequent) preventive examination.  Review of Systems    I connected with  Samantha Perkins on 12/19/22 by a audio enabled telemedicine application and verified that I am speaking with the correct person using two identifiers.  Patient Medicare AWV questionnaire was completed by the patient on 12/18/22; I have confirmed that all information answered by patient is correct and no changes since this date.     Patient Location: Home  Provider Location: Home Office  Patient Medicare AWV questionnaire was completed by the patient on 12/18/22; I have confirmed that all information answered by patient is correct and no changes since this date.     I discussed the limitations of evaluation and management by telemedicine. The patient expressed understanding and agreed to proceed.  Cardiac Risk Factors include: advanced age (>20men, >20 women)     Objective:    Today's Vitals   12/18/22 2205 12/19/22 0831  Weight:  194 lb (88 kg)  PainSc: 4     Body mass index is 34.37 kg/m.     12/19/2022    8:41 AM 11/24/2021    2:49 PM  Advanced Directives  Does Patient Have a Medical Advance Directive? Yes No  Type of Estate agent of [place];Living will   Copy of Healthcare Power of Attorney in Chart? No - copy requested     Current Medications (verified) Outpatient Encounter Medications as of 12/19/2022  Medication Sig   aspirin EC 81 MG tablet Take 81 mg by mouth daily. Swallow whole.   atorvastatin (LIPITOR) 20 MG tablet Take 1 tablet by mouth once daily   cetirizine (ZYRTEC) 10 MG tablet Take 10 mg by mouth daily as needed for allergies.   clobetasol (TEMOVATE) 0.05 % GEL Apply topically 2 (two) times daily as needed.   clotrimazole-betamethasone (LOTRISONE) cream Apply topically 2 (two) times daily as needed.   Coenzyme Q10 (COQ-10) 200 MG CAPS Take 1 tablet by mouth daily at 6 (six)  AM.   denosumab (PROLIA) 60 MG/ML SOSY injection Inject 60 mg into the skin every 6 (six) months.   desonide (DESOWEN) 0.05 % cream Apply topically as needed.   diclofenac Sodium (VOLTAREN) 1 % GEL Voltaren Arthritis Pain 1 % topical gel   Docosanol (ABREVA EX) Apply topically 3 (three) times daily as needed.   estradiol (ESTRACE) 0.5 MG tablet 3 times a week (weaning off)   ezetimibe (ZETIA) 10 MG tablet Take 1 tablet by mouth once daily   Lactobacillus-Inulin (CULTURELLE ADULT ULT BALANCE PO) Culturelle   levothyroxine (SYNTHROID) 100 MCG tablet Take 1 tablet (100 mcg total) by mouth daily.   metoprolol succinate (TOPROL-XL) 25 MG 24 hr tablet Take 1 tablet by mouth once daily (Patient taking differently: Take 12.5 mg by mouth daily.)   Multiple Vitamins-Minerals (ONE-A-DAY WOMENS 50+ ADVANTAGE PO) Take by mouth daily.   nitroGLYCERIN (NITROSTAT) 0.4 MG SL tablet Place 1 tablet (0.4 mg total) under the tongue as needed.   No facility-administered encounter medications on file as of 12/19/2022.    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 PCI unable to be performed.    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    Thyroid disease    Past  Surgical History:  Procedure Laterality Date   ABDOMINAL HYSTERECTOMY     APPENDECTOMY     BILATERAL OOPHORECTOMY     CARDIAC CATHETERIZATION     CATARACT EXTRACTION, BILATERAL     GUM SURGERY     KNEE ARTHROSCOPY     LASIK     OVARIAN CYST REMOVAL     ROTATOR CUFF REPAIR Right    ROTATOR CUFF REPAIR W/ DISTAL CLAVICLE EXCISION Left    THYROID SURGERY  12/12/2007   THYROIDECTOMY, PARTIAL     TUBAL LIGATION     Family History  Problem Relation Age of Onset   Heart failure Mother    Hypothyroidism Mother    Aneurysm Mother    Stroke Mother    Mitral  valve prolapse Mother    Lung cancer Mother        smoker   Arthritis Father    Heart disease Father    Heart murmur Father    Diabetes Father    Hypertension Father    COPD Father    Diabetes Sister    Bladder Cancer Sister    Cataracts Sister    Arthritis Sister    Osteopenia Sister    Irritable bowel syndrome Sister    Mitral valve prolapse Sister    Diabetes Sister    Fibroids Sister    Lung cancer Sister        smoker   Mitral valve prolapse Sister    Hypertension Sister    Bradycardia Sister        w/ pacemaker   Crohn's disease Sister    Stroke Sister    Liver cancer Sister    Colon cancer Maternal Uncle        3 uncles    Pancreatic cancer Maternal Uncle        2 uncles with both colon and pancreatic   Diabetes Maternal Grandmother    Breast cancer Neg Hx    Social History   Socioeconomic History   Marital status: Married    Spouse name: Iona Hansen   Number of children: 0   Years of education: master's degree   Highest education level: Not on file  Occupational History   Occupation: retired Runner, broadcasting/film/video  Tobacco Use   Smoking status: Never   Smokeless tobacco: Never  Vaping Use   Vaping Use: Never used  Substance and Sexual Activity   Alcohol use: Yes    Comment: once every two weeks a glass of wine   Drug use: Never   Sexual activity: Not Currently    Birth control/protection: Surgical  Other Topics Concern   Not on file  Social History Narrative   07/26/20   From: [place], moved to be near children   Living: with husband, Iona Hansen (1980)   Work: retired - Production designer, theatre/television/film      Family: step daughter - Industrial/product designer - 2 step granddaughters - in the area      Enjoys: Associate Professor, explore new places, reading, volunteering       Exercise: not currently, joining the [employer]   Diet: tries to eat healthy, not as good as she did before      Safety   Seat belts: Yes    Guns: No   Safe in relationships: Yes    Social  Determinants of Health   Financial Resource Strain: Low Risk  (12/19/2022)   Overall Financial Resource Strain (CARDIA)    Difficulty of Paying Living Expenses: Not hard  at all  Food Insecurity: No Food Insecurity (12/19/2022)   Hunger Vital Sign    Worried About Running Out of Food in the Last Year: Never true    Ran Out of Food in the Last Year: Never true  Transportation Needs: No Transportation Needs (12/19/2022)   PRAPARE - Administrator, Civil Service (Medical): No    Lack of Transportation (Non-Medical): No  Physical Activity: Insufficiently Active (12/19/2022)   Exercise Vital Sign    Days of Exercise per Week: 4 days    Minutes of Exercise per Session: 20 min  Stress: No Stress Concern Present (12/19/2022)   [employer] of Occupational Health - Occupational Stress Questionnaire    Feeling of Stress : Not at all  Social Connections: Socially Integrated (12/19/2022)   Social Connection and Isolation Panel [NHANES]    Frequency of Communication with Friends and Family: More than three times a week    Frequency of Social Gatherings with Friends and Family: More than three times a week    Attends Religious Services: More than 4 times per year    Active Member of [employer] or Organizations: Yes    Attends Engineer, structural: More than 4 times per year    Marital Status: Married    Tobacco Counseling Counseling given: Yes   Clinical Intake:  Pre-visit preparation completed: Yes  Pain : 0-10 Pain Score: 4  Pain Type: Chronic pain Pain Location: Back (and in both hands) Pain Orientation: Right, Left, Lower Pain Descriptors / Indicators: Aching, Sharp Pain Onset: More than a month ago Pain Frequency: Constant     BMI - recorded: 34.37 Nutritional Status: BMI > 30  Obese Nutritional Risks: None Diabetes: No  How often do you need to have someone help you when you read instructions, pamphlets, or other written materials from your doctor or pharmacy?: 1  - Never  Diabetic?no   Interpreter Needed?: No  Information entered by :: Samantha Perkins   Activities of Daily Living    12/18/2022   10:05 PM  In your present state of health, do you have any difficulty performing the following activities:  Hearing? 0  Vision? 0  Difficulty concentrating or making decisions? 1  Walking or climbing stairs? 0  Dressing or bathing? 0  Doing errands, shopping? 0  Preparing Food and eating ? N  Using the Toilet? N  In the past six months, have you accidently leaked urine? Y  Do you have problems with loss of bowel control? N  Managing your Medications? N  Managing your Finances? N  Housekeeping or managing your Housekeeping? N    Patient Care Team: Excell Seltzer, MD as PCP - General (Family Medicine) O'Neal, Ronnald Ramp, MD as Consulting Physician (Cardiology) Janet Berlin, MD as Consulting Physician (Ophthalmology) Dermatology, London Sheer, Noah Delaine, MD as Consulting Physician (Orthopedic Surgery) Bradly Bienenstock, MD as Consulting Physician (Orthopedic Surgery) Juluis Pitch, DDS (Dentistry) Lenny Pastel, DMD (Dentistry)  Indicate any recent Medical Services you may have received from other than [facility] providers in the past year (date may be approximate).     Assessment:   This is a routine wellness examination for Samantha Perkins.  Hearing/Vision screen Hearing Screening - Comments:: Denies hearing difficulties   Vision Screening - Comments:: Wears rx glasses - up to date with routine eye exams with  Dr.Turner  Dietary issues and exercise activities discussed: Current Exercise Habits: Home exercise routine, Type of exercise: walking, Time (Minutes): 15, Frequency (Times/Week): 1, Weekly  Exercise (Minutes/Week): 15, Intensity: Mild, Exercise limited by: orthopedic condition(s)   Goals Addressed             This Visit's Progress    Patient Stated   On track    11/24/2021, wants to be consistent with exercise and lose  weight      Depression Screen    12/19/2022    8:36 AM 11/24/2021    2:53 PM 07/26/2020    4:13 PM  PHQ 2/9 Scores  PHQ - 2 Score 0 0 0    Fall Risk    12/19/2022   11:42 AM 12/18/2022   10:05 PM 11/24/2021    2:53 PM 11/23/2021    3:44 PM 07/26/2020    3:13 PM  Fall Risk   Falls in the past year? 0 0 0 0 0  Number falls in past yr: 0 0 0  0  Injury with Fall? 0 0 0    Risk for fall due to : No Fall Risks  Medication side effect    Follow up Falls prevention discussed;Falls evaluation completed Falls prevention discussed;Falls evaluation completed Falls evaluation completed;Education provided;Falls prevention discussed      FALL RISK PREVENTION PERTAINING TO THE HOME:  Any stairs in or around the home? Yes  If so, are there any without handrails? No  Home free of loose throw rugs in walkways, pet beds, electrical cords, etc? No  Adequate lighting in your home to reduce risk of falls? Yes   ASSISTIVE DEVICES UTILIZED TO PREVENT FALLS:  Life alert? No  Use of a cane, walker or w/c? No  Grab bars in the bathroom? No  Shower chair or bench in shower? Yes  Elevated toilet seat or a handicapped toilet? Yes   TIMED UP AND GO:  Was the test performed? No . Televisit    Cognitive Function:        12/19/2022    8:38 AM 11/24/2021    2:55 PM  6CIT Screen  What Year? 0 points 0 points  What month? 0 points 0 points  What time? 0 points 0 points  Count back from 20 0 points 0 points  Months in reverse 0 points 0 points  Repeat phrase 0 points 2 points  Total Score 0 points 2 points    Immunizations Immunization History  Administered Date(s) Administered   Fluad Quad(high Dose 65+) 05/26/2020, 05/14/2021, 04/19/2022   Moderna Covid-19 Vaccine Bivalent Booster 47yrs & up 05/29/2021   Moderna SARS-COV2 Booster Vaccination 04/28/2022   Moderna Sars-Covid-2 Vaccination 08/05/2019, 09/09/2019, 05/08/2020, 11/05/2020   Pneumococcal Conjugate-13 05/01/2017   Pneumococcal  Polysaccharide-23 07/13/2018   Tdap 02/28/2019   Zoster Recombinat (Shingrix) 05/18/2018, 01/01/2019    TDAP status: Up to date  Flu Vaccine status: Up to date  Pneumococcal vaccine status: Up to date  Covid-19 vaccine status: Completed vaccines  Qualifies for Shingles Vaccine? Yes   Zostavax completed Yes   Shingrix Completed?: Yes  Screening Tests Health Maintenance  Topic Date Due   Hepatitis C Screening  Never done   Colonoscopy  Never done   COVID-19 Vaccine (7 - 2023-24 season) 12/20/2023 (Originally 06/23/2022)   INFLUENZA VACCINE  02/15/2023   Medicare Annual Wellness (AWV)  12/19/2023   MAMMOGRAM  03/01/2024   DTaP/Tdap/Td (2 - Td or Tdap) 02/27/2029   Pneumonia Vaccine 43+ Years old  Completed   DEXA SCAN  Completed   Zoster Vaccines- Shingrix  Completed   HPV VACCINES  Aged Out    Health  Maintenance  Health Maintenance Due  Topic Date Due   Hepatitis C Screening  Never done   Colonoscopy  Never done    Colorectal cancer screening: Type of screening: Colonoscopy. Completed 10/26/14. Repeat every 10 years  Mammogram status: Completed 03/01/22. Repeat every year  Bone Density status: Completed 03/01/22. Results reflect: Bone density results: OSTEOPENIA. Repeat every 5 years.  Lung Cancer Screening: (Low Dose CT Chest recommended if Age 73-80 years, 30 pack-year currently smoking OR have quit w/in 15years.) does not qualify.   Lung Cancer Screening Referral: no  Additional Screening:  Hepatitis C Screening: does qualify; dis  Vision Screening: Recommended annual ophthalmology exams for early detection of glaucoma and other disorders of the eye. Is the patient up to date with their annual eye exam?  Yes  Who is the provider or what is the name of the office in which the patient attends annual eye exams? Dr Burgess Estelle  If pt is not established with a provider, would they like to be referred to a provider to establish care? No .   Dental Screening:  Recommended annual dental exams for proper oral hygiene  Community Resource Referral / Chronic Care Management: CRR required this visit?  No   CCM required this visit?  No      Plan:     I have personally reviewed and noted the following in the patient's chart:   Medical and social history Use of alcohol, tobacco or illicit drugs  Current medications and supplements including opioid prescriptions. Patient is not currently taking opioid prescriptions. Functional ability and status Nutritional status Physical activity Advanced directives List of other physicians Hospitalizations, surgeries, and ER visits in previous 12 months Vitals Screenings to include cognitive, depression, and falls Referrals and appointments  In addition, I have reviewed and discussed with patient certain preventive protocols, quality metrics, and best practice recommendations. A written personalized care plan for preventive services as well as general preventive health recommendations were provided to patient.     Annabell Sabal, CMA   12/19/2022   Nurse Notes: None

## 2022-12-26 ENCOUNTER — Ambulatory Visit (INDEPENDENT_AMBULATORY_CARE_PROVIDER_SITE_OTHER): Payer: Medicare HMO | Admitting: Family Medicine

## 2022-12-26 ENCOUNTER — Other Ambulatory Visit: Payer: Self-pay | Admitting: Cardiovascular Disease

## 2022-12-26 ENCOUNTER — Encounter: Payer: Self-pay | Admitting: Family Medicine

## 2022-12-26 VITALS — BP 100/60 | HR 84 | Temp 98.4°F | Ht 60.75 in | Wt 196.4 lb

## 2022-12-26 DIAGNOSIS — E782 Mixed hyperlipidemia: Secondary | ICD-10-CM | POA: Diagnosis not present

## 2022-12-26 DIAGNOSIS — I2510 Atherosclerotic heart disease of native coronary artery without angina pectoris: Secondary | ICD-10-CM

## 2022-12-26 DIAGNOSIS — M8000XS Age-related osteoporosis with current pathological fracture, unspecified site, sequela: Secondary | ICD-10-CM

## 2022-12-26 DIAGNOSIS — E063 Autoimmune thyroiditis: Secondary | ICD-10-CM | POA: Diagnosis not present

## 2022-12-26 DIAGNOSIS — N816 Rectocele: Secondary | ICD-10-CM | POA: Diagnosis not present

## 2022-12-26 DIAGNOSIS — E038 Other specified hypothyroidism: Secondary | ICD-10-CM | POA: Diagnosis not present

## 2022-12-26 DIAGNOSIS — Z0000 Encounter for general adult medical examination without abnormal findings: Secondary | ICD-10-CM | POA: Diagnosis not present

## 2022-12-26 MED ORDER — VALACYCLOVIR HCL 1 G PO TABS: 2.0000 g | ORAL_TABLET | Freq: Two times a day (BID) | ORAL | 0 refills | Status: DC

## 2022-12-26 NOTE — Assessment & Plan Note (Signed)
Stable, chronic.  Continue current medication.   S/P partial thyroidectomy 2009  Levo 100 mcg daily 

## 2022-12-26 NOTE — Assessment & Plan Note (Addendum)
Encouraged exercise, weight loss, healthy eating habits. weight watchers  and [employer].

## 2022-12-26 NOTE — Progress Notes (Signed)
Patient ID: Samantha Perkins, female    DOB: Feb 02, 1951, 72 y.o.   MRN: [id number]  This visit was conducted in person.  BP 100/60 (BP Location: Left Arm, Patient Position: Sitting, Cuff Size: Large)   Pulse 84   Temp 98.4 F (36.9 C) (Temporal)   Ht 5' 0.75" (1.543 m)   Wt 196 lb 6 oz (89.1 kg)   SpO2 97%   BMI 37.41 kg/m    CC:  Chief Complaint  Patient presents with   Annual Exam    Part 2    Subjective:     HPI: Samantha Perkins is a 72 y.o. female presenting on 12/26/2022 for Annual Exam (Part 2)  The patient presents for complete physical and review of chronic health problems. He/She also has the following acute concerns today: Memory concerns and fatigue x 1.5 weeks  Asking about supplements.. does not want work up.  No regular exercise.. planning to restart tomorrow  TAH years ago.. for fibroids. .  hx of  cervical dysplasia ON HRT: on estradiol.Marland Kitchen trying to wean off... on 3 a week.   CAD.. followed by Dr. Flora Lipps, Cardiology reviewed 09/12/22  PAC, palpitations earlier in year had an episode of heart racing. Took an extra dose of metoprolol and symptoms improved    Hypothyroidism: Followed by ENDO. Lab Results  Component Value Date   TSH 2.65 07/21/2022    Elevated Cholesterol:  CAD LDL at goal < 70.. on atorvastatin 20 mg daily Lab Results  Component Value Date   CHOL 102 12/18/2022   HDL 56.40 12/18/2022   LDLCALC 21 12/18/2022   LDLDIRECT 91 09/06/2020   TRIG 123.0 12/18/2022   CHOLHDL 2 12/18/2022  Using medications without problems: Muscle aches: yes Diet compliance: heart healthy diet Exercise:none recently.. usually at [facility] Other complaints:  Wt Readings from Last 3 Encounters:  12/26/22 196 lb 6 oz (89.1 kg)  12/19/22 194 lb (88 kg)  09/12/22 197 lb (89.4 kg)    Relevant past medical, surgical, family and social history reviewed and updated as indicated. Interim medical history since our last visit reviewed. Allergies and medications  reviewed and updated. Outpatient Medications Prior to Visit  Medication Sig Dispense Refill   aspirin EC 81 MG tablet Take 81 mg by mouth daily. Swallow whole.     atorvastatin (LIPITOR) 20 MG tablet Take 1 tablet by mouth once daily 90 tablet 3   cetirizine (ZYRTEC) 10 MG tablet Take 10 mg by mouth daily as needed for allergies.     clobetasol (TEMOVATE) 0.05 % GEL Apply topically 2 (two) times daily as needed.     clotrimazole-betamethasone (LOTRISONE) cream Apply topically 2 (two) times daily as needed.     Coenzyme Q10 (COQ-10) 200 MG CAPS Take 1 tablet by mouth daily at 6 (six) AM.     denosumab (PROLIA) 60 MG/ML SOSY injection Inject 60 mg into the skin every 6 (six) months. 1 mL 0   desonide (DESOWEN) 0.05 % cream Apply topically as needed.     diclofenac Sodium (VOLTAREN) 1 % GEL Voltaren Arthritis Pain 1 % topical gel     Docosanol (ABREVA EX) Apply topically 3 (three) times daily as needed.     estradiol (ESTRACE) 0.5 MG tablet 3 times a week (weaning off) 45 tablet 0   ezetimibe (ZETIA) 10 MG tablet Take 1 tablet by mouth once daily 90 tablet 3   Lactobacillus-Inulin (CULTURELLE ADULT ULT BALANCE PO) Culturelle     levothyroxine (SYNTHROID)  100 MCG tablet Take 1 tablet (100 mcg total) by mouth daily. 90 tablet 3   melatonin 5 MG TABS Take 5 mg by mouth at bedtime as needed.     metoprolol succinate (TOPROL-XL) 25 MG 24 hr tablet Take 12.5 mg by mouth daily.     Multiple Vitamins-Minerals (ONE-A-DAY WOMENS 50+ ADVANTAGE PO) Take by mouth daily.     nitroGLYCERIN (NITROSTAT) 0.4 MG SL tablet Place 1 tablet (0.4 mg total) under the tongue as needed. 25 tablet 1   metoprolol succinate (TOPROL-XL) 25 MG 24 hr tablet Take 1 tablet by mouth once daily (Patient taking differently: Take 12.5 mg by mouth daily.) 90 tablet 0   No facility-administered medications prior to visit.    History obtained from  Per HPI unless specifically indicated in ROS section below Review of Systems   Constitutional:  Negative for fatigue and fever.  HENT:  Negative for congestion.   Eyes:  Negative for pain.  Respiratory:  Negative for cough and shortness of breath.   Cardiovascular:  Negative for chest pain, palpitations and leg swelling.  Gastrointestinal:  Negative for abdominal pain.  Genitourinary:  Negative for dysuria and vaginal bleeding.  Musculoskeletal:  Negative for back pain.  Neurological:  Negative for syncope, light-headedness and headaches.  Psychiatric/Behavioral:  Negative for dysphoric mood.    Objective:  BP 100/60 (BP Location: Left Arm, Patient Position: Sitting, Cuff Size: Large)   Pulse 84   Temp 98.4 F (36.9 C) (Temporal)   Ht 5' 0.75" (1.543 m)   Wt 196 lb 6 oz (89.1 kg)   SpO2 97%   BMI 37.41 kg/m   Wt Readings from Last 3 Encounters:  12/26/22 196 lb 6 oz (89.1 kg)  12/19/22 194 lb (88 kg)  09/12/22 197 lb (89.4 kg)      Physical Exam Vitals and nursing note reviewed. Exam conducted with a chaperone present.  Constitutional:      General: She is not in acute distress.    Appearance: Normal appearance. She is well-developed. She is not ill-appearing or toxic-appearing.  HENT:     Head: Normocephalic.     Right Ear: Hearing, tympanic membrane, ear canal and external ear normal.     Left Ear: Hearing, tympanic membrane, ear canal and external ear normal.     Nose: Nose normal.  Eyes:     General: Lids are normal. Lids are everted, no foreign bodies appreciated.     Conjunctiva/sclera: Conjunctivae normal.     Pupils: Pupils are equal, round, and reactive to light.  Neck:     Thyroid: No thyroid mass or thyromegaly.     Vascular: No carotid bruit.     Trachea: Trachea normal.  Cardiovascular:     Rate and Rhythm: Normal rate and regular rhythm.     Heart sounds: Normal heart sounds, S1 normal and S2 normal. No murmur heard.    No gallop.  Pulmonary:     Effort: Pulmonary effort is normal. No respiratory distress.     Breath sounds:  Normal breath sounds. No wheezing, rhonchi or rales.  Abdominal:     General: Bowel sounds are normal. There is no distension or abdominal bruit.     Palpations: Abdomen is soft. There is no fluid wave or mass.     Tenderness: There is no abdominal tenderness. There is no guarding or rebound.     Hernia: No hernia is present.  Genitourinary:    Labia:  Right: No rash or tenderness.        Left: No rash or tenderness.      Urethra: No prolapse, urethral pain, urethral swelling or urethral lesion.     Vagina: Normal.     Comments:  Grade 1 rectocele noted Musculoskeletal:     Cervical back: Normal range of motion and neck supple.  Lymphadenopathy:     Cervical: No cervical adenopathy.  Skin:    General: Skin is warm and dry.     Findings: No rash.  Neurological:     Mental Status: She is alert.     Cranial Nerves: No cranial nerve deficit.     Sensory: No sensory deficit.  Psychiatric:        Mood and Affect: Mood is not anxious or depressed.        Speech: Speech normal.        Behavior: Behavior normal. Behavior is cooperative.        Judgment: Judgment normal.       Results for orders placed or performed in visit on 12/18/22  Comprehensive metabolic panel  Result Value Ref Range   Sodium 138 135 - 145 mEq/L   Potassium 4.6 3.5 - 5.1 mEq/L   Chloride 102 96 - 112 mEq/L   CO2 29 19 - 32 mEq/L   Glucose, Bld 104 (H) 70 - 99 mg/dL   BUN 16 6 - 23 mg/dL   Creatinine, Ser 1.61 0.40 - 1.20 mg/dL   Total Bilirubin 0.5 0.2 - 1.2 mg/dL   Alkaline Phosphatase 54 39 - 117 U/L   AST 22 0 - 37 U/L   ALT 23 0 - 35 U/L   Total Protein 6.6 6.0 - 8.3 g/dL   Albumin 4.0 3.5 - 5.2 g/dL   GFR 09.60 >45.40 mL/min   Calcium 8.9 8.4 - 10.5 mg/dL  Hepatitis C antibody  Result Value Ref Range   Hepatitis C Ab NON-REACTIVE NON-REACTIVE  Lipid panel  Result Value Ref Range   Cholesterol 102 0 - 200 mg/dL   Triglycerides 981.1 0.0 - 149.0 mg/dL   HDL 91.47 >82.95 mg/dL   VLDL 62.1  0.0 - 30.8 mg/dL   LDL Cholesterol 21 0 - 99 mg/dL   Total CHOL/HDL Ratio 2    NonHDL 45.98   Hemoglobin A1c  Result Value Ref Range   Hgb A1c MFr Bld 6.1 4.6 - 6.5 %     COVID 19 screen:  No recent travel or known exposure to COVID19 The patient denies respiratory symptoms of COVID 19 at this time. The importance of social distancing was discussed today.   Assessment and Plan The patient's preventative maintenance and recommended screening tests for an annual wellness exam were reviewed in full today. Brought up to date unless services declined.  Counselled on the importance of diet, exercise, and its role in overall health and mortality. The patient's FH and SH was reviewed, including their home life, tobacco status, and drug and alcohol status.   Vaccines: COVID-19 vaccine x 6, flu vaccine April 19, 2022, Tdap 2020, completed pneumonia and Shingrix series Pap/DVE: Status post total hysterectomy Mammo: March 01, 2022 Bone Density: August 2023 on Prolia, followed by Endo T - Colon: last colonoscopy 10[place]/2014 in [place] Smoking Status: Non-smoker ETOH/ drug use: None/none  Hep C: Done  HIV screen:      Problem List Items Addressed This Visit     Age-related osteoporosis with current pathological fracture    Continue Prolia,  q  74-month injection.   Last DEXA: 02/2022      CAD (coronary artery disease)     Per pt 2007.Marland Kitchen attempted stents but unable to place them without increased risk.  Followed by Cardiology Dr. Flora Lipps.      Relevant Medications   metoprolol succinate (TOPROL-XL) 25 MG 24 hr tablet   Hyperlipidemia     CAD LDL at goal < 70.. on atorvastatin 20 mg daily On coQ10 for statin SE.      Relevant Medications   metoprolol succinate (TOPROL-XL) 25 MG 24 hr tablet   Hypothyroidism due to Hashimoto's thyroiditis    Stable, chronic.  Continue current medication.   S/P partial thyroidectomy 2009  Levo 100 mcg daily      Relevant Medications   metoprolol  succinate (TOPROL-XL) 25 MG 24 hr tablet   Rectocele   Severe obesity with body mass index (BMI) of 35.0 to 39.9 with comorbidity (HCC)    Encouraged exercise, weight loss, healthy eating habits. weight watchers  and [employer].      Other Visit Diagnoses     Routine general medical examination at a health care facility    -  Primary       Kerby Nora, MD

## 2022-12-26 NOTE — Assessment & Plan Note (Signed)
CAD LDL at goal < 70.. on atorvastatin 20 mg daily On coQ10 for statin SE.

## 2022-12-26 NOTE — Assessment & Plan Note (Signed)
Continue Prolia,  q 43-month injection.   Last DEXA: 02/2022

## 2022-12-26 NOTE — Assessment & Plan Note (Signed)
Per pt 2007.. attempted stents but unable to place them without increased risk.  Followed by Cardiology Dr. O'Neal. 

## 2023-01-16 ENCOUNTER — Other Ambulatory Visit: Payer: Self-pay | Admitting: Family Medicine

## 2023-01-16 DIAGNOSIS — Z1231 Encounter for screening mammogram for malignant neoplasm of breast: Secondary | ICD-10-CM

## 2023-02-11 ENCOUNTER — Other Ambulatory Visit: Payer: Self-pay | Admitting: Cardiovascular Disease

## 2023-03-05 ENCOUNTER — Ambulatory Visit
Admission: RE | Admit: 2023-03-05 | Discharge: 2023-03-05 | Disposition: A | Discharge status: Home / Self Care | Payer: Medicare HMO | Source: Ambulatory Visit | Attending: Family Medicine | Admitting: Family Medicine

## 2023-03-05 DIAGNOSIS — Z1231 Encounter for screening mammogram for malignant neoplasm of breast: Secondary | ICD-10-CM | POA: Diagnosis not present

## 2023-03-26 ENCOUNTER — Other Ambulatory Visit: Payer: Self-pay | Admitting: Family Medicine

## 2023-03-26 NOTE — Telephone Encounter (Signed)
Last office visit 12/26/2022 for CPE.  Last refilled: Not on current medication list.  Next Appt: No future appointments.

## 2023-04-03 DIAGNOSIS — M75101 Unspecified rotator cuff tear or rupture of right shoulder, not specified as traumatic: Secondary | ICD-10-CM | POA: Diagnosis not present

## 2023-04-03 DIAGNOSIS — M25511 Pain in right shoulder: Secondary | ICD-10-CM | POA: Diagnosis not present

## 2023-04-12 DIAGNOSIS — L821 Other seborrheic keratosis: Secondary | ICD-10-CM | POA: Diagnosis not present

## 2023-04-12 DIAGNOSIS — D2272 Melanocytic nevi of left lower limb, including hip: Secondary | ICD-10-CM | POA: Diagnosis not present

## 2023-04-12 DIAGNOSIS — D225 Melanocytic nevi of trunk: Secondary | ICD-10-CM | POA: Diagnosis not present

## 2023-04-12 DIAGNOSIS — D2261 Melanocytic nevi of right upper limb, including shoulder: Secondary | ICD-10-CM | POA: Diagnosis not present

## 2023-04-12 DIAGNOSIS — D2262 Melanocytic nevi of left upper limb, including shoulder: Secondary | ICD-10-CM | POA: Diagnosis not present

## 2023-04-12 DIAGNOSIS — D2271 Melanocytic nevi of right lower limb, including hip: Secondary | ICD-10-CM | POA: Diagnosis not present

## 2023-04-12 DIAGNOSIS — L239 Allergic contact dermatitis, unspecified cause: Secondary | ICD-10-CM | POA: Diagnosis not present

## 2023-04-12 DIAGNOSIS — L304 Erythema intertrigo: Secondary | ICD-10-CM | POA: Diagnosis not present

## 2023-04-12 DIAGNOSIS — L814 Other melanin hyperpigmentation: Secondary | ICD-10-CM | POA: Diagnosis not present

## 2023-05-04 DIAGNOSIS — M25512 Pain in left shoulder: Secondary | ICD-10-CM | POA: Diagnosis not present

## 2023-05-04 DIAGNOSIS — M25511 Pain in right shoulder: Secondary | ICD-10-CM | POA: Diagnosis not present

## 2023-05-09 ENCOUNTER — Other Ambulatory Visit: Payer: Self-pay | Admitting: Family Medicine

## 2023-05-09 DIAGNOSIS — M8000XS Age-related osteoporosis with current pathological fracture, unspecified site, sequela: Secondary | ICD-10-CM

## 2023-05-16 MED ORDER — DENOSUMAB 60 MG/ML ~~LOC~~ SOSY: 60.0000 mg | PREFILLED_SYRINGE | Freq: Once | SUBCUTANEOUS | Status: AC

## 2023-05-16 NOTE — Addendum Note (Signed)
Addended by: Donnamarie Poag on: 05/16/2023 08:19 PM   Modules accepted: Orders

## 2023-05-28 ENCOUNTER — Ambulatory Visit: Payer: Medicare HMO

## 2023-05-28 ENCOUNTER — Other Ambulatory Visit: Payer: Self-pay

## 2023-05-28 ENCOUNTER — Other Ambulatory Visit (INDEPENDENT_AMBULATORY_CARE_PROVIDER_SITE_OTHER): Payer: Medicare HMO

## 2023-05-28 DIAGNOSIS — M8000XS Age-related osteoporosis with current pathological fracture, unspecified site, sequela: Secondary | ICD-10-CM | POA: Diagnosis not present

## 2023-05-28 LAB — BASIC METABOLIC PANEL
BUN: 15 mg/dL (ref 6–23)
CO2: 32 meq/L (ref 19–32)
Calcium: 9.4 mg/dL (ref 8.4–10.5)
Chloride: 102 meq/L (ref 96–112)
Creatinine, Ser: 0.73 mg/dL (ref 0.40–1.20)
GFR: 82.4 mL/min (ref >60.00)
Glucose, Bld: 95 mg/dL (ref 70–99)
Potassium: 4.4 meq/L (ref 3.5–5.1)
Sodium: 139 meq/L (ref 135–145)

## 2023-05-29 DIAGNOSIS — M75101 Unspecified rotator cuff tear or rupture of right shoulder, not specified as traumatic: Secondary | ICD-10-CM | POA: Diagnosis not present

## 2023-05-30 ENCOUNTER — Ambulatory Visit (INDEPENDENT_AMBULATORY_CARE_PROVIDER_SITE_OTHER): Payer: Medicare HMO

## 2023-05-30 DIAGNOSIS — M8000XS Age-related osteoporosis with current pathological fracture, unspecified site, sequela: Secondary | ICD-10-CM | POA: Diagnosis not present

## 2023-05-30 MED ORDER — DENOSUMAB 60 MG/ML ~~LOC~~ SOSY: 60.0000 mg | PREFILLED_SYRINGE | Freq: Once | SUBCUTANEOUS | Status: AC

## 2023-05-30 MED ORDER — DENOSUMAB 60 MG/ML ~~LOC~~ SOSY
60.0000 mg | PREFILLED_SYRINGE | Freq: Once | SUBCUTANEOUS | Status: AC
  Administered 2023-05-30: 60 mg via SUBCUTANEOUS

## 2023-05-30 NOTE — Progress Notes (Signed)
Per orders of Dr. Kerby Nora, injection of prolia 60 mg given by Lewanda Rife in left arm [place]. Patient tolerated injection well. Patient will make appointment for 6 month.

## 2023-06-07 DIAGNOSIS — M25511 Pain in right shoulder: Secondary | ICD-10-CM | POA: Diagnosis not present

## 2023-06-20 DIAGNOSIS — M75121 Complete rotator cuff tear or rupture of right shoulder, not specified as traumatic: Secondary | ICD-10-CM | POA: Diagnosis not present

## 2023-06-21 ENCOUNTER — Telehealth: Payer: Self-pay | Admitting: Family Medicine

## 2023-06-21 ENCOUNTER — Telehealth: Payer: Self-pay | Admitting: Cardiovascular Disease

## 2023-06-21 NOTE — Telephone Encounter (Signed)
   Pre-operative Risk Assessment  Last visit: 09/12/2022 Next visit: 09/19/2023 Patient Name: Samantha Perkins  DOB: Oct 11, 1950 MRN: [id number]      Request for Surgical Clearance    Procedure:   right reverse total shoulder arthroplasty  Date of Surgery:  Clearance TBD                                 Surgeon:  Dr. Duwayne Heck Surgeon's Group or Practice Name:  [facility] Phone number:  [phone]  Fax number:  [phone]   Type of Clearance Requested:   - Pharmacy:  Hold Aspirin     Type of Anesthesia:   choice   Additional requests/questions:    SignedRoyann Shivers   06/21/2023, 4:17 PM

## 2023-06-21 NOTE — Telephone Encounter (Signed)
   Name: Samantha Perkins  DOB: 1950-11-12  MRN: [id number]  Primary Cardiologist: None   Preoperative team, please contact this patient and set up a phone call appointment for further preoperative risk assessment. Please obtain consent and complete medication review. Thank you for your help.  I confirm that guidance regarding antiplatelet and oral anticoagulation therapy has been completed and, if necessary, noted below.  Patient's aspirin is not prescribed by cardiology.  Recommendations for holding aspirin will need to come from prescribing provider.  I also confirmed the patient resides in the state of [place]. As per [facility] Medical Board telemedicine laws, the patient must reside in the state in which the provider is licensed.   Ronney Asters, NP 06/21/2023, 4:29 PM [facility]

## 2023-06-21 NOTE — Telephone Encounter (Signed)
Patient wanted to let us know that her orthopedic office will be sending a faxed surgical clearance form. Patient says to just let her know if she needs surgical clearance. Will keep an eye out in fax queue. Please let us now if we need to schedule her, thanks.

## 2023-06-22 NOTE — Telephone Encounter (Signed)
[email or web address]

## 2023-06-26 ENCOUNTER — Ambulatory Visit (INDEPENDENT_AMBULATORY_CARE_PROVIDER_SITE_OTHER): Payer: Medicare HMO | Admitting: Family Medicine

## 2023-06-26 ENCOUNTER — Encounter: Payer: Self-pay | Admitting: Family Medicine

## 2023-06-26 VITALS — BP 110/70 | HR 82 | Temp 98.1°F | Ht 60.75 in | Wt 203.1 lb

## 2023-06-26 DIAGNOSIS — Z01818 Encounter for other preprocedural examination: Secondary | ICD-10-CM | POA: Diagnosis not present

## 2023-06-26 NOTE — Assessment & Plan Note (Signed)
Upcoming right total reverse rotator cuff surgery planned for full-thickness rotator cuff tear. Patient will hold aspirin 5 days prior to procedure. Most recent labs including A1c (6.1) optimized.  She is able to do for METS of activity without symptoms. No chronic lung disease. Coronary artery disease history: Pending clearance from Dr. Bufford Buttner cardiology.  Forms faxed to [facility].

## 2023-06-26 NOTE — Progress Notes (Signed)
Patient ID: Samantha Perkins, female    DOB: [birth year], 72 y.o.   MRN: [id number]  This visit was conducted in person.  BP 110/70 (BP Location: Left Arm, Patient Position: Sitting, Cuff Size: Large)   Pulse 82   Temp 98.1 F (36.7 C) (Temporal)   Ht 5' 0.75" (1.543 m)   Wt 203 lb 2 oz (92.1 kg)   SpO2 96%   BMI 38.70 kg/m    CC:  Chief Complaint  Patient presents with   Surgical Clearance    Subjective:     HPI: Samantha Perkins is a 72 y.o. female presenting on 06/26/2023 for Surgical Clearance  She has planned in the hospital surgery planned with Ortho for right rotator cuff surgery. For full thicknes rotator cuff tear.  Will have labs per anesthesia 10-14 days early.  She is on baby aspirin daily for CAD.... will hold 5 days prior to procedure.   No SOB, no CP.  Before shoulder issue she was able to water aerobic 1.5 hrs.. without SOb. Mild SOB going up 2 flights stairs. On recent trip to [place] some increase SOB.    Has thyroid, knee and shoulder surgery before.. some issues with anesthesia waking up after. TAH years ago.. for fibroids. Now off HRT.   CAD.. followed by Dr. Flora Lipps, Cardiology reviewed 09/12/22... She has requested clearance from Dr. Bufford Buttner.  PAC, palpitations earlier in year had an episode of heart racing. Took an extra dose of metoprolol and symptoms improved    Hypothyroidism: Followed by ENDO. Lab Results  Component Value Date   TSH 2.65 07/21/2022    Elevated Cholesterol:  CAD LDL at goal < 70.. on atorvastatin 20 mg daily Lab Results  Component Value Date   CHOL 102 12/18/2022   HDL 56.40 12/18/2022   LDLCALC 21 12/18/2022   LDLDIRECT 91 09/06/2020   TRIG 123.0 12/18/2022   CHOLHDL 2 12/18/2022  Using medications without problems: Muscle aches: yes Diet compliance: heart healthy diet Exercise:none recently.. usually at [facility]. Other complaints:  Wt Readings from Last 3 Encounters:  06/26/23 203 lb 2 oz (92.1 kg)  12/26/22  196 lb 6 oz (89.1 kg)  12/19/22 194 lb (88 kg)    Relevant past medical, surgical, family and social history reviewed and updated as indicated. Interim medical history since our last visit reviewed. Allergies and medications reviewed and updated. Outpatient Medications Prior to Visit  Medication Sig Dispense Refill   aspirin EC 81 MG tablet Take 81 mg by mouth daily. Swallow whole.     atorvastatin (LIPITOR) 20 MG tablet Take 1 tablet by mouth once daily 90 tablet 3   cetirizine (ZYRTEC) 10 MG tablet Take 10 mg by mouth daily as needed for allergies.     clobetasol (TEMOVATE) 0.05 % GEL Apply topically 2 (two) times daily as needed.     clotrimazole-betamethasone (LOTRISONE) cream Apply topically 2 (two) times daily as needed.     Coenzyme Q10 (COQ-10) 200 MG CAPS Take 1 tablet by mouth daily at 6 (six) AM.     desonide (DESOWEN) 0.05 % cream Apply topically as needed.     diclofenac Sodium (VOLTAREN) 1 % GEL Voltaren Arthritis Pain 1 % topical gel     Docosanol (ABREVA EX) Apply topically 3 (three) times daily as needed.     ezetimibe (ZETIA) 10 MG tablet Take 1 tablet by mouth once daily 90 tablet 3   Lactobacillus-Inulin (CULTURELLE ADULT ULT BALANCE PO) Culturelle  levothyroxine (SYNTHROID) 100 MCG tablet Take 1 tablet (100 mcg total) by mouth daily. 90 tablet 3   melatonin 5 MG TABS Take 5 mg by mouth at bedtime as needed.     metoprolol succinate (TOPROL-XL) 25 MG 24 hr tablet Take 1 tablet by mouth once daily 90 tablet 3   Multiple Vitamins-Minerals (ONE-A-DAY WOMENS 50+ ADVANTAGE PO) Take by mouth daily.     nitroGLYCERIN (NITROSTAT) 0.4 MG SL tablet PLACE 1 TABLET UNDER THE TONGUE AS NEEDED 25 tablet 0   PROLIA 60 MG/ML SOSY injection INJECT 60MG  INTO THE SKIN ONCE FOR 1 DOSE 1 mL 0   traMADol (ULTRAM) 50 MG tablet Take 50 mg by mouth every 6 (six) hours as needed.     valACYclovir (VALTREX) 1000 MG tablet TAKE 2 TABLETS BY MOUTH TWICE DAILY FOR  1  DAY 20 tablet 0   estradiol  (ESTRACE) 0.5 MG tablet 3 times a week (weaning off) 45 tablet 0   Facility-Administered Medications Prior to Visit  Medication Dose Route Frequency Provider Last Rate Last Admin   denosumab (PROLIA) injection 60 mg  60 mg Subcutaneous Once Sheral Pfahler E, MD       [START ON 11/27/2023] denosumab (PROLIA) injection 60 mg  60 mg Subcutaneous Once Dezi Brauner E, MD        History obtained from  Per HPI unless specifically indicated in ROS section below Review of Systems  Constitutional:  Negative for fatigue and fever.  HENT:  Negative for congestion.   Eyes:  Negative for pain.  Respiratory:  Negative for cough and shortness of breath.   Cardiovascular:  Negative for chest pain, palpitations and leg swelling.  Gastrointestinal:  Negative for abdominal pain.  Genitourinary:  Negative for dysuria and vaginal bleeding.  Musculoskeletal:  Negative for back pain.  Neurological:  Negative for syncope, light-headedness and headaches.  Psychiatric/Behavioral:  Negative for dysphoric mood.    Objective:  BP 110/70 (BP Location: Left Arm, Patient Position: Sitting, Cuff Size: Large)   Pulse 82   Temp 98.1 F (36.7 C) (Temporal)   Ht 5' 0.75" (1.543 m)   Wt 203 lb 2 oz (92.1 kg)   SpO2 96%   BMI 38.70 kg/m   Wt Readings from Last 3 Encounters:  06/26/23 203 lb 2 oz (92.1 kg)  12/26/22 196 lb 6 oz (89.1 kg)  12/19/22 194 lb (88 kg)      Physical Exam Vitals and nursing note reviewed. Exam conducted with a chaperone present.  Constitutional:      General: She is not in acute distress.    Appearance: Normal appearance. She is well-developed. She is not ill-appearing or toxic-appearing.  HENT:     Head: Normocephalic.     Right Ear: Hearing, tympanic membrane, ear canal and external ear normal.     Left Ear: Hearing, tympanic membrane, ear canal and external ear normal.     Nose: Nose normal.  Eyes:     General: Lids are normal. Lids are everted, no foreign bodies appreciated.      Conjunctiva/sclera: Conjunctivae normal.     Pupils: Pupils are equal, round, and reactive to light.  Neck:     Thyroid: No thyroid mass or thyromegaly.     Vascular: No carotid bruit.     Trachea: Trachea normal.  Cardiovascular:     Rate and Rhythm: Normal rate and regular rhythm.     Heart sounds: Normal heart sounds, S1 normal and S2 normal. No murmur heard.  No gallop.  Pulmonary:     Effort: Pulmonary effort is normal. No respiratory distress.     Breath sounds: Normal breath sounds. No wheezing, rhonchi or rales.  Abdominal:     General: Bowel sounds are normal. There is no distension or abdominal bruit.     Palpations: Abdomen is soft. There is no fluid wave or mass.     Tenderness: There is no abdominal tenderness. There is no guarding or rebound.     Hernia: No hernia is present.  Genitourinary:    Labia:        Right: No rash or tenderness.        Left: No rash or tenderness.      Urethra: No prolapse, urethral pain, urethral swelling or urethral lesion.     Vagina: Normal.     Comments:  Grade 1 rectocele noted Musculoskeletal:     Cervical back: Normal range of motion and neck supple.  Lymphadenopathy:     Cervical: No cervical adenopathy.  Skin:    General: Skin is warm and dry.     Findings: No rash.  Neurological:     Mental Status: She is alert.     Cranial Nerves: No cranial nerve deficit.     Sensory: No sensory deficit.  Psychiatric:        Mood and Affect: Mood is not anxious or depressed.        Speech: Speech normal.        Behavior: Behavior normal. Behavior is cooperative.        Judgment: Judgment normal.       Results for orders placed or performed in visit on 05/28/23  Basic Metabolic Panel  Result Value Ref Range   Sodium 139 135 - 145 mEq/L   Potassium 4.4 3.5 - 5.1 mEq/L   Chloride 102 96 - 112 mEq/L   CO2 32 19 - 32 mEq/L   Glucose, Bld 95 70 - 99 mg/dL   BUN 15 6 - 23 mg/dL   Creatinine, Ser 4.09 0.40 - 1.20 mg/dL   GFR 81.19  >14.78 mL/min   Calcium 9.4 8.4 - 10.5 mg/dL     COVID 19 screen:  No recent travel or known exposure to COVID19 The patient denies respiratory symptoms of COVID 19 at this time. The importance of social distancing was discussed today.   Assessment and Plan  Problem List Items Addressed This Visit     Pre-op evaluation - Primary    Upcoming right total reverse rotator cuff surgery planned for full-thickness rotator cuff tear. Patient will hold aspirin 5 days prior to procedure. Most recent labs including A1c (6.1) optimized.  She is able to do for METS of activity without symptoms. No chronic lung disease. Coronary artery disease history: Pending clearance from Dr. Bufford Buttner cardiology.  Forms faxed to [facility].        Kerby Nora, MD

## 2023-06-26 NOTE — Telephone Encounter (Signed)
Left message on machine for pt to contact the office.   

## 2023-06-28 NOTE — Telephone Encounter (Signed)
2nd attempt to call pt to schedule tele pre op appt.

## 2023-06-29 ENCOUNTER — Telehealth: Payer: Self-pay

## 2023-06-29 NOTE — Telephone Encounter (Signed)
Called and spoke to patient scheduled telephone visit for clearance on 12/24 patient voiced understanding consent and med rec done .... SN     Patient Consent for Virtual Visit        [name] has provided verbal consent on 06/29/2023 for a virtual visit (video or telephone).   CONSENT FOR VIRTUAL VISIT FOR:  Samantha Perkins  By participating in this virtual visit I agree to the following:  I hereby voluntarily request, consent and authorize [facility] and its employed or contracted physicians, physician assistants, nurse practitioners or other licensed health care professionals (the Practitioner), to provide me with telemedicine health care services (the "Services") as deemed necessary by the treating Practitioner. I acknowledge and consent to receive the Services by the Practitioner via telemedicine. I understand that the telemedicine visit will involve communicating with the Practitioner through live audiovisual communication technology and the disclosure of certain medical information by electronic transmission. I acknowledge that I have been given the opportunity to request an in-person assessment or other available alternative prior to the telemedicine visit and am voluntarily participating in the telemedicine visit.  I understand that I have the right to withhold or withdraw my consent to the use of telemedicine in the course of my care at any time, without affecting my right to future care or treatment, and that the Practitioner or I may terminate the telemedicine visit at any time. I understand that I have the right to inspect all information obtained and/or recorded in the course of the telemedicine visit and may receive copies of available information for a reasonable fee.  I understand that some of the potential risks of receiving the Services via telemedicine include:  Delay or interruption in medical evaluation due to technological equipment failure or  disruption; Information transmitted may not be sufficient (e.g. poor resolution of images) to allow for appropriate medical decision making by the Practitioner; and/or  In rare instances, security protocols could fail, causing a breach of personal health information.  Furthermore, I acknowledge that it is my responsibility to provide information about my medical history, conditions and care that is complete and accurate to the best of my ability. I acknowledge that Practitioner's advice, recommendations, and/or decision may be based on factors not within their control, such as incomplete or inaccurate data provided by me or distortions of diagnostic images or specimens that may result from electronic transmissions. I understand that the practice of medicine is not an exact science and that Practitioner makes no warranties or guarantees regarding treatment outcomes. I acknowledge that a copy of this consent can be made available to me via my patient portal [facility] MyChart), or I can request a printed copy by calling the office of [facility].    I understand that my insurance will be billed for this visit.   I have read or had this consent read to me. I understand the contents of this consent, which adequately explains the benefits and risks of the Services being provided via telemedicine.  I have been provided ample opportunity to ask questions regarding this consent and the Services and have had my questions answered to my satisfaction. I give my informed consent for the services to be provided through the use of telemedicine in my medical care

## 2023-06-29 NOTE — Telephone Encounter (Signed)
Patient returned Pre-op call to schedule tele pre-op visit.

## 2023-06-29 NOTE — Telephone Encounter (Signed)
Called and spoke to patient scheduled telephone visit for 12/24 consent and med rec done... SN

## 2023-06-30 DIAGNOSIS — M75121 Complete rotator cuff tear or rupture of right shoulder, not specified as traumatic: Secondary | ICD-10-CM | POA: Diagnosis not present

## 2023-07-10 ENCOUNTER — Encounter: Payer: Self-pay | Admitting: Emergency Medicine

## 2023-07-10 ENCOUNTER — Ambulatory Visit: Payer: Medicare HMO | Attending: Cardiovascular Disease | Admitting: Emergency Medicine

## 2023-07-10 DIAGNOSIS — Z01810 Encounter for preprocedural cardiovascular examination: Secondary | ICD-10-CM | POA: Diagnosis not present

## 2023-07-10 NOTE — Progress Notes (Signed)
Virtual Visit via Telephone Note   Because of Samantha Perkins's co-morbid illnesses, she is at least at moderate risk for complications without adequate follow up.  This format is felt to be most appropriate for this patient at this time.  The patient did not have access to video technology/had technical difficulties with video requiring transitioning to audio format only (telephone).  All issues noted in this document were discussed and addressed.  No physical exam could be performed with this format.  Please refer to the patient's chart for her consent to telehealth for [facility].  Evaluation Performed:  Preoperative cardiovascular risk assessment _____________   Date:  07/10/2023   Patient ID:  Samantha Perkins, DOB 06/26/1951, MRN [id number] Patient Location:  Home Provider location:   Office  Primary Care Provider:  Excell Seltzer, MD Primary Cardiologist:  Reatha Harps, MD  Chief Complaint / Patient Profile   72 y.o. y/o female with a h/o CAD, carotid artery disease, PACs, HLD  who is pending right reverse total shoulder arthroplasty with [facility] and presents today for telephonic preoperative cardiovascular risk assessment.  History of Present Illness    Samantha Perkins is a 72 y.o. female who presents via audio/video conferencing for a telehealth visit today.  Pt was last seen in cardiology clinic on 09/12/2022 by Dr. Flora Lipps.  At that time Samantha Perkins was doing well.  The patient is now pending procedure as outlined above. Since her last visit, she denies chest pain, shortness of breath, lower extremity edema, fatigue, palpitations, melena, hematuria, hemoptysis, diaphoresis, weakness, presyncope, syncope, orthopnea, and PND.  Past Medical History    Past Medical History:  Diagnosis Date   Carotid artery occlusion    Coronary artery disease    Distal LCX MI 2007 in [place]. PCI unable to be performed.    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     Thyroid disease    Past Surgical History:  Procedure Laterality Date   ABDOMINAL HYSTERECTOMY     APPENDECTOMY     BILATERAL OOPHORECTOMY     CARDIAC CATHETERIZATION     CATARACT EXTRACTION, BILATERAL     GUM SURGERY     KNEE ARTHROSCOPY     LASIK     OVARIAN CYST REMOVAL     ROTATOR CUFF REPAIR Right    ROTATOR CUFF REPAIR W/ DISTAL CLAVICLE EXCISION Left    THYROID SURGERY  12/12/2007   THYROIDECTOMY, PARTIAL     TUBAL LIGATION      Allergies  Allergies  Allergen Reactions   Cymbalta [Duloxetine Hcl] Rash   Duloxetine    Latex     Other reaction(s): Blisters   Wound Dressing Adhesive Other (See Comments)    Home Medications    Prior to Admission medications   Medication Sig Start Date End Date Taking? Authorizing Provider  aspirin EC 81 MG tablet Take 81 mg by mouth daily. Swallow whole.    [provider]  atorvastatin (LIPITOR) 20 MG tablet Take 1 tablet by mouth once daily 11/29/22   O'Neal, Ronnald Ramp, MD  cetirizine (ZYRTEC) 10 MG tablet Take 10 mg by mouth daily as needed for allergies.    [provider]  clobetasol (TEMOVATE) 0.05 % GEL Apply topically 2 (two) times daily as needed. 12/01/20   [provider]  clotrimazole-betamethasone (LOTRISONE) cream Apply topically 2 (two) times daily as needed. 04/10/22   [provider]  Coenzyme Q10 (COQ-10) 200 MG CAPS Take  1 tablet by mouth daily at 6 (six) AM.    [provider]  desonide (DESOWEN) 0.05 % cream Apply topically as needed. 02/25/20   [provider]  diclofenac Sodium (VOLTAREN) 1 % GEL Voltaren Arthritis Pain 1 % topical gel    [provider]  Docosanol (ABREVA EX) Apply topically 3 (three) times daily as needed.    [provider]  ezetimibe (ZETIA) 10 MG tablet Take 1 tablet by mouth once daily 11/13/22   O'Neal, Ronnald Ramp, MD  Lactobacillus-Inulin (CULTURELLE ADULT ULT BALANCE PO) Culturelle    [provider]   levothyroxine (SYNTHROID) 100 MCG tablet Take 1 tablet (100 mcg total) by mouth daily. 07/28/22   Shamleffer, Konrad Dolores, MD  melatonin 5 MG TABS Take 5 mg by mouth at bedtime as needed.    [provider]  metoprolol succinate (TOPROL-XL) 25 MG 24 hr tablet Take 1 tablet by mouth once daily 12/26/22   O'Neal, Ronnald Ramp, MD  Multiple Vitamins-Minerals (ONE-A-DAY WOMENS 50+ ADVANTAGE PO) Take by mouth daily.    [provider]  nitroGLYCERIN (NITROSTAT) 0.4 MG SL tablet PLACE 1 TABLET UNDER THE TONGUE AS NEEDED 02/13/23   O'Neal, Ronnald Ramp, MD  PROLIA 60 MG/ML SOSY injection INJECT 60MG  INTO THE SKIN ONCE FOR 1 DOSE 05/11/23   Bedsole, Amy E, MD  traMADol (ULTRAM) 50 MG tablet Take 50 mg by mouth every 6 (six) hours as needed. 06/20/23   [provider]  valACYclovir (VALTREX) 1000 MG tablet TAKE 2 TABLETS BY MOUTH TWICE DAILY FOR  1  DAY 03/26/23   Excell Seltzer, MD    Physical Exam    Vital Signs:  DANAYSHA WAIS does not have vital signs available for review today.  Given telephonic nature of communication, physical exam is limited. AAOx3. NAD. Normal affect.  Speech and respirations are unlabored.  Accessory Clinical Findings    None  Assessment & Plan    1.  Preoperative Cardiovascular Risk Assessment: According to the Revised Cardiac Risk Index (RCRI), her Perioperative Risk of Major Cardiac Event is (%): 0.9. Her Functional Capacity in METs is: 5.62 according to the Duke Activity Status Index (DASI). Therefore, based on ACC/AHA guidelines, patient would be at acceptable risk for the planned procedure without further cardiovascular testing. I will route this recommendation to the requesting party via Epic fax function.  The patient was advised that if she develops new symptoms prior to surgery to contact our office to arrange for a follow-up visit, and she verbalized understanding.  She may hold Aspirin for 5-7 days prior to procedure. Please  resume Aspirin as soon as possible postprocedure, at the discretion of the surgeon.    A copy of this note will be routed to requesting surgeon.  Time:   Today, I have spent 8 minutes with the patient with telehealth technology discussing medical history, symptoms, and management plan.     Denyce Robert, NP  07/10/2023, 9:52 AM

## 2023-07-17 ENCOUNTER — Ambulatory Visit
Admission: EM | Admit: 2023-07-17 | Discharge: 2023-07-17 | Disposition: A | Discharge status: Home / Self Care | Payer: Medicare HMO | Attending: Emergency Medicine | Admitting: Emergency Medicine

## 2023-07-17 ENCOUNTER — Ambulatory Visit: Payer: Self-pay | Admitting: Family Medicine

## 2023-07-17 DIAGNOSIS — N3001 Acute cystitis with hematuria: Secondary | ICD-10-CM | POA: Insufficient documentation

## 2023-07-17 LAB — POCT URINALYSIS DIP (MANUAL ENTRY)
Bilirubin, UA: NEGATIVE
Glucose, UA: NEGATIVE mg/dL
Ketones, POC UA: NEGATIVE mg/dL
Nitrite, UA: POSITIVE — AB
Protein Ur, POC: NEGATIVE mg/dL
Spec Grav, UA: 1.02
Urobilinogen, UA: 0.2 U/dL
pH, UA: 5.5

## 2023-07-17 MED ORDER — CEPHALEXIN 500 MG PO CAPS: 500.0000 mg | ORAL_CAPSULE | Freq: Three times a day (TID) | ORAL | 0 refills | Status: AC

## 2023-07-17 NOTE — Telephone Encounter (Signed)
  Chief Complaint: UTI symptoms Symptoms: Urinary Frequency, Burning with urination and back pain Frequency: Five days Pertinent Negatives: Patient denies fever Disposition: [] ED /[x] Urgent Care (no appt availability in office) / [] Appointment(In office/virtual)/ []  [facility] Virtual [facility]/ [] Home Care/ [] Refused Recommended Disposition /[] [facility]/ []  Follow-up with PCP Additional Notes: patient c/o UTI symptoms.  Patient states that it has been some time since she has had one but remembers her symptoms to be similar to now. Endorses urinary frequency, burning with urination and back pain for five days. Per Protocol, recommendation is for urgent care. Patient verbalizes understanding of plan and will be going to an Urgency to be evaluated. All questions answered.   Copied from CRM [id number]. Topic: Clinical - Red Word Triage >> Jul 17, 2023  2:29 PM Rosina BIRCH wrote: Red Word that prompted transfer to Nurse Triage:  lower back pain , frequency of urination and burning when urinating Reason for Disposition  Urinating more frequently than usual (i.e., frequency)  Answer Assessment - Initial Assessment Questions 1. SYMPTOM: What's the main symptom you're concerned about? (e.g., frequency, incontinence)     Frequency and burning sensation 2. ONSET: When did the  frequency and burning sensation  start?     5 days ago 3. PAIN: Is there any pain? If Yes, ask: How bad is it? (Scale: 1-10; mild, moderate, severe)     5 out of 10 4. CAUSE: What do you think is causing the symptoms?     Thinks she might have a UTI 5. OTHER SYMPTOMS: Do you have any other symptoms? (e.g., blood in urine, fever, flank pain, pain with urination)     Blood in urine about five days ago but not currently. Flank pain, pain with urination  Protocols used: Urinary Symptoms-A-AH

## 2023-07-17 NOTE — ED Triage Notes (Signed)
 Patient to Urgent Care with complaints of dysuria/ lower back pain/ urinary frequency and urgency. Has noticed some blood.   Symptoms started five days ago.

## 2023-07-17 NOTE — Discharge Instructions (Signed)
 Your urinalysis shows Breindel Collier blood cells and nitrates which are indicative of infection, your urine will be sent to the lab to determine exactly which bacteria is present, if any changes need to be made to your medications you will be notified  Begin use of Celexa every 8 hours for 5 days  May continue use of Uristat to help minimize your symptoms until antibiotic removes bacteria, this medication will turn your urine orange  Increase your fluid intake through use of water  As always practice good hygiene, wiping front to back and avoidance of scented vaginal products to prevent further irritation  If symptoms continue to persist after use of medication or recur please follow-up with urgent care or your primary doctor as needed

## 2023-07-17 NOTE — ED Provider Notes (Signed)
 [facility]    CSN: [id number] Arrival date & time: 07/17/23  1559      History   Chief Complaint Chief Complaint  Patient presents with   Dysuria    HPI Samantha Perkins is a 72 y.o. female.   Presents for evaluation of urinary frequency and dysuria present for 5 days.  Had 1 occurrence of hematuria on day 1 which has resolved associated lower back soreness.  Has attempted use of Uristat which has been helpful.  Denies abdominal pain, fever.  Past Medical History:  Diagnosis Date   Carotid artery occlusion    Coronary artery disease    Distal LCX MI 2007 in [place]. PCI unable to be performed.    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    Thyroid  disease     Patient Active Problem List   Diagnosis Date Noted   Pre-op evaluation 06/26/2023   Rectocele 12/26/2022   CAD (coronary artery disease) 06/28/2022   Persistent cough 06/28/2022   History of palpitations 06/28/2022   Hashimoto's disease 04/27/2022   Hormone replacement therapy 03/22/2022   Severe obesity with body mass index (BMI) of 35.0 to 39.9 with comorbidity (HCC) 09/15/2021   Encounter for monitoring postmenopausal estrogen replacement therapy 01/19/2021   History of lobectomy of thyroid  09/17/2020   Age-related osteoporosis with current pathological fracture 09/17/2020   History of MI (myocardial infarction) 07/26/2020   Thyroid  nodule 07/26/2020   Hypothyroidism due to Hashimoto's thyroiditis 07/26/2020   Hyperlipidemia 07/26/2020    Past Surgical History:  Procedure Laterality Date   ABDOMINAL HYSTERECTOMY     APPENDECTOMY     BILATERAL OOPHORECTOMY     CARDIAC CATHETERIZATION     CATARACT EXTRACTION, BILATERAL     GUM SURGERY     KNEE ARTHROSCOPY     LASIK     OVARIAN CYST REMOVAL     ROTATOR CUFF REPAIR Right    ROTATOR CUFF REPAIR W/ DISTAL CLAVICLE EXCISION Left    THYROID  SURGERY  12/12/2007   THYROIDECTOMY, PARTIAL     TUBAL LIGATION      OB History   No obstetric  history on file.      Home Medications    Prior to Admission medications   Medication Sig Start Date End Date Taking? Authorizing Provider  cephALEXin  (KEFLEX ) 500 MG capsule Take 1 capsule (500 mg total) by mouth 3 (three) times daily for 5 days. 07/17/23 07/22/23 Yes Deloyce Walthers, Shelba SAUNDERS, NP  aspirin  EC 81 MG tablet Take 81 mg by mouth daily. Swallow whole.    [provider]  atorvastatin  (LIPITOR) 20 MG tablet Take 1 tablet by mouth once daily 11/29/22   O'Neal, Darryle Ned, MD  cetirizine  (ZYRTEC ) 10 MG tablet Take 10 mg by mouth daily as needed for allergies.    [provider]  clobetasol (TEMOVATE) 0.05 % GEL Apply topically 2 (two) times daily as needed. 12/01/20   [provider]  clotrimazole-betamethasone  (LOTRISONE) cream Apply topically 2 (two) times daily as needed. 04/10/22   [provider]  Coenzyme Q10 (COQ-10) 200 MG CAPS Take 1 tablet by mouth daily at 6 (six) AM.    [provider]  desonide (DESOWEN) 0.05 % cream Apply topically as needed. 02/25/20   [provider]  diclofenac Sodium (VOLTAREN) 1 % GEL Voltaren Arthritis Pain 1 % topical gel    [provider]  Docosanol (ABREVA EX) Apply topically 3 (three) times daily as needed.    [provider]  ezetimibe  (ZETIA ) 10 MG tablet Take 1 tablet by mouth once daily Patient not taking: Reported on 07/17/2023 11/13/22   Barbaraann Darryle Ned, MD  Lactobacillus-Inulin (CULTURELLE ADULT ULT BALANCE PO) Culturelle    [provider]  levothyroxine  (SYNTHROID ) 100 MCG tablet Take 1 tablet (100 mcg total) by mouth daily. 07/28/22   Shamleffer, Ibtehal Jaralla, MD  melatonin 5 MG TABS Take 5 mg by mouth at bedtime as needed.    [provider]  metoprolol  succinate (TOPROL -XL) 25 MG 24 hr tablet Take 1 tablet by mouth once daily 12/26/22   O'Neal, Darryle Ned, MD  Multiple Vitamins-Minerals (ONE-A-DAY WOMENS 50+ ADVANTAGE PO) Take by mouth daily.     [provider]  nitroGLYCERIN  (NITROSTAT ) 0.4 MG SL tablet PLACE 1 TABLET UNDER THE TONGUE AS NEEDED 02/13/23   O'Neal, Darryle Ned, MD  PROLIA  60 MG/ML SOSY injection INJECT 60MG  INTO THE SKIN ONCE FOR 1 DOSE 05/11/23   Bedsole, Amy E, MD  traMADol (ULTRAM) 50 MG tablet Take 50 mg by mouth every 6 (six) hours as needed. 06/20/23   [provider]  valACYclovir  (VALTREX ) 1000 MG tablet TAKE 2 TABLETS BY MOUTH TWICE DAILY FOR  1  DAY 03/26/23   Avelina Greig BRAVO, MD    Family History Family History  Problem Relation Age of Onset   Heart failure Mother    Hypothyroidism Mother    Aneurysm Mother    Stroke Mother    Mitral valve prolapse Mother    Lung cancer Mother        smoker   Arthritis Father    Heart disease Father    Heart murmur Father    Diabetes Father    Hypertension Father    COPD Father    Diabetes Sister    Bladder Cancer Sister    Cataracts Sister    Arthritis Sister    Osteopenia Sister    Irritable bowel syndrome Sister    Mitral valve prolapse Sister    Diabetes Sister    Fibroids Sister    Lung cancer Sister        smoker   Mitral valve prolapse Sister    Hypertension Sister    Bradycardia Sister        w/ pacemaker   Crohn's disease Sister    Stroke Sister    Liver cancer Sister    Colon cancer Maternal Uncle        3 uncles    Pancreatic cancer Maternal Uncle        2 uncles with both colon and pancreatic   Diabetes Maternal Grandmother    Breast cancer Neg Hx     Social History Social History   Tobacco Use   Smoking status: Never   Smokeless tobacco: Never  Vaping Use   Vaping status: Never Used  Substance Use Topics   Alcohol use: Yes    Comment: once every two weeks a glass of wine   Drug use: Never     Allergies   Cymbalta [duloxetine hcl], Duloxetine, Latex, and Wound dressing adhesive   Review of Systems Review of Systems   Physical Exam Triage Vital Signs ED Triage Vitals  Encounter Vitals Group      BP 07/17/23 1801 122/78     Systolic BP Percentile --      Diastolic BP Percentile --      Pulse Rate 07/17/23 1801 76     Resp 07/17/23 1801 16     Temp  07/17/23 1801 97.7 F (36.5 C)     Temp src --      SpO2 07/17/23 1801 97 %     Weight --      Height --      Head Circumference --      Peak Flow --      Pain Score 07/17/23 1754 5     Pain Loc --      Pain Education --      Exclude from Growth Chart --    No data found.  Updated Vital Signs BP 122/78   Pulse 76   Temp 97.7 F (36.5 C)   Resp 16   SpO2 97%   Visual Acuity Right Eye Distance:   Left Eye Distance:   Bilateral Distance:    Right Eye Near:   Left Eye Near:    Bilateral Near:     Physical Exam Constitutional:      Appearance: Normal appearance.  Eyes:     Extraocular Movements: Extraocular movements intact.  Pulmonary:     Effort: Pulmonary effort is normal.  Abdominal:     Tenderness: There is no right CVA tenderness or left CVA tenderness.  Genitourinary:    Comments: deferred Neurological:     Mental Status: She is alert and oriented to person, place, and time. Mental status is at baseline.      UC Treatments / Results  Labs (all labs ordered are listed, but only abnormal results are displayed) Labs Reviewed  POCT URINALYSIS DIP (MANUAL ENTRY) - Abnormal; Notable for the following components:      Result Value   Blood, UA small (*)    Nitrite, UA Positive (*)    Leukocytes, UA Large (3+) (*)    All other components within normal limits  URINE CULTURE    EKG   Radiology No results found.  Procedures Procedures (including critical care time)  Medications Ordered in UC Medications - No data to display  Initial Impression / Assessment and Plan / UC Course  I have reviewed the triage vital signs and the nursing notes.  Pertinent labs & imaging results that were available during my care of the patient were reviewed by me and considered in my medical decision making (see  chart for details).  Acute cystitis with hematuria  Urinalysis shows Georgia Delsignore blood cells and nitrates, sent for culture, discussed findings with patient, treated with cephalexin , recommended continued use of Uristat as needed recommended supportive measures and advised follow-up if symptoms persist worsen or recur  Final Clinical Impressions(s) / UC Diagnoses   Final diagnoses:  Acute cystitis with hematuria     Discharge Instructions      Your urinalysis shows Khaleah Duer blood cells and nitrates which are indicative of infection, your urine will be sent to the lab to determine exactly which bacteria is present, if any changes need to be made to your medications you will be notified  Begin use of Celexa every 8 hours for 5 days  May continue use of Uristat to help minimize your symptoms until antibiotic removes bacteria, this medication will turn your urine orange  Increase your fluid intake through use of water  As always practice good hygiene, wiping front to back and avoidance of scented vaginal products to prevent further irritation  If symptoms continue to persist after use of medication or recur please follow-up with urgent care or your primary doctor as needed    ED Prescriptions     Medication Sig Dispense Auth.  Provider   cephALEXin  (KEFLEX ) 500 MG capsule Take 1 capsule (500 mg total) by mouth 3 (three) times daily for 5 days. 15 capsule Tadan Shill R, NP      PDMP not reviewed this encounter.   Teresa Shelba SAUNDERS, [place] 07/17/23 [phone]

## 2023-07-19 NOTE — Telephone Encounter (Signed)
 Noted and agree with urgent Care.

## 2023-07-20 LAB — URINE CULTURE: Culture: >=100000 — AB

## 2023-07-30 ENCOUNTER — Telehealth: Payer: Self-pay

## 2023-07-30 ENCOUNTER — Other Ambulatory Visit (HOSPITAL_COMMUNITY): Payer: Self-pay

## 2023-07-30 NOTE — Telephone Encounter (Signed)
 Prolia VOB initiated via [email or web address]

## 2023-08-01 ENCOUNTER — Ambulatory Visit: Payer: Medicare HMO | Admitting: Internal Medicine

## 2023-08-01 ENCOUNTER — Encounter: Payer: Self-pay | Admitting: Internal Medicine

## 2023-08-01 VITALS — BP 120/74 | HR 71 | Ht 60.75 in | Wt 198.0 lb

## 2023-08-01 DIAGNOSIS — E890 Postprocedural hypothyroidism: Secondary | ICD-10-CM

## 2023-08-01 NOTE — Progress Notes (Addendum)
COVID Vaccine received:  []  No [x]  Yes Date of any COVID positive Test in last 90 days:  none PCP - Kerby Nora, MD clearance scanned to Media  Cardiologist - Lennie Odor, MD,  Rise Paganini, NP  cardiac clearance in 07-10-23 Epic note Endocrinology- Terrace Arabia, MD  Chest x-ray -  EKG - 09-12-2022  Stress Test - 04-14-2020  Epic  ECHO -  Cardiac Cath - 2007  LHC- no PCI done in [facility]  PCR screen: [x]  Ordered & Completed []   No Order but Needs PROFEND     []   N/A for this surgery  Surgery Plan:  []  Ambulatory   [x]  Outpatient in bed  []  Admit Anesthesia:    []  General  []  Spinal  [x]   Choice []   MAC  Pacemaker / ICD device [x]  No []  Yes   Spinal Cord Stimulator:[x]  No []  Yes       History of Sleep Apnea? [x]  No []  Yes   CPAP used?- [x]  No []  Yes    Does the patient monitor blood sugar?   []  N/A   [x]  No []  Yes  Patient has: []  NO Hx DM   [x]  Pre-DM   []  DM1  []   DM2 Last A1c was: 6.1   on   12-18-2022    Blood Thinner / Instructions:  none Aspirin Instructions:  ASA 81 mg  hold 5-7 days  ERAS Protocol Ordered: []  No  [x]  Yes PRE-SURGERY []  ENSURE  [x]  G2   Patient is to be NPO after: 06:45  Dental hx: []  Dentures:  []  N/A      []  Bridge or Partial:                   []  Loose or Damaged teeth:   Comments: The patient was given Benzoyl peroxide Gel as ordered. Instruction regarding application starting 2 days prior to surgery was given and patient voiced understanding.   Patient was given the 5 CHG shower / bath instructions for Reverse Shoulder arthroplasty surgery along with 2 bottles of the CHG soap. Patient will start this on:  Monday 08-06-2023     All questions were asked and answered, Patient voiced understanding of this process.   Activity level: Patient is able to climb a flight of stairs without difficulty; [x]  No CP  [x]  No SOB.   Patient can perform ADLs without assistance.   Anesthesia review: Hx MI 2007- CAD-had cath no PCI (done in [facility]),  HTN, Pre-DM, Hashimoto's thyroiditis  Patient denies shortness of breath, fever, cough and chest pain at PAT appointment.  Patient verbalized understanding and agreement to the Pre-Surgical Instructions that were given to them at this PAT appointment. Patient was also educated of the need to review these PAT instructions again prior to her surgery.I reviewed the appropriate phone numbers to call if they have any and questions or concerns.

## 2023-08-01 NOTE — Patient Instructions (Signed)

## 2023-08-01 NOTE — Progress Notes (Signed)
Name: Samantha Perkins  MRN/ DOB: [id number], Jun 29, 1951    Age/ Sex: 73 y.o., female     PCP: Excell Seltzer, MD   Reason for Endocrinology Evaluation: Hashimoto's Thyroiditis and MNG     Initial Endocrinology Clinic Visit: 09/17/2020    PATIENT IDENTIFIER: [name] is a 73 y.o., female with a past medical history of Hashimoto's thyroiditis, Osteopenia, Dyslipidemia and CAD . She has followed with [facility] since 09/17/2020 for consultative assistance with management of her Hashimoto's Thyroiditis and MNG  HISTORICAL SUMMARY:  Moved from [place]   She was diagnosed with hypothyroidism in 2008 S/P left Lobectomy in 2009 for benign thyroid  Nodule nodule , was started on LT-4 replacement in 2009 A few years later Liothyronine was added due to sleep issues     Last thyroid ultrasound 03/2020, no new nodules per pt .   Has hx of several stress fractures with osteopenia for years. She has been on Actonel and Boniva , as of recent has been on Prolia ~ 3 yrs now She is on calcium 600 mg and Vitamin D through [facility]   Of note the pt had  MI in 2007, cardiac cath was unsucceful - follows with Dr. Scharlene Gloss    We stopped Liothyronine 01/2021 per pt request    SUBJECTIVE:     Today (08/01/2023):  Samantha Perkins is here for a follow up on postoperative hypothyroidism   Patient is scheduled for reverse total shoulder surgery 08/10/2023  Weight has been stable Denies local neck swelling  Denies recent constipation or diarrhea  Had two episodes of palpitations , took metoprolol. Follows with cardiology  Denies tremors  No biotin intake   Levothyroxine 100 mcg ,daily    HISTORY:  Past Medical History:  Past Medical History:  Diagnosis Date   Carotid artery occlusion    Coronary artery disease    Distal LCX MI 2007 in [facility] [facility]. PCI unable to be performed.    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 She has never used smokeless tobacco. She reports current alcohol use. She reports that she does not use drugs. Family History:  Family History  Problem Relation Age of Onset   Heart failure Mother    Hypothyroidism Mother    Aneurysm Mother    Stroke Mother    Mitral valve prolapse Mother    Lung cancer Mother        smoker   Arthritis Father    Heart disease Father    Heart murmur Father    Diabetes Father    Hypertension Father    COPD Father    Diabetes Sister    Bladder Cancer Sister    Cataracts Sister    Arthritis Sister    Osteopenia Sister  Irritable bowel syndrome Sister    Mitral valve prolapse Sister    Diabetes Sister    Fibroids Sister    Lung cancer Sister        smoker   Mitral valve prolapse Sister    Hypertension Sister    Bradycardia Sister        w/ pacemaker   Crohn's disease Sister    Stroke Sister    Liver cancer Sister    Colon cancer Maternal Uncle        3 uncles    Pancreatic cancer Maternal Uncle        2 uncles with both colon and pancreatic   Diabetes Maternal Grandmother    Breast cancer Neg Hx      HOME MEDICATIONS: Allergies as of 08/01/2023       Reactions   Cymbalta [duloxetine Hcl] Rash   Duloxetine    Latex    Other reaction(s): Blisters   Wound Dressing Adhesive Other (See Comments)   Other Itching, Rash   Metal        Medication List        Accurate as of August 01, 2023 11:16 AM. If you have any questions, ask your nurse or doctor.           ABREVA EX Apply 1 Application topically 3 (three) times daily as needed (Fever blister).   aspirin EC 81 MG tablet Take 81 mg by mouth daily. Swallow whole.   atorvastatin 20 MG tablet Commonly known as: LIPITOR Take 1 tablet by mouth once daily   CALCIUM 600 + D PO Take 1 tablet by mouth daily.   cetirizine 10 MG tablet Commonly known as: ZYRTEC Take 10 mg by mouth daily as needed for allergies.   clobetasol 0.05 % Gel Commonly known as: TEMOVATE Apply 1 application  topically 2 (two) times daily as needed (Unknown).   CoQ-10 200 MG Caps Take 200 mg by mouth daily at 6 (six) AM.   CULTURELLE ADULT ULT BALANCE PO Take 1 tablet by mouth daily.   desonide 0.05 % cream Commonly known as: DESOWEN Apply 1 Application topically daily as needed (Skin).   diclofenac Sodium 1 % Gel Commonly known as: VOLTAREN Apply 2 g topically daily as needed (pain).   ezetimibe 10 MG tablet Commonly known as: ZETIA Take 1 tablet by mouth once daily   L-FORMULA LYSINE HCL PO Take 500 mg by mouth daily.   levothyroxine 100 MCG tablet Commonly known as: SYNTHROID Take 1 tablet (100 mcg total) by mouth daily.   melatonin 5 MG Tabs Take 5 mg by mouth at bedtime as needed (Sleep).   metoprolol succinate 25 MG 24 hr tablet Commonly known as: TOPROL-XL Take 1 tablet by mouth once daily What changed: how much to take   nitroGLYCERIN 0.4 MG SL tablet Commonly known as: NITROSTAT PLACE 1 TABLET UNDER THE TONGUE AS NEEDED   ONE-A-DAY WOMENS 50+ ADVANTAGE PO Take by mouth daily.   Prolia 60 MG/ML Sosy injection Generic drug: denosumab INJECT 60MG  INTO THE SKIN ONCE FOR 1 DOSE   REFRESH EYE ITCH RELIEF OP Place 1 drop into both eyes daily as needed (Dry eyes).   sodium chloride 0.65 % Soln nasal spray Commonly known as: OCEAN Place 1 spray into both nostrils daily as needed for congestion.   traMADol 50 MG tablet Commonly known as: ULTRAM Take 50 mg by mouth every 6  (six) hours as needed.   valACYclovir 1000 MG tablet Commonly known  as: VALTREX TAKE 2 TABLETS BY MOUTH TWICE DAILY FOR  1  DAY What changed: See the new instructions.   Vitafusion Fiber Well 2.5 g Chew Chew 1 tablet by mouth daily.          OBJECTIVE:   PHYSICAL EXAM: VS: BP 120/74 (BP Location: Left Arm, Patient Position: Sitting, Cuff Size: Normal)   Pulse 71   Ht 5' 0.75" (1.543 m)   Wt 198 lb (89.8 kg)   SpO2 96%   BMI 37.72 kg/m    EXAM: General: Pt appears well and is in NAD  Neck: General: Supple without adenopathy. Thyroid:  No goiter or nodules appreciated.  Lungs: Clear with good BS bilat   Heart: Auscultation: RRR.  Extremities:  BL LE: No pretibial edema   Mental Status:  Mood and affect: No depression, anxiety, or agitation     DATA REVIEWED:   Latest Reference Range & Units 08/01/23 11:53  TSH 0.40 - 4.50 mIU/L 0.67  T4,Free(Direct) 0.8 - 1.8 ng/dL 1.5    ASSESSMENT / PLAN / RECOMMENDATIONS:   Hashimoto's Thyroiditis:    -Patient is clinically euthyroid -No local neck symptoms -TFTs normal, no change  Medications :  Continue levothyroxine 100 MCG daily   2. S/P left Lobectomy :   - S/P left lobectomy in 2009 secondary to Benign reasons - Per pt she has been getting annual ultrasounds, these records not available. - Repeat Ultrasound showed heterogenous gland without cysts or nodules  -Reassurance provided  -We will continue to monitor clinically and have low threshold for repeat ultrasound        F/U in 1 yr    Signed electronically by: Lyndle Herrlich, MD  [facility] Endocr[facility]  [facility] [address] [place] [place], [place] [address] Phone: [phone] FAX: [phone]      CC: Excell Seltzer, MD [address] [place] [address] Phone: [phone]  Fax: [phone]   Return to Endocrinology clinic as below: Future Appointments  Date Time Provider Department  Center  08/02/2023  2:00 PM [facility] PAT 2 [facility] None  09/19/2023  1:20 PM O'Neal, Ronnald Ramp, MD [facility] None

## 2023-08-02 ENCOUNTER — Other Ambulatory Visit: Payer: Self-pay

## 2023-08-02 ENCOUNTER — Other Ambulatory Visit (HOSPITAL_COMMUNITY): Payer: Self-pay

## 2023-08-02 ENCOUNTER — Encounter (HOSPITAL_COMMUNITY)
Admission: RE | Admit: 2023-08-02 | Discharge: 2023-08-02 | Disposition: A | Discharge status: Home / Self Care | Payer: Medicare HMO | Source: Ambulatory Visit | Attending: Orthopedic Surgery | Admitting: Orthopedic Surgery

## 2023-08-02 ENCOUNTER — Encounter (HOSPITAL_COMMUNITY): Payer: Self-pay

## 2023-08-02 VITALS — BP 116/62 | HR 72 | Temp 98.8°F | Resp 14 | Ht 60.75 in | Wt 196.0 lb

## 2023-08-02 DIAGNOSIS — I10 Essential (primary) hypertension: Secondary | ICD-10-CM | POA: Insufficient documentation

## 2023-08-02 DIAGNOSIS — E063 Autoimmune thyroiditis: Secondary | ICD-10-CM | POA: Diagnosis not present

## 2023-08-02 DIAGNOSIS — Z01818 Encounter for other preprocedural examination: Secondary | ICD-10-CM

## 2023-08-02 DIAGNOSIS — Z01812 Encounter for preprocedural laboratory examination: Secondary | ICD-10-CM | POA: Diagnosis not present

## 2023-08-02 DIAGNOSIS — M19011 Primary osteoarthritis, right shoulder: Secondary | ICD-10-CM | POA: Insufficient documentation

## 2023-08-02 DIAGNOSIS — I2510 Atherosclerotic heart disease of native coronary artery without angina pectoris: Secondary | ICD-10-CM | POA: Insufficient documentation

## 2023-08-02 HISTORY — DX: Prediabetes: R73.03

## 2023-08-02 HISTORY — DX: Hypothyroidism, unspecified: E03.9

## 2023-08-02 HISTORY — DX: Other complications of anesthesia, initial encounter: T88.59XA

## 2023-08-02 HISTORY — DX: Unspecified osteoarthritis, unspecified site: M19.90

## 2023-08-02 HISTORY — DX: Acute myocardial infarction, unspecified: I21.9

## 2023-08-02 HISTORY — DX: Essential (primary) hypertension: I10

## 2023-08-02 LAB — BASIC METABOLIC PANEL
Anion gap: 7 (ref 5–15)
BUN: 13 mg/dL (ref 8–23)
CO2: 25 mmol/L (ref 22–32)
Calcium: 8.9 mg/dL (ref 8.9–10.3)
Chloride: 106 mmol/L (ref 98–111)
Creatinine, Ser: 0.56 mg/dL (ref 0.44–1.00)
GFR, Estimated: >60 mL/min (ref >60)
Glucose, Bld: 108 mg/dL — ABNORMAL HIGH (ref 70–99)
Potassium: 4.7 mmol/L (ref 3.5–5.1)
Sodium: 138 mmol/L (ref 135–145)

## 2023-08-02 LAB — CBC
HCT: 43.4 % (ref 36.0–46.0)
Hemoglobin: 13.4 g/dL (ref 12.0–15.0)
MCH: 26.3 pg (ref 26.0–34.0)
MCHC: 30.9 g/dL (ref 30.0–36.0)
MCV: 85.3 fL (ref 80.0–100.0)
Platelets: 265 10*3/uL (ref 150–400)
RBC: 5.09 MIL/uL (ref 3.87–5.11)
RDW: 13.6 % (ref 11.5–15.5)
WBC: 6.9 10*3/uL (ref 4.0–10.5)
nRBC: 0 % (ref 0.0–0.2)

## 2023-08-02 LAB — SURGICAL PCR SCREEN
MRSA, PCR: NEGATIVE
Staphylococcus aureus: NEGATIVE

## 2023-08-02 LAB — T4, FREE: Free T4: 1.5 ng/dL (ref 0.8–1.8)

## 2023-08-02 LAB — TSH: TSH: 0.67 m[IU]/L (ref 0.40–4.50)

## 2023-08-02 MED ORDER — LEVOTHYROXINE SODIUM 100 MCG PO TABS: 100.0000 ug | ORAL_TABLET | Freq: Every day | ORAL | 3 refills | Status: DC

## 2023-08-03 NOTE — Progress Notes (Signed)
Anesthesia Chart Review   Case: [id number] Date/Time: 08/10/23 0930   Procedure: REVERSE SHOULDER ARTHROPLASTY (Right: Shoulder)   Anesthesia type: Choice   Pre-op diagnosis: Right shoulder rotator cuff arthropathy   Location: [facility] ROOM 08 / [facility]   Surgeons: Yolonda Kida, MD       DISCUSSION:73 y.o. never smoker with h/o HTN, hypothyroidism, CAD,s/p PCI 2007, right shoulder OA scheduled for above procedure 08/10/23 with Dr. Yolonda Kida.   Pt seen by cardiology 07/10/2023. Per OV note, "According to the Revised Cardiac Risk Index (RCRI), her Perioperative Risk of Major Cardiac Event is (%): 0.9. Her Functional Capacity in METs is: 5.62 according to the Duke Activity Status Index (DASI). Therefore, based on ACC/AHA guidelines, patient would be at acceptable risk for the planned procedure without further cardiovascular testing. I will route this recommendation to the requesting party via Epic fax function.   The patient was advised that if she develops new symptoms prior to surgery to contact our office to arrange for a follow-up visit, and she verbalized understanding.   She may hold Aspirin for 5-7 days prior to procedure. Please resume Aspirin as soon as possible postprocedure, at the discretion of the surgeon." VS: BP 116/62 Comment: left arm sitting  Pulse 72   Temp 37.1 C (Oral)   Resp 14   Ht 5' 0.75" (1.543 m)   Wt 88.9 kg   SpO2 98%   BMI 37.34 kg/m   PROVIDERS: Excell Seltzer, MD is PCP    LABS: Labs reviewed: Acceptable for surgery. (all labs ordered are listed, but only abnormal results are displayed)  Labs Reviewed  BASIC METABOLIC PANEL - Abnormal; Notable for the following components:      Result Value   Glucose, Bld 108 (*)    All other components within normal limits  SURGICAL PCR SCREEN  CBC     IMAGES:   EKG:   CV:  Past Medical History:  Diagnosis Date   Arthritis    Carotid artery occlusion    Complication of anesthesia     trouble waking up and her O2 goes low   Coronary artery disease    Distal LCX MI 2007 in [facility]. PCI unable to be performed.    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    Hypertension    Hypothyroidism    Hashimoto's Thyroiditis   Myocardial infarction (HCC)    Pre-diabetes    Thyroid disease     Past Surgical History:  Procedure Laterality Date   ABDOMINAL HYSTERECTOMY  1991   APPENDECTOMY  1977   BILATERAL OOPHORECTOMY  1991   CARDIAC CATHETERIZATION     CATARACT EXTRACTION, BILATERAL     GUM SURGERY     KNEE ARTHROSCOPY Right 11/08/2017   LASIK Bilateral    OVARIAN CYST REMOVAL     ROTATOR CUFF REPAIR Right 1982   ROTATOR CUFF REPAIR W/ DISTAL CLAVICLE EXCISION Left 07/05/2018   THYROID SURGERY  12/12/2007   TUBAL LIGATION     WRIST SURGERY Left     MEDICATIONS:  aspirin EC 81 MG tablet   atorvastatin (LIPITOR) 20 MG tablet   Calcium Carb-Cholecalciferol (CALCIUM 600 + D PO)   cetirizine (ZYRTEC) 10 MG tablet   clobetasol (TEMOVATE) 0.05 % GEL   Coenzyme Q10 (COQ-10) 200 MG CAPS   desonide (DESOWEN) 0.05 % cream   diclofenac Sodium (VOLTAREN) 1 % GEL   Docosanol (ABREVA EX)   ezetimibe (ZETIA) 10 MG tablet   Ketotifen  Fumarate (REFRESH EYE ITCH RELIEF OP)   L-FORMULA LYSINE HCL PO   Lactobacillus-Inulin (CULTURELLE ADULT ULT BALANCE PO)   levothyroxine (SYNTHROID) 100 MCG tablet   melatonin 5 MG TABS   metoprolol succinate (TOPROL-XL) 25 MG 24 hr tablet   Multiple Vitamins-Minerals (ONE-A-DAY WOMENS 50+ ADVANTAGE PO)   nitroGLYCERIN (NITROSTAT) 0.4 MG SL tablet   PROLIA 60 MG/ML SOSY injection   sodium chloride (OCEAN) 0.65 % SOLN nasal spray   traMADol (ULTRAM) 50 MG tablet   valACYclovir (VALTREX) 1000 MG tablet   Vitafusion Fiber Well 2.5 g CHEW    denosumab (PROLIA) injection 60 mg   [START ON 11/27/2023] denosumab (PROLIA) injection 60 mg    [facility] Ward, PA-C [facility] Pre-Surgical Testing [phone]

## 2023-08-10 ENCOUNTER — Observation Stay (HOSPITAL_COMMUNITY): Payer: Medicare HMO

## 2023-08-10 ENCOUNTER — Ambulatory Visit (HOSPITAL_COMMUNITY): Payer: Medicare HMO | Admitting: Physician Assistant

## 2023-08-10 ENCOUNTER — Encounter (HOSPITAL_COMMUNITY): Payer: Self-pay | Admitting: Orthopedic Surgery

## 2023-08-10 ENCOUNTER — Observation Stay (HOSPITAL_COMMUNITY)
Admission: RE | Admit: 2023-08-10 | Discharge: 2023-08-11 | Disposition: A | Discharge status: Home / Self Care | Payer: Medicare HMO | Source: Ambulatory Visit | Attending: Orthopedic Surgery | Admitting: Orthopedic Surgery

## 2023-08-10 ENCOUNTER — Other Ambulatory Visit: Payer: Self-pay

## 2023-08-10 ENCOUNTER — Encounter (HOSPITAL_COMMUNITY)
Admission: RE | Disposition: A | Discharge status: Home / Self Care | Payer: Self-pay | Source: Ambulatory Visit | Attending: Orthopedic Surgery

## 2023-08-10 ENCOUNTER — Ambulatory Visit (HOSPITAL_COMMUNITY): Payer: Medicare HMO | Admitting: Anesthesiology

## 2023-08-10 DIAGNOSIS — Z91040 Latex allergy status: Secondary | ICD-10-CM | POA: Diagnosis not present

## 2023-08-10 DIAGNOSIS — Z79899 Other long term (current) drug therapy: Secondary | ICD-10-CM | POA: Insufficient documentation

## 2023-08-10 DIAGNOSIS — M75101 Unspecified rotator cuff tear or rupture of right shoulder, not specified as traumatic: Principal | ICD-10-CM | POA: Insufficient documentation

## 2023-08-10 DIAGNOSIS — Z96611 Presence of right artificial shoulder joint: Principal | ICD-10-CM

## 2023-08-10 DIAGNOSIS — Z7982 Long term (current) use of aspirin: Secondary | ICD-10-CM | POA: Insufficient documentation

## 2023-08-10 DIAGNOSIS — E039 Hypothyroidism, unspecified: Secondary | ICD-10-CM | POA: Insufficient documentation

## 2023-08-10 DIAGNOSIS — I2510 Atherosclerotic heart disease of native coronary artery without angina pectoris: Secondary | ICD-10-CM

## 2023-08-10 DIAGNOSIS — G8918 Other acute postprocedural pain: Secondary | ICD-10-CM | POA: Diagnosis not present

## 2023-08-10 DIAGNOSIS — E785 Hyperlipidemia, unspecified: Secondary | ICD-10-CM

## 2023-08-10 DIAGNOSIS — M12811 Other specific arthropathies, not elsewhere classified, right shoulder: Secondary | ICD-10-CM

## 2023-08-10 DIAGNOSIS — M19011 Primary osteoarthritis, right shoulder: Secondary | ICD-10-CM | POA: Diagnosis not present

## 2023-08-10 DIAGNOSIS — I10 Essential (primary) hypertension: Secondary | ICD-10-CM | POA: Diagnosis not present

## 2023-08-10 DIAGNOSIS — Z471 Aftercare following joint replacement surgery: Secondary | ICD-10-CM | POA: Diagnosis not present

## 2023-08-10 HISTORY — PX: REVERSE SHOULDER ARTHROPLASTY: SHX5054

## 2023-08-10 SURGERY — ARTHROPLASTY, SHOULDER, TOTAL, REVERSE
Anesthesia: General | Site: Shoulder | Laterality: Right

## 2023-08-10 MED ORDER — FENTANYL CITRATE PF 50 MCG/ML IJ SOSY: 25.0000 ug | PREFILLED_SYRINGE | Freq: Once | INTRAMUSCULAR | Status: AC

## 2023-08-10 MED ORDER — ASPIRIN 81 MG PO TBEC
81.0000 mg | DELAYED_RELEASE_TABLET | Freq: Every day | ORAL | Status: DC
  Administered 2023-08-11: 81 mg via ORAL
  Filled 2023-08-10: qty 1

## 2023-08-10 MED ORDER — BUPIVACAINE HCL (PF) 0.5 % IJ SOLN
INTRAMUSCULAR | Status: DC | PRN
  Administered 2023-08-10: 12 mL via PERINEURAL

## 2023-08-10 MED ORDER — MIDAZOLAM HCL 2 MG/2ML IJ SOLN
INTRAMUSCULAR | Status: AC
  Administered 2023-08-10: 1 mg via INTRAVENOUS
  Filled 2023-08-10: qty 2

## 2023-08-10 MED ORDER — ONDANSETRON HCL 4 MG/2ML IJ SOLN
INTRAMUSCULAR | Status: DC | PRN
  Administered 2023-08-10: 4 mg via INTRAVENOUS

## 2023-08-10 MED ORDER — DEXAMETHASONE SODIUM PHOSPHATE 4 MG/ML IJ SOLN
INTRAMUSCULAR | Status: DC | PRN
  Administered 2023-08-10: 5 mg via INTRAVENOUS

## 2023-08-10 MED ORDER — BUPIVACAINE HCL (PF) 0.5 % IJ SOLN: INTRAMUSCULAR | Status: DC | PRN

## 2023-08-10 MED ORDER — ACETAMINOPHEN 160 MG/5ML PO SOLN: 325.0000 mg | ORAL | Status: DC | PRN

## 2023-08-10 MED ORDER — ACETAMINOPHEN 10 MG/ML IV SOLN: 1000.0000 mg | Freq: Once | INTRAVENOUS | Status: DC | PRN

## 2023-08-10 MED ORDER — OXYCODONE HCL 5 MG/5ML PO SOLN: 5.0000 mg | Freq: Once | ORAL | Status: DC | PRN

## 2023-08-10 MED ORDER — ACETAMINOPHEN 325 MG PO TABS: 325.0000 mg | ORAL_TABLET | Freq: Four times a day (QID) | ORAL | Status: DC | PRN

## 2023-08-10 MED ORDER — VANCOMYCIN HCL 1000 MG IV SOLR
INTRAVENOUS | Status: DC | PRN
  Administered 2023-08-10: 1000 mg via TOPICAL

## 2023-08-10 MED ORDER — TRANEXAMIC ACID-NACL 1000-0.7 MG/100ML-% IV SOLN
1000.0000 mg | INTRAVENOUS | Status: AC
  Administered 2023-08-10: 1000 mg via INTRAVENOUS
  Filled 2023-08-10: qty 100

## 2023-08-10 MED ORDER — VANCOMYCIN HCL 1000 MG IV SOLR
INTRAVENOUS | Status: AC
Start: 2023-08-10 — End: ?
  Filled 2023-08-10: qty 20

## 2023-08-10 MED ORDER — PROPOFOL 10 MG/ML IV BOLUS
INTRAVENOUS | Status: DC | PRN
  Administered 2023-08-10: 150 mg via INTRAVENOUS

## 2023-08-10 MED ORDER — ONDANSETRON HCL 4 MG/2ML IJ SOLN
INTRAMUSCULAR | Status: AC
Start: 2023-08-10 — End: ?
  Filled 2023-08-10: qty 2

## 2023-08-10 MED ORDER — ORAL CARE MOUTH RINSE: 15.0000 mL | Freq: Once | OROMUCOSAL | Status: AC

## 2023-08-10 MED ORDER — LIDOCAINE HCL (CARDIAC) PF 100 MG/5ML IV SOSY
PREFILLED_SYRINGE | INTRAVENOUS | Status: DC | PRN
  Administered 2023-08-10: 80 mg via INTRAVENOUS

## 2023-08-10 MED ORDER — HYDROCODONE-ACETAMINOPHEN 5-325 MG PO TABS
1.0000 | ORAL_TABLET | ORAL | Status: DC | PRN
Start: 2023-08-10 — End: 2023-08-11

## 2023-08-10 MED ORDER — NITROGLYCERIN 0.4 MG SL SUBL: 0.4000 mg | SUBLINGUAL_TABLET | SUBLINGUAL | Status: DC | PRN

## 2023-08-10 MED ORDER — DROPERIDOL 2.5 MG/ML IJ SOLN: 0.6250 mg | Freq: Once | INTRAMUSCULAR | Status: DC | PRN

## 2023-08-10 MED ORDER — CEFAZOLIN SODIUM-DEXTROSE 2-4 GM/100ML-% IV SOLN
2.0000 g | Freq: Four times a day (QID) | INTRAVENOUS | Status: AC
  Administered 2023-08-10 (×2): 2 g via INTRAVENOUS
  Filled 2023-08-10 (×2): qty 100

## 2023-08-10 MED ORDER — SUGAMMADEX SODIUM 200 MG/2ML IV SOLN
INTRAVENOUS | Status: DC | PRN
  Administered 2023-08-10: 200 mg via INTRAVENOUS

## 2023-08-10 MED ORDER — FENTANYL CITRATE PF 50 MCG/ML IJ SOSY
PREFILLED_SYRINGE | INTRAMUSCULAR | Status: AC
  Administered 2023-08-10: 25 ug via INTRAVENOUS
  Filled 2023-08-10: qty 1

## 2023-08-10 MED ORDER — EPHEDRINE SULFATE (PRESSORS) 50 MG/ML IJ SOLN
INTRAMUSCULAR | Status: DC | PRN
  Administered 2023-08-10: 10 mg via INTRAVENOUS

## 2023-08-10 MED ORDER — DEXAMETHASONE SODIUM PHOSPHATE 10 MG/ML IJ SOLN
INTRAMUSCULAR | Status: AC
Start: 2023-08-10 — End: ?
  Filled 2023-08-10: qty 1

## 2023-08-10 MED ORDER — LACTATED RINGERS IV SOLN: INTRAVENOUS | Status: DC | PRN

## 2023-08-10 MED ORDER — OXYCODONE HCL 5 MG PO TABS: 5.0000 mg | ORAL_TABLET | ORAL | 0 refills | Status: DC | PRN

## 2023-08-10 MED ORDER — LORATADINE 10 MG PO TABS
10.0000 mg | ORAL_TABLET | Freq: Every day | ORAL | Status: DC
  Administered 2023-08-11: 10 mg via ORAL
  Filled 2023-08-10: qty 1

## 2023-08-10 MED ORDER — PHENYLEPHRINE HCL-NACL 20-0.9 MG/250ML-% IV SOLN
INTRAVENOUS | Status: DC | PRN
  Administered 2023-08-10: 25 ug/min via INTRAVENOUS

## 2023-08-10 MED ORDER — PROPOFOL 10 MG/ML IV BOLUS
INTRAVENOUS | Status: AC
Start: 2023-08-10 — End: ?
  Filled 2023-08-10: qty 20

## 2023-08-10 MED ORDER — DOCUSATE SODIUM 100 MG PO CAPS
100.0000 mg | ORAL_CAPSULE | Freq: Two times a day (BID) | ORAL | Status: DC
  Administered 2023-08-10 – 2023-08-11 (×2): 100 mg via ORAL
  Filled 2023-08-10 (×2): qty 1

## 2023-08-10 MED ORDER — LACTATED RINGERS IV SOLN
INTRAVENOUS | Status: DC
Start: 2023-08-10 — End: 2023-08-10

## 2023-08-10 MED ORDER — TRANEXAMIC ACID-NACL 1000-0.7 MG/100ML-% IV SOLN
1000.0000 mg | Freq: Once | INTRAVENOUS | Status: AC
Start: 2023-08-10 — End: 2023-08-10
  Administered 2023-08-10: 1000 mg via INTRAVENOUS
  Filled 2023-08-10: qty 100

## 2023-08-10 MED ORDER — ACETAMINOPHEN 325 MG PO TABS: 325.0000 mg | ORAL_TABLET | ORAL | Status: DC | PRN

## 2023-08-10 MED ORDER — CEFAZOLIN SODIUM-DEXTROSE 2-4 GM/100ML-% IV SOLN
2.0000 g | INTRAVENOUS | Status: AC
  Administered 2023-08-10: 2 g via INTRAVENOUS
  Filled 2023-08-10: qty 100

## 2023-08-10 MED ORDER — 0.9 % SODIUM CHLORIDE (POUR BTL) OPTIME
TOPICAL | Status: DC | PRN
  Administered 2023-08-10 (×2): 1000 mL

## 2023-08-10 MED ORDER — MENTHOL 3 MG MT LOZG: 1.0000 | LOZENGE | OROMUCOSAL | Status: DC | PRN

## 2023-08-10 MED ORDER — BUPIVACAINE LIPOSOME 1.3 % IJ SUSP
INTRAMUSCULAR | Status: DC | PRN
  Administered 2023-08-10: 10 mL via PERINEURAL

## 2023-08-10 MED ORDER — EZETIMIBE 10 MG PO TABS
10.0000 mg | ORAL_TABLET | Freq: Every day | ORAL | Status: DC
  Administered 2023-08-11: 10 mg via ORAL
  Filled 2023-08-10: qty 1

## 2023-08-10 MED ORDER — ONDANSETRON HCL 4 MG PO TABS: 4.0000 mg | ORAL_TABLET | Freq: Four times a day (QID) | ORAL | Status: DC | PRN

## 2023-08-10 MED ORDER — FENTANYL CITRATE PF 50 MCG/ML IJ SOSY: 25.0000 ug | PREFILLED_SYRINGE | INTRAMUSCULAR | Status: DC | PRN

## 2023-08-10 MED ORDER — CHLORHEXIDINE GLUCONATE 0.12 % MT SOLN
15.0000 mL | Freq: Once | OROMUCOSAL | Status: AC
  Administered 2023-08-10: 15 mL via OROMUCOSAL

## 2023-08-10 MED ORDER — MORPHINE SULFATE (PF) 2 MG/ML IV SOLN
0.5000 mg | INTRAVENOUS | Status: DC | PRN
Start: 2023-08-10 — End: 2023-08-11

## 2023-08-10 MED ORDER — MELATONIN 5 MG PO TABS
5.0000 mg | ORAL_TABLET | Freq: Every evening | ORAL | Status: DC | PRN
  Administered 2023-08-11: 5 mg via ORAL
  Filled 2023-08-10: qty 1

## 2023-08-10 MED ORDER — PHENOL 1.4 % MT LIQD: 1.0000 | OROMUCOSAL | Status: DC | PRN

## 2023-08-10 MED ORDER — LEVOTHYROXINE SODIUM 100 MCG PO TABS
100.0000 ug | ORAL_TABLET | Freq: Every day | ORAL | Status: DC
  Administered 2023-08-11: 100 ug via ORAL
  Filled 2023-08-10: qty 1

## 2023-08-10 MED ORDER — OXYCODONE HCL 5 MG PO TABS: 5.0000 mg | ORAL_TABLET | Freq: Once | ORAL | Status: DC | PRN

## 2023-08-10 MED ORDER — METOCLOPRAMIDE HCL 5 MG/ML IJ SOLN: 5.0000 mg | Freq: Three times a day (TID) | INTRAMUSCULAR | Status: DC | PRN

## 2023-08-10 MED ORDER — ONDANSETRON 4 MG PO TBDP: 4.0000 mg | ORAL_TABLET | Freq: Three times a day (TID) | ORAL | 0 refills | Status: DC | PRN

## 2023-08-10 MED ORDER — ATORVASTATIN CALCIUM 20 MG PO TABS
20.0000 mg | ORAL_TABLET | Freq: Every day | ORAL | Status: DC
  Administered 2023-08-11: 20 mg via ORAL
  Filled 2023-08-10: qty 1

## 2023-08-10 MED ORDER — ACETAMINOPHEN 500 MG PO TABS
500.0000 mg | ORAL_TABLET | Freq: Four times a day (QID) | ORAL | Status: DC
  Administered 2023-08-10 – 2023-08-11 (×3): 500 mg via ORAL
  Filled 2023-08-10 (×3): qty 1

## 2023-08-10 MED ORDER — ROCURONIUM BROMIDE 100 MG/10ML IV SOLN
INTRAVENOUS | Status: DC | PRN
  Administered 2023-08-10: 50 mg via INTRAVENOUS
  Administered 2023-08-10: 10 mg via INTRAVENOUS

## 2023-08-10 MED ORDER — METOCLOPRAMIDE HCL 5 MG PO TABS
5.0000 mg | ORAL_TABLET | Freq: Three times a day (TID) | ORAL | Status: DC | PRN
Start: 2023-08-10 — End: 2023-08-11

## 2023-08-10 MED ORDER — ONDANSETRON HCL 4 MG/2ML IJ SOLN: 4.0000 mg | Freq: Four times a day (QID) | INTRAMUSCULAR | Status: DC | PRN

## 2023-08-10 MED ORDER — HYDROCODONE-ACETAMINOPHEN 7.5-325 MG PO TABS
1.0000 | ORAL_TABLET | ORAL | Status: DC | PRN
Start: 2023-08-10 — End: 2023-08-11

## 2023-08-10 MED ORDER — MIDAZOLAM HCL 2 MG/2ML IJ SOLN: 1.0000 mg | Freq: Once | INTRAMUSCULAR | Status: AC

## 2023-08-10 MED ORDER — METOPROLOL SUCCINATE ER 25 MG PO TB24
12.5000 mg | ORAL_TABLET | Freq: Every day | ORAL | Status: DC
  Administered 2023-08-10 – 2023-08-11 (×2): 12.5 mg via ORAL
  Filled 2023-08-10 (×2): qty 1

## 2023-08-10 SURGICAL SUPPLY — 61 items
BAG COUNTER SPONGE SURGICOUNT (BAG) IMPLANT
BAG ZIPLOCK 12X15 (MISCELLANEOUS) ×1 IMPLANT
BIT DRILL 2.7 W/STOP DISP (BIT) IMPLANT
BIT DRILL TWIST 2.7 (BIT) IMPLANT
BLADE SAG 18X100X1.27 (BLADE) ×1 IMPLANT
CEMENT BONE DEPUY (Cement) IMPLANT
COMP HUM UNI IDENTI 36 +0 (Joint) IMPLANT
COMP REV AUG LG W/TAPER/GLENOI (Joint) ×1 IMPLANT
COMPONENT RV AUG LG W/TAPR/GLN (Joint) IMPLANT
COOLER ICEMAN CLASSIC (MISCELLANEOUS) ×1 IMPLANT
COVER BACK TABLE 60X90IN (DRAPES) ×1 IMPLANT
COVER SURGICAL LIGHT HANDLE (MISCELLANEOUS) ×1 IMPLANT
DRAPE SHEET LG 3/4 BI-LAMINATE (DRAPES) ×1 IMPLANT
DRAPE SURG 17X11 SM STRL (DRAPES) ×1 IMPLANT
DRAPE SURG ORHT 6 SPLT 77X108 (DRAPES) ×2 IMPLANT
DRAPE TOP 10253 STERILE (DRAPES) ×1 IMPLANT
DRAPE U-SHAPE 47X51 STRL (DRAPES) ×1 IMPLANT
DRSG AQUACEL AG ADV 3.5X 6 (GAUZE/BANDAGES/DRESSINGS) IMPLANT
DRSG AQUACEL AG ADV 3.5X10 (GAUZE/BANDAGES/DRESSINGS) ×1 IMPLANT
DURAPREP 26ML APPLICATOR (WOUND CARE) IMPLANT
ELECT REM PT RETURN 15FT ADLT (MISCELLANEOUS) ×1 IMPLANT
FACESHIELD WRAPAROUND (MASK) ×1
FACESHIELD WRAPAROUND OR TEAM (MASK) ×1 IMPLANT
GLENOID SPHERE 36MM CVD +3 (Orthopedic Implant) IMPLANT
GLOVE BIO SURGEON STRL SZ7.5 (GLOVE) ×4 IMPLANT
GLOVE BIOGEL PI IND STRL 8 (GLOVE) ×2 IMPLANT
GOWN STRL REUS W/ TWL XL LVL3 (GOWN DISPOSABLE) ×2 IMPLANT
KIT BASIN OR (CUSTOM PROCEDURE TRAY) ×1 IMPLANT
KIT TURNOVER KIT A (KITS) IMPLANT
MANIFOLD NEPTUNE II (INSTRUMENTS) ×1 IMPLANT
NDL TAPERED W/ NITINOL LOOP (MISCELLANEOUS) IMPLANT
NEEDLE TAPERED W/ NITINOL LOOP (MISCELLANEOUS)
NS IRRIG 1000ML POUR BTL (IV SOLUTION) ×1 IMPLANT
PACK SHOULDER (CUSTOM PROCEDURE TRAY) ×1 IMPLANT
PAD CAST 4YDX4 CTTN HI CHSV (CAST SUPPLIES) ×1 IMPLANT
PAD COLD SHLDR WRAP-ON (PAD) ×1 IMPLANT
PIN THREADED REVERSE (PIN) IMPLANT
REAMER GUIDE BUSHING SURG DISP (MISCELLANEOUS) IMPLANT
REAMER GUIDE W/SCREW AUG (MISCELLANEOUS) IMPLANT
RESTRAINT HEAD UNIVERSAL NS (MISCELLANEOUS) IMPLANT
SCREW BONE STRL 6.5MMX30MM (Screw) IMPLANT
SCREW LOCKING 4.75MMX15MM (Screw) IMPLANT
SCREW LOCKING STRL 4.75X25X3.5 (Screw) IMPLANT
SLING ARM IMMOBILIZER MED (SOFTGOODS) IMPLANT
SMARTMIX MINI TOWER (MISCELLANEOUS)
SPONGE T-LAP 4X18 ~~LOC~~+RFID (SPONGE) IMPLANT
STEM HUM IDENTITI MICRO 4 (Stem) IMPLANT
STRIP CLOSURE SKIN 1/2X4 (GAUZE/BANDAGES/DRESSINGS) ×1 IMPLANT
SUCTION TUBE FRAZIER 10FR DISP (SUCTIONS) ×1 IMPLANT
SUT FIBERWIRE #2 38 T-5 BLUE (SUTURE)
SUT MNCRL AB 3-0 PS2 18 (SUTURE) ×1 IMPLANT
SUT VIC AB 0 CT1 36 (SUTURE) ×1 IMPLANT
SUT VIC AB 1 CT1 36 (SUTURE) ×1 IMPLANT
SUT VIC AB 2-0 CT1 TAPERPNT 27 (SUTURE) ×1 IMPLANT
SUTURE FIBERWR #2 38 T-5 BLUE (SUTURE) IMPLANT
SUTURE TAPE 1.3 40 TPR END (SUTURE) ×1 IMPLANT
SUTURETAPE 1.3 40 TPR END (SUTURE) ×1
TOWEL OR 17X26 10 PK STRL BLUE (TOWEL DISPOSABLE) ×1 IMPLANT
TOWER SMARTMIX MINI (MISCELLANEOUS) IMPLANT
TRAY HUMERAL NEUTRAL EXT 6 (Shoulder) IMPLANT
TUBE SUCTION HIGH CAP CLEAR NV (SUCTIONS) ×1 IMPLANT

## 2023-08-10 NOTE — Anesthesia Preprocedure Evaluation (Addendum)
Anesthesia Evaluation  Patient identified by MRN, date of birth, ID band Patient awake    Reviewed: Allergy & Precautions, NPO status , Patient's Chart, lab work & pertinent test results  Airway Mallampati: II  TM Distance: >3 FB Neck ROM: Full    Dental  (+) Teeth Intact, Dental Advisory Given   Pulmonary neg pulmonary ROS   breath sounds clear to auscultation       Cardiovascular hypertension, + CAD and + Past MI   Rhythm:Regular Rate:Normal     Neuro/Psych negative neurological ROS  negative psych ROS   GI/Hepatic negative GI ROS, Neg liver ROS,,,  Endo/Other  Hypothyroidism    Renal/GU negative Renal ROS     Musculoskeletal  (+) Arthritis ,    Abdominal   Peds  Hematology negative hematology ROS (+)   Anesthesia Other Findings   Reproductive/Obstetrics                             Anesthesia Physical Anesthesia Plan  ASA: 3  Anesthesia Plan: General   Post-op Pain Management: Regional block*   Induction: Intravenous  PONV Risk Score and Plan: 4 or greater and Ondansetron, Dexamethasone and Treatment may vary due to age or medical condition  Airway Management Planned: Oral ETT  Additional Equipment: None  Intra-op Plan:   Post-operative Plan: Extubation in OR  Informed Consent: I have reviewed the patients History and Physical, chart, labs and discussed the procedure including the risks, benefits and alternatives for the proposed anesthesia with the patient or authorized representative who has indicated his/her understanding and acceptance.     Dental advisory given  Plan Discussed with: CRNA  Anesthesia Plan Comments:        Anesthesia Quick Evaluation

## 2023-08-10 NOTE — Op Note (Signed)
08/10/2023  11:21 AM  PATIENT:  Samantha Perkins    PRE-OPERATIVE DIAGNOSIS:  Right shoulder rotator cuff arthropathy  POST-OPERATIVE DIAGNOSIS:  Same  PROCEDURE:  REVERSE SHOULDER ARTHROPLASTY  SURGEON:  Yolonda Kida, MD  ASSISTANT: Dion Saucier, PA-C  Assistant attestation:  PA Mcclung present for the entire procedure.  ANESTHESIA:   General  ESTIMATED BLOOD LOSS: 50 cc  PREOPERATIVE INDICATIONS:  Samantha Perkins is a  73 y.o. female with a diagnosis of Right shoulder rotator cuff arthropathy who failed conservative measures and elected for surgical management.    The risks benefits and alternatives were discussed with the patient preoperatively including but not limited to the risks of infection, bleeding, nerve injury, cardiopulmonary complications, the need for revision surgery, dislocation, brachial plexus palsy, incomplete relief of pain, among others, and the patient was willing to proceed.  OPERATIVE IMPLANTS:  Zimmer identity size for micro stem -6 mm extended neutral humeral tray with a flat 36 mm standard humeral polyethylene liner Large baseplate augment oriented at the 11 o'clock position.  30 mm central screw with 4 peripheral locking screws and a 36 mm +3 lateralized glenosphere   OPERATIVE FINDINGS: Advanced rotator cuff arthropathy with deficient superior rotator cuff and high riding humeral head.  Long head of biceps tendon was intact and there were significant degenerative labral tearing.  Central full-thickness cartilage loss on the humeral head.  OPERATIVE PROCEDURE: The patient was brought to the operating room and placed in the supine position. General anesthesia was administered. IV antibiotics were given. A Foley was not placed. Time out was performed. The upper extremity was prepped and draped in usual sterile fashion. The patient was in a beachchair position. Deltopectoral approach was carried out. The biceps was tenotomized. The subscapularis  was released off of the bone.   I then performed circumferential releases of the humerus, and then dislocated the head, and then reamed with the reamer to the above named size.  I then applied the jig, and cut the humeral head in 30 of retroversion, and then turned my attention to the glenoid.  Deep retractors were placed, and I resected the labrum, and then placed a guidepin into the center position on the glenoid, with slight inferior inclination.  We then prepared for the large glenoid augment oriented at the 11 o'clock position.  The second step reamer was performed removing articular cartilage superiorly.  We then proceeded by impacting the large glenoid augmented at the 11 o'clock position.  I then reamed over the guidepin, and this created a small metaphyseal cancellus blush inferiorly, removing just the cartilage to the subchondral bone superiorly. The base plate was selected and impacted place, and then I secured it centrally with a nonlocking screw, and I had excellent purchase both inferiorly and superiorly. I placed a short locking screws on anterior and posterior aspects.  I then turned my attention to the glenosphere, and impacted this into place, placing slight inferior offset (set on A).   The glenoid sphere was completely seated, and had engagement of the [facility] taper. I then turned my attention back to the humerus.  I sequentially broached, and then trialed, and was found to restore soft tissue tension, and it had 2 finger tightness. Therefore the above named components were selected. The shoulder felt stable throughout functional motion.   I then impacted the real prosthesis into place, as well as the real humeral tray, and reduced the shoulder. The shoulder had excellent motion, and was stable, and I  irrigated the wounds copiously.   Subscapularis was not repaired  I then irrigated the shoulder copiously once more, placed 1 g of vancomycin powder repaired the deltopectoral  interval with #2 FiberWire followed by subcutaneous Vicryl, then monocryl for the skin,  with Steri-Strips and sterile gauze for the skin. The patient was awakened and returned back in stable and satisfactory condition. There no complications and they tolerated the procedure well.  All counts were correct x2.  Disposition:  Ms. Keatley will be in her sling for 2 weeks postoperatively.  She will use the arm immediately for activities of daily living.  No lifting over 2 pounds.  She will be admitted for postoperative observation.  Discharge home in a.m.  Will see her back in the office in 2 weeks with x-rays 2 views right shoulder.

## 2023-08-10 NOTE — H&P (Signed)
ORTHOPAEDIC H&P  REQUESTING PHYSICIAN: Yolonda Kida, MD  PCP:  Excell Seltzer, MD  Chief Complaint: Right shoulder arthritis  HPI: Samantha Perkins is a 73 y.o. female who complains of right shoulder pain and stiffness.  She has failed to improve significantly with conservative measures and is here today for reverse arthroplasty.  No new complaints.  Past Medical History:  Diagnosis Date   Arthritis    Carotid artery occlusion    Complication of anesthesia    trouble waking up and her O2 goes low   Coronary artery disease    Distal LCX MI 2007 in [place]. PCI unable to be performed.    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    Hypertension    Hypothyroidism    Hashimoto's Thyroiditis   Myocardial infarction (HCC)    Pre-diabetes    Thyroid disease    Past Surgical History:  Procedure Laterality Date   ABDOMINAL HYSTERECTOMY  1991   APPENDECTOMY  1977   BILATERAL OOPHORECTOMY  1991   CARDIAC CATHETERIZATION     CATARACT EXTRACTION, BILATERAL     GUM SURGERY     KNEE ARTHROSCOPY Right 11/08/2017   LASIK Bilateral    OVARIAN CYST REMOVAL     ROTATOR CUFF REPAIR Right 1982   ROTATOR CUFF REPAIR W/ DISTAL CLAVICLE EXCISION Left 07/05/2018   THYROID SURGERY  12/12/2007   TUBAL LIGATION     WRIST SURGERY Left    Social History   Socioeconomic History   Marital status: Married    Spouse name: Iona Hansen   Number of children: 0   Years of education: master's degree   Highest education level: Master's degree (e.g., MA, MS, MEng, MEd, MSW, MBA)  Occupational History   Occupation: retired Runner, broadcasting/film/video  Tobacco Use   Smoking status: Never   Smokeless tobacco: Never  Vaping Use   Vaping status: Never Used  Substance and Sexual Activity   Alcohol use: Yes    Comment: once every two weeks a glass of wine   Drug use: Never   Sexual activity: Not Currently    Birth control/protection: Surgical  Other Topics Concern   Not on file  Social History Narrative    07/26/20   From: [place], moved to be near children   Living: with husband, Iona Hansen (1980)   Work: retired - Production designer, theatre/television/film      Family: step daughter - Industrial/product designer - 2 step granddaughters - in the area      Enjoys: Associate Professor, explore new places, reading, volunteering       Exercise: not currently, joining the [employer]   Diet: tries to eat healthy, not as good as she did before      Safety   Seat belts: Yes    Guns: No   Safe in relationships: Yes    Social Drivers of Corporate investment banker Strain: Low Risk  (06/24/2023)   Overall Financial Resource Strain (CARDIA)    Difficulty of Paying Living Expenses: Not hard at all  Food Insecurity: No Food Insecurity (06/24/2023)   Hunger Vital Sign    Worried About Running Out of Food in the Last Year: Never true    Ran Out of Food in the Last Year: Never true  Transportation Needs: No Transportation Needs (06/24/2023)   PRAPARE - Administrator, Civil Service (Medical): No    Lack of Transportation (Non-Medical): No  Physical Activity: Insufficiently Active (06/24/2023)  Exercise Vital Sign    Days of Exercise per Week: 1 day    Minutes of Exercise per Session: 20 min  Stress: No Stress Concern Present (06/24/2023)   [employer] of Occupational Health - Occupational Stress Questionnaire    Feeling of Stress : Not at all  Social Connections: Unknown (06/24/2023)   Social Connection and Isolation Panel [NHANES]    Frequency of Communication with Friends and Family: Three times a week    Frequency of Social Gatherings with Friends and Family: Twice a week    Attends Religious Services: Patient declined    Database administrator or Organizations: No    Attends Engineer, structural: More than 4 times per year    Marital Status: Married   Family History  Problem Relation Age of Onset   Heart failure Mother    Hypothyroidism Mother    Aneurysm Mother    Stroke  Mother    Mitral valve prolapse Mother    Lung cancer Mother        smoker   Arthritis Father    Heart disease Father    Heart murmur Father    Diabetes Father    Hypertension Father    COPD Father    Diabetes Sister    Bladder Cancer Sister    Cataracts Sister    Arthritis Sister    Osteopenia Sister    Irritable bowel syndrome Sister    Mitral valve prolapse Sister    Diabetes Sister    Fibroids Sister    Lung cancer Sister        smoker   Mitral valve prolapse Sister    Hypertension Sister    Bradycardia Sister        w/ pacemaker   Crohn's disease Sister    Stroke Sister    Liver cancer Sister    Colon cancer Maternal Uncle        3 uncles    Pancreatic cancer Maternal Uncle        2 uncles with both colon and pancreatic   Diabetes Maternal Grandmother    Breast cancer Neg Hx    Allergies  Allergen Reactions   Cymbalta [Duloxetine Hcl] Rash   Tramadol Itching and Other (See Comments)    jittery   Duloxetine    Latex     Other reaction(s): Blisters   Wound Dressing Adhesive Other (See Comments)   Other Itching and Rash    Metal   Prior to Admission medications   Medication Sig Start Date End Date Taking? Authorizing Provider  aspirin EC 81 MG tablet Take 81 mg by mouth daily. Swallow whole.   Yes [provider]  atorvastatin (LIPITOR) 20 MG tablet Take 1 tablet by mouth once daily 11/29/22  Yes O'Neal, Ronnald Ramp, MD  Calcium Carb-Cholecalciferol (CALCIUM 600 + D PO) Take 1 tablet by mouth daily.   Yes [provider]  cetirizine (ZYRTEC) 10 MG tablet Take 10 mg by mouth daily as needed for allergies.   Yes [provider]  clobetasol (TEMOVATE) 0.05 % GEL Apply 1 application  topically 2 (two) times daily as needed (Unknown). 12/01/20  Yes [provider]  Coenzyme Q10 (COQ-10) 200 MG CAPS Take 200 mg by mouth daily at 6 (six) AM.   Yes [provider]  desonide (DESOWEN) 0.05 % cream Apply 1 Application  topically daily as needed (Skin). 02/25/20  Yes [provider]  diclofenac Sodium (VOLTAREN) 1 % GEL Apply 2  g topically daily as needed (pain).   Yes [provider]  Docosanol (ABREVA EX) Apply 1 Application topically 3 (three) times daily as needed (Fever blister).   Yes [provider]  ezetimibe (ZETIA) 10 MG tablet Take 1 tablet by mouth once daily 11/13/22  Yes O'Neal, Ronnald Ramp, MD  Ketotifen Fumarate (REFRESH EYE ITCH RELIEF OP) Place 1 drop into both eyes daily as needed (Dry eyes).   Yes [provider]  L-FORMULA LYSINE HCL PO Take 500 mg by mouth daily.   Yes [provider]  Lactobacillus-Inulin (CULTURELLE ADULT ULT BALANCE PO) Take 1 tablet by mouth daily.   Yes [provider]  melatonin 5 MG TABS Take 5 mg by mouth at bedtime as needed (Sleep).   Yes [provider]  metoprolol succinate (TOPROL-XL) 25 MG 24 hr tablet Take 1 tablet by mouth once daily Patient taking differently: Take 12.5 mg by mouth daily. 12/26/22  Yes O'Neal, Ronnald Ramp, MD  Multiple Vitamins-Minerals (ONE-A-DAY WOMENS 50+ ADVANTAGE PO) Take by mouth daily.   Yes [provider]  nitroGLYCERIN (NITROSTAT) 0.4 MG SL tablet PLACE 1 TABLET UNDER THE TONGUE AS NEEDED 02/13/23  Yes O'Neal, Ronnald Ramp, MD  PROLIA 60 MG/ML SOSY injection INJECT 60MG  INTO THE SKIN ONCE FOR 1 DOSE 05/11/23  Yes Bedsole, Amy E, MD  sodium chloride (OCEAN) 0.65 % SOLN nasal spray Place 1 spray into both nostrils daily as needed for congestion.   Yes [provider]  traMADol (ULTRAM) 50 MG tablet Take 50 mg by mouth every 6 (six) hours as needed. 06/20/23  Yes [provider]  valACYclovir (VALTREX) 1000 MG tablet TAKE 2 TABLETS BY MOUTH TWICE DAILY FOR  1  DAY Patient taking differently: Take 1,000 mg by mouth daily as needed (Fever Blisters). 03/26/23  Yes Bedsole, Amy E, MD  Vitafusion Fiber Well 2.5 g CHEW Chew 1 tablet by mouth daily.   Yes  [provider]  levothyroxine (SYNTHROID) 100 MCG tablet Take 1 tablet (100 mcg total) by mouth daily. 08/02/23   Shamleffer, Konrad Dolores, MD   No results found.  Positive ROS: All other systems have been reviewed and were otherwise negative with the exception of those mentioned in the HPI and as above.  Physical Exam: General: Alert, no acute distress Cardiovascular: No pedal edema Respiratory: No cyanosis, no use of accessory musculature GI: No organomegaly, abdomen is soft and non-tender Skin: No lesions in the area of chief complaint Neurologic: Sensation intact distally Psychiatric: Patient is competent for consent with normal mood and affect Lymphatic: No axillary or cervical lymphadenopathy  MUSCULOSKELETAL: Right upper extremity warm and well-perfused with no open wounds or lesions.  Neurovascular intact.  Assessment: Shoulder end-stage osteoarthritis  Plan: Plan to proceed today with reverse arthroplasty as definitive treatment for the right shoulder.  Previous history of rotator cuff repair thus we elected to go with a reverse.  No new complaints today.  We again discussed the risk and benefits of the procedure which include but not limited to bleeding, infection, damage to surrounding nerves and vessels, stiffness, fracture, dislocation, need for revision surgery as well as the risk of anesthesia.  She has provided informed consent.    Yolonda Kida, MD Cell [phone]    08/10/2023 7:07 AM

## 2023-08-10 NOTE — Transfer of Care (Signed)
Immediate Anesthesia Transfer of Care Note  Patient: Samantha Perkins  Procedure(s) Performed: REVERSE SHOULDER ARTHROPLASTY (Right: Shoulder)  Patient Location: PACU  Anesthesia Type:General  Level of Consciousness: awake and alert   Airway & Oxygen Therapy: Patient Spontanous Breathing and Patient connected to face mask oxygen  Post-op Assessment: Report given to RN and Post -op Vital signs reviewed and stable  Post vital signs: Reviewed and stable  Last Vitals:  Vitals Value Taken Time  BP 109/76 08/10/23 1145  Temp    Pulse 88 08/10/23 1146  Resp 14 08/10/23 1146  SpO2 99 % 08/10/23 1146  Vitals shown include unfiled device data.  Last Pain:  Vitals:   08/10/23 0730  TempSrc: Oral  PainSc: 4          Complications: No notable events documented.

## 2023-08-10 NOTE — Discharge Instructions (Signed)
Orthopedic surgery discharge instructions:  -Maintain postoperative bandage until follow-up appointment.  This is waterproof, and you may begin showering on postoperative day #3.  Do not submerge underwater.  Maintain that bandage until your follow-up appointment in 2 weeks.  -No lifting over 2 pounds with operateive arm.  You may use the arm immediately for activities of daily living such as bathing, washing your face and brushing your teeth, eating, and getting dressed.  Otherwise maintain your sling when you are out of the house and sleeping.  -Apply ice liberally to the shoulder throughout the day.  For mild to moderate pain use Tylenol and Advil as needed around-the-clock.  For breakthrough pain use oxycodone as necessary.  -You will return to see Dr. Aundria Rud in the office in 2 weeks for routine postoperative check with x-rays.

## 2023-08-10 NOTE — Brief Op Note (Signed)
08/10/2023  11:20 AM  PATIENT:  Samantha Perkins  73 y.o. female  PRE-OPERATIVE DIAGNOSIS:  Right shoulder rotator cuff arthropathy  POST-OPERATIVE DIAGNOSIS:  Right shoulder rotator cuff arthropathy  PROCEDURE:  Procedure(s): REVERSE SHOULDER ARTHROPLASTY (Right)  SURGEON:  Surgeons and Role:    * Yolonda Kida, MD - Primary  PHYSICIAN ASSISTANT: Dion Saucier, PA-C  ANESTHESIA:   regional and general  EBL:  20 mL   BLOOD ADMINISTERED:none  DRAINS: none   LOCAL MEDICATIONS USED:  NONE  SPECIMEN:  No Specimen  DISPOSITION OF SPECIMEN:  N/A  COUNTS:  YES  TOURNIQUET:  * No tourniquets in log *  DICTATION: .Note written in EPIC  PLAN OF CARE: Admit for overnight observation  PATIENT DISPOSITION:  PACU - hemodynamically stable.   Delay start of Pharmacological VTE agent (>24hrs) due to surgical blood loss or risk of bleeding: not applicable

## 2023-08-10 NOTE — Anesthesia Postprocedure Evaluation (Signed)
Anesthesia Post Note  Patient: Samantha Perkins  Procedure(s) Performed: REVERSE SHOULDER ARTHROPLASTY (Right: Shoulder)     Patient location during evaluation: PACU Anesthesia Type: General Level of consciousness: awake and alert Pain management: pain level controlled Vital Signs Assessment: post-procedure vital signs reviewed and stable Respiratory status: spontaneous breathing, nonlabored ventilation, respiratory function stable and patient connected to nasal cannula oxygen Cardiovascular status: blood pressure returned to baseline and stable Postop Assessment: no apparent nausea or vomiting Anesthetic complications: no  No notable events documented.  Last Vitals:  Vitals:   08/10/23 1215 08/10/23 1230  BP: 132/84 127/72  Pulse: 84 78  Resp: 13 12  Temp:    SpO2: 93% 92%    Last Pain:  Vitals:   08/10/23 1230  TempSrc:   PainSc: 0-No pain                 Shelton Silvas

## 2023-08-10 NOTE — Anesthesia Procedure Notes (Signed)
Anesthesia Regional Block: Interscalene brachial plexus block   Pre-Anesthetic Checklist: , timeout performed,  Correct Patient, Correct Site, Correct Laterality,  Correct Procedure, Correct Position, site marked,  Risks and benefits discussed,  Surgical consent,  Pre-op evaluation,  At surgeon's request and post-op pain management  Laterality: Right  Prep: chloraprep       Needles:  Injection technique: Single-shot  Needle Type: Echogenic Stimulator Needle     Needle Length: 9cm  Needle Gauge: 21     Additional Needles:   Procedures:,,,, ultrasound used (permanent image in chart),,    Narrative:  Start time: 08/10/2023 9:10 AM End time: 08/10/2023 9:15 AM Injection made incrementally with aspirations every 5 mL.  Performed by: Personally  Anesthesiologist: Shelton Silvas, MD  Additional Notes: Discussed risks and benefits of the nerve block in detail, including but not limited vascular injury, permanent nerve damage and infection.   Patient tolerated the procedure well. Local anesthetic introduced in an incremental fashion under minimal resistance after negative aspirations. No paresthesias were elicited. After completion of the procedure, no acute issues were identified and patient continued to be monitored by RN.

## 2023-08-10 NOTE — Anesthesia Procedure Notes (Signed)
Procedure Name: Intubation Date/Time: 08/10/2023 10:08 AM  Performed by: Deri Fuelling, CRNAPre-anesthesia Checklist: Patient identified, Emergency Drugs available, Suction available and Patient being monitored Patient Re-evaluated:Patient Re-evaluated prior to induction Oxygen Delivery Method: Circle system utilized Preoxygenation: Pre-oxygenation with 100% oxygen Induction Type: IV induction Ventilation: Mask ventilation without difficulty Laryngoscope Size: Mac and 3 Grade View: Grade I Tube type: Oral Tube size: 7.0 mm Number of attempts: 1 Airway Equipment and Method: Stylet and Oral airway Placement Confirmation: ETT inserted through vocal cords under direct vision, positive ETCO2 and breath sounds checked- equal and bilateral Secured at: 21 cm Tube secured with: Tape Dental Injury: Teeth and Oropharynx as per pre-operative assessment

## 2023-08-10 NOTE — Care Plan (Signed)
Ortho Bundle Case Management Note  Patient Details  Name: Samantha Perkins MRN: [id number] Date of Birth: 1951-06-04                  R Rev TSA on 08/10/23.  DCP: Home with husband, then best friend coming in second week to help as well.  DME: No needs.  PT: HEP   DME Arranged:  N/A DME Agency:      Additional Comments: Please contact me with any questions of if this plan should need to change.    Despina Pole, CCM Case Manager, [facility]  [phone] 08/10/2023, 9:02 AM

## 2023-08-11 ENCOUNTER — Other Ambulatory Visit (HOSPITAL_COMMUNITY): Payer: Self-pay

## 2023-08-11 DIAGNOSIS — Z91040 Latex allergy status: Secondary | ICD-10-CM | POA: Diagnosis not present

## 2023-08-11 DIAGNOSIS — Z7982 Long term (current) use of aspirin: Secondary | ICD-10-CM | POA: Diagnosis not present

## 2023-08-11 DIAGNOSIS — E039 Hypothyroidism, unspecified: Secondary | ICD-10-CM | POA: Diagnosis not present

## 2023-08-11 DIAGNOSIS — M75101 Unspecified rotator cuff tear or rupture of right shoulder, not specified as traumatic: Secondary | ICD-10-CM | POA: Diagnosis not present

## 2023-08-11 DIAGNOSIS — Z79899 Other long term (current) drug therapy: Secondary | ICD-10-CM | POA: Diagnosis not present

## 2023-08-11 DIAGNOSIS — I10 Essential (primary) hypertension: Secondary | ICD-10-CM | POA: Diagnosis not present

## 2023-08-11 DIAGNOSIS — M19011 Primary osteoarthritis, right shoulder: Secondary | ICD-10-CM | POA: Diagnosis not present

## 2023-08-11 DIAGNOSIS — I2510 Atherosclerotic heart disease of native coronary artery without angina pectoris: Secondary | ICD-10-CM | POA: Diagnosis not present

## 2023-08-11 LAB — BASIC METABOLIC PANEL
Anion gap: 10 (ref 5–15)
BUN: 11 mg/dL (ref 8–23)
CO2: 22 mmol/L (ref 22–32)
Calcium: 8.8 mg/dL — ABNORMAL LOW (ref 8.9–10.3)
Chloride: 106 mmol/L (ref 98–111)
Creatinine, Ser: 0.71 mg/dL (ref 0.44–1.00)
GFR, Estimated: >60 mL/min (ref >60)
Glucose, Bld: 139 mg/dL — ABNORMAL HIGH (ref 70–99)
Potassium: 4.2 mmol/L (ref 3.5–5.1)
Sodium: 138 mmol/L (ref 135–145)

## 2023-08-11 LAB — HEMOGLOBIN AND HEMATOCRIT, BLOOD
HCT: 37.1 % (ref 36.0–46.0)
Hemoglobin: 11.5 g/dL — ABNORMAL LOW (ref 12.0–15.0)

## 2023-08-11 MED ORDER — OXYCODONE HCL 5 MG PO TABS
5.0000 mg | ORAL_TABLET | ORAL | 0 refills | Status: DC | PRN
  Filled 2023-08-11: qty 25, 5d supply, fill #0

## 2023-08-11 MED ORDER — ONDANSETRON 4 MG PO TBDP
4.0000 mg | ORAL_TABLET | Freq: Three times a day (TID) | ORAL | 0 refills | Status: DC | PRN
  Filled 2023-08-11: qty 20, 7d supply, fill #0

## 2023-08-11 NOTE — Progress Notes (Signed)
    Subjective:  Patient reports pain as none this morning.  Denies N/V/CP/SOB/Abd pain. She reports some numbness still present this morning, Block still working. She is eager for d/c home.   Objective:   VITALS:   Vitals:   08/10/23 1814 08/10/23 2230 08/11/23 0128 08/11/23 0630  BP: (!) 136/54 119/67 122/63 (!) 111/55  Pulse: 91 89 79 63  Resp: 16 16 16 17   Temp: 98.3 F (36.8 C) 98.4 F (36.9 C) 98.4 F (36.9 C) 97.7 F (36.5 C)  TempSrc: Oral Oral Oral   SpO2:  98% 96% 96%  Weight: 89 kg     Height: 5' (1.524 m)       NAD. Sitting in recliner.  Sling in place.  Neurologically intact Neurovascular intact Intact pulses distally She is able to wiggle her fingers this morning.  Dressing C/D/I.   Lab Results  Component Value Date   WBC 6.9 08/02/2023   HGB 11.5 (L) 08/11/2023   HCT 37.1 08/11/2023   MCV 85.3 08/02/2023   PLT 265 08/02/2023   BMET    Component Value Date/Time   NA 138 08/11/2023 0402   K 4.2 08/11/2023 0402   CL 106 08/11/2023 0402   CO2 22 08/11/2023 0402   GLUCOSE 139 (H) 08/11/2023 0402   BUN 11 08/11/2023 0402   CREATININE 0.71 08/11/2023 0402   CALCIUM 8.8 (L) 08/11/2023 0402   GFRNONAA >60 08/11/2023 0402     Assessment/Plan: 1 Day Post-Op   Principal Problem:   S/P reverse total shoulder arthroplasty, right  OT to come by this morning.  Patient to remain in sling, NWB RUE. D/c home today. Follow up with Dr. Aundria Rud outpatient.   Clois Dupes 08/11/2023, 8:40 AM   [facility]  [place] [address], [place], [place] [address] Phone: [phone] [email or web address] Facebook  [employer]

## 2023-08-11 NOTE — Progress Notes (Signed)
   08/11/23 1133  TOC Brief Assessment  Insurance and Status Reviewed  Patient has primary care physician Yes  Home environment has been reviewed Home w/ spouse  Prior level of function: Independent  Prior/Current Home Services No current home services  Social Drivers of Health Review SDOH reviewed no interventions necessary  Readmission risk has been reviewed Yes  Transition of care needs no transition of care needs at this time   Pt to return home and follow HEP. No DME needs.

## 2023-08-11 NOTE — Evaluation (Signed)
Occupational Therapy Evaluation and Discharge Patient Details Name: Samantha Perkins MRN: [id number] DOB: 1951-01-12 Today's Date: 08/11/2023   History of Present Illness Pt is a 73 year old woman admitted on 1/24 for R reverse TSA. PMH:  B rotator cuff repairs, HTN, CAD, MI, hypothyroidism.   Clinical Impression   Pt educated in ADLs, NWB status, sling use, positioning R UE in bed and chair and prescribed R UE exercises. Instructed in use of ice man. Reinforced all information with written handouts. Pt verbalized and/or demonstrated understanding. Pt is discharging home in the care of her supportive husband.       If plan is discharge home, recommend the following: Assistance with cooking/housework;Assist for transportation    Functional Status Assessment  Patient has had a recent decline in their functional status and demonstrates the ability to make significant improvements in function in a reasonable and predictable amount of time.  Equipment Recommendations  None recommended by OT    Recommendations for Other Services       Precautions / Restrictions Precautions Precautions: Shoulder Type of Shoulder Precautions: A/PROM shoulder FF to 90, ER to 30, AROM elbow to hand Shoulder Interventions: Shoulder sling/immobilizer;Off for dressing/bathing/exercises Precaution Booklet Issued: Yes (comment) Required Braces or Orthoses: Sling Restrictions Weight Bearing Restrictions Per Provider Order: Yes RUE Weight Bearing Per Provider Order: Non weight bearing      Mobility Bed Mobility Overal bed mobility: Modified Independent                  Transfers Overall transfer level: Independent                        Balance Overall balance assessment: Independent                                         ADL either performed or assessed with clinical judgement   ADL Overall ADL's : Modified independent                                        General ADL Comments: Educated in compensatory strategies for ADLs adhering to shoulder precautions, donning/doffing sling and wearing schedule, positioning R UE in bed and chair, NWB on R UE.     Vision Ability to See in Adequate Light: 0 Adequate Patient Visual Report: No change from baseline       Perception         Praxis         Pertinent Vitals/Pain Pain Assessment Pain Assessment: No/denies pain     Extremity/Trunk Assessment Upper Extremity Assessment Upper Extremity Assessment: Right hand dominant (completed A/PROM FF to 90, ER to neutral in supine/sitting)   Lower Extremity Assessment Lower Extremity Assessment: Overall WFL for tasks assessed   Cervical / Trunk Assessment Cervical / Trunk Assessment: Normal   Communication Communication Communication: No apparent difficulties   Cognition Arousal: Alert Behavior During Therapy: WFL for tasks assessed/performed Overall Cognitive Status: Within Functional Limits for tasks assessed                                       General Comments       Exercises  Shoulder Instructions      Home Living Family/patient expects to be discharged to:: Private residence Living Arrangements: Spouse/significant other Available Help at Discharge: Family;Available 24 hours/day Type of Home: House       Home Layout: One level     Bathroom Shower/Tub: Producer, television/film/video: Standard     Home Equipment: Shower seat - built in          Prior Functioning/Environment Prior Level of Function : Independent/Modified Independent;Driving                        OT Problem List:        OT Treatment/Interventions:      OT Goals(Current goals can be found in the care plan section)    OT Frequency:      Co-evaluation              AM-PAC OT "6 Clicks" Daily Activity     Outcome Measure Help from another person eating meals?: A Little Help from another person  taking care of personal grooming?: None Help from another person toileting, which includes using toliet, bedpan, or urinal?: None Help from another person bathing (including washing, rinsing, drying)?: None Help from another person to put on and taking off regular upper body clothing?: None Help from another person to put on and taking off regular lower body clothing?: None 6 Click Score: 23   End of Session    Activity Tolerance: Patient tolerated treatment well Patient left: in chair;with nursing/sitter in room;with call bell/phone within reach  OT Visit Diagnosis: Pain                Time: 1610-9604 OT Time Calculation (min): 56 min Charges:  OT General Charges $OT Visit: 1 Visit OT Evaluation $OT Eval Low Complexity: 1 Low OT Treatments $Self Care/Home Management : 23-37 mins $Therapeutic Exercise: 8-22 mins  Berna Spare, OTR/L Acute Rehabilitation Services Office: [phone]   Evern Bio 08/11/2023, 11:09 AM

## 2023-08-11 NOTE — Care Management Obs Status (Signed)
MEDICARE OBSERVATION STATUS NOTIFICATION   Patient Details  Name: Samantha Perkins MRN: [id number] Date of Birth: [birth year]   Medicare Observation Status Notification Given:  Yes   This LCSW explained MOON to patient; Hadassa Cermak verbalized understanding, and signed document; copy of document given to patient.    Otelia Santee, LCSW 08/11/2023, 11:33 AM

## 2023-08-11 NOTE — Plan of Care (Signed)
  Problem: Education: Goal: Knowledge of General Education information will improve Description: Including pain rating scale, medication(s)/side effects and non-pharmacologic comfort measures Outcome: Adequate for Discharge   Problem: Health Behavior/Discharge Planning: Goal: Ability to manage health-related needs will improve Outcome: Adequate for Discharge   Problem: Clinical Measurements: Goal: Ability to maintain clinical measurements within normal limits will improve Outcome: Adequate for Discharge Goal: Will remain free from infection Outcome: Adequate for Discharge Goal: Diagnostic test results will improve Outcome: Adequate for Discharge Goal: Respiratory complications will improve Outcome: Adequate for Discharge Goal: Cardiovascular complication will be avoided Outcome: Adequate for Discharge   Problem: Activity: Goal: Risk for activity intolerance will decrease Outcome: Adequate for Discharge   Problem: Nutrition: Goal: Adequate nutrition will be maintained Outcome: Adequate for Discharge   Problem: Coping: Goal: Level of anxiety will decrease Outcome: Adequate for Discharge   Problem: Elimination: Goal: Will not experience complications related to bowel motility Outcome: Adequate for Discharge Goal: Will not experience complications related to urinary retention Outcome: Adequate for Discharge   Problem: Pain Managment: Goal: General experience of comfort will improve and/or be controlled Outcome: Adequate for Discharge   Problem: Safety: Goal: Ability to remain free from injury will improve Outcome: Adequate for Discharge   Problem: Skin Integrity: Goal: Risk for impaired skin integrity will decrease Outcome: Adequate for Discharge   Problem: Education: Goal: Knowledge of the prescribed therapeutic regimen will improve Outcome: Adequate for Discharge Goal: Understanding of activity limitations/precautions following surgery will improve Outcome:  Adequate for Discharge Goal: Individualized Educational Video(s) Outcome: Adequate for Discharge   Problem: Activity: Goal: Ability to tolerate increased activity will improve Outcome: Adequate for Discharge   Problem: Pain Management: Goal: Pain level will decrease with appropriate interventions Outcome: Adequate for Discharge

## 2023-08-12 NOTE — Discharge Summary (Signed)
Physician Discharge Summary  Patient ID: [name] MRN: [id number] DOB/AGE: 1950-09-13 73 y.o.  Admit date: 08/10/2023 Discharge date: 07/11/2023  Admission Diagnoses:  S/P reverse total shoulder arthroplasty, right  Discharge Diagnoses:  Principal Problem:   S/P reverse total shoulder arthroplasty, right   Past Medical History:  Diagnosis Date   Arthritis    Carotid artery occlusion    Complication of anesthesia    trouble waking up and her O2 goes low   Coronary artery disease    Distal LCX MI 2007 in [place]. PCI unable to be performed.    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 female who was admitted 08/10/2023 with a diagnosis of S/P reverse total shoulder arthroplasty, right and went to the operating room on 08/10/2023 and underwent the above named procedures.    She was given perioperative antibiotics:  Anti-infectives (From admission, onward)    Start     Dose/Rate Route Frequency Ordered Stop   08/10/23 1630  ceFAZolin (ANCEF) IVPB 2g/100 mL premix        2 g 200 mL/hr over 30 Minutes Intravenous Every 6 hours 08/10/23 1538 08/10/23 2257   08/10/23 1100  vancomycin (VANCOCIN) powder  Status:  Discontinued          As needed 08/10/23 1025 08/10/23 1533   08/10/23 0715  ceFAZolin (ANCEF) IVPB 2g/100 mL premix        2 g 200 mL/hr over 30 Minutes Intravenous On call to O.R. 08/10/23 0700 08/10/23 1031       She was given sequential compression devices, early ambulation.  POD#1 She was doing well. Pain controlled. She cleared OT. D/c home. Follow-up with Dr. Aundria Rud in 2 weeks.   She benefited maximally from the hospital stay and there were no  complications.    Recent vital signs:  Vitals:   08/11/23 0630 08/11/23 0927  BP: (!) 111/55 (!) 107/58  Pulse: 63 73  Resp: 17 17  Temp: 97.7 F (36.5 C) 98.7 F (37.1 C)  SpO2: 96% 96%    Recent laboratory studies:  Lab Results  Component Value Date   HGB 11.5 (L) 08/11/2023   HGB 13.4 08/02/2023   HGB 13.0 09/15/2021   Lab Results  Component Value Date   WBC 6.9 08/02/2023   PLT 265 08/02/2023   No results found for: "INR" Lab Results  Component Value Date   NA 138 08/11/2023   K 4.2 08/11/2023   CL 106 08/11/2023   CO2 22 08/11/2023   BUN 11 08/11/2023   CREATININE 0.71 08/11/2023   GLUCOSE 139 (H) 08/11/2023     Allergies as of 08/11/2023       Reactions   Cymbalta [duloxetine Hcl] Rash   Tramadol Itching, Other (See Comments)   jittery   Duloxetine    Latex    Other reaction(s): Blisters   Wound Dressing Adhesive Other (See Comments)   Other Itching, Rash   Metal        Medication List     STOP taking these medications    traMADol 50 MG tablet Commonly known as: ULTRAM       TAKE these medications  ABREVA EX Apply 1 Application topically 3 (three) times daily as needed (Fever blister).   acetaminophen 500 MG tablet Commonly known as: TYLENOL Take 1,000 mg by mouth every 6 (six) hours as needed.   aspirin EC 81 MG tablet Take 81 mg by mouth daily. Swallow whole.   atorvastatin 20 MG tablet Commonly known as: LIPITOR Take 1 tablet by mouth once daily   CALCIUM 600 + D PO Take 1 tablet by mouth daily.   cetirizine 10 MG tablet Commonly known as: ZYRTEC Take 10 mg by mouth daily as needed for allergies.   clobetasol 0.05 % Gel Commonly known as: TEMOVATE Apply 1 application  topically 2 (two) times daily as needed (Unknown).   CoQ-10 200 MG Caps Take 200 mg by mouth daily at 6 (six) AM.   CULTURELLE ADULT ULT BALANCE PO Take 1 tablet by mouth daily.   desonide 0.05 % cream Commonly known as: DESOWEN Apply 1  Application topically daily as needed (Skin).   diclofenac Sodium 1 % Gel Commonly known as: VOLTAREN Apply 2 g topically daily as needed (pain).   ezetimibe 10 MG tablet Commonly known as: ZETIA Take 1 tablet by mouth once daily   L-FORMULA LYSINE HCL PO Take 500 mg by mouth daily.   levothyroxine 100 MCG tablet Commonly known as: SYNTHROID Take 1 tablet (100 mcg total) by mouth daily.   melatonin 5 MG Tabs Take 5 mg by mouth at bedtime as needed (Sleep).   metoprolol succinate 25 MG 24 hr tablet Commonly known as: TOPROL-XL Take 1 tablet by mouth once daily What changed: how much to take   nitroGLYCERIN 0.4 MG SL tablet Commonly known as: NITROSTAT PLACE 1 TABLET UNDER THE TONGUE AS NEEDED   ondansetron 4 MG disintegrating tablet Commonly known as: ZOFRAN-ODT Take 1 tablet (4 mg total) by mouth every 8 (eight) hours as needed for nausea or vomiting.   ONE-A-DAY WOMENS 50+ ADVANTAGE PO Take by mouth daily.   oxyCODONE 5 MG immediate release tablet Commonly known as: Roxicodone Take 1 tablet (5 mg total) by mouth every 4 (four) hours as needed for moderate pain (pain score 4-6) or severe pain (pain score 7-10).   Prolia 60 MG/ML Sosy injection Generic drug: denosumab INJECT 60MG  INTO THE SKIN ONCE FOR 1 DOSE   REFRESH EYE ITCH RELIEF OP Place 1 drop into both eyes daily as needed (Dry eyes).   sodium chloride 0.65 % Soln nasal spray Commonly known as: OCEAN Place 1 spray into both nostrils daily as needed for congestion.   valACYclovir 1000 MG tablet Commonly known as: VALTREX TAKE 2 TABLETS BY MOUTH TWICE DAILY FOR  1  DAY What changed: See the new instructions.   Vitafusion Fiber Well 2.5 g Chew Chew 1 tablet by mouth daily.          WEIGHT BEARING   Other:  No lifting over 2 pounds with operateive arm   EXERCISES  No lifting over 2 pounds with operateive arm. You may use the arm immediately for activities of daily living such as bathing,  washing your face and brushing your teeth, eating, and getting dressed. Otherwise maintain your sling when you are out of the house and sleeping.    CONSTIPATION  Constipation is defined medically as fewer than three stools per week and severe constipation as less than one stool per week.  Even if you have a regular bowel pattern at home, your normal regimen is likely to be disrupted due to  multiple reasons following surgery.  Combination of anesthesia, postoperative narcotics, change in appetite and fluid intake all can affect your bowels.   YOU MUST use at least one of the following options; they are listed in order of increasing strength to get the job done.  They are all available over the counter, and you may need to use some, POSSIBLY even all of these options:    Drink plenty of fluids (prune juice may be helpful) and high fiber foods Colace 100 mg by mouth twice a day  Senokot for constipation as directed and as needed Dulcolax (bisacodyl), take with full glass of water  Miralax (polyethylene glycol) once or twice a day as needed.  If you have tried all these things and are unable to have a bowel movement in the first 3-4 days after surgery call either your surgeon or your primary doctor.    If you experience loose stools or diarrhea, hold the medications until you stool forms back up.  If your symptoms do not get better within 1 week or if they get worse, check with your doctor.  If you experience "the worst abdominal pain ever" or develop nausea or vomiting, please contact the office immediately for further recommendations for treatment.   ITCHING:  If you experience itching with your medications, try taking only a single pain pill, or even half a pain pill at a time.  You can also use Benadryl over the counter for itching or also to help with sleep.   MEDICATIONS:  See your medication summary on the "After Visit Summary" that nursing will review with you.  You may have some home  medications which will be placed on hold until you complete the course of blood thinner medication.  It is important for you to complete the blood thinner medication as prescribed.  PRECAUTIONS:  If you experience chest pain or shortness of breath - call 911 immediately for transfer to the hospital emergency department.   If you develop a fever greater that 101 F, purulent drainage from wound, increased redness or drainage from wound, foul odor from the wound/dressing, or calf pain - CONTACT YOUR SURGEON.                                                   FOLLOW-UP APPOINTMENTS:  If you do not already have a post-op appointment, please call the office for an appointment to be seen by your surgeon.  Guidelines for how soon to be seen are listed in your "After Visit Summary", but are typically between 1-4 weeks after surgery.   MAKE SURE YOU:  Understand these instructions.  Get help right away if you are not doing well or get worse.    Thank you for letting us be a part of your medical care team.  It is a privilege we respect greatly.  We hope these instructions will help you stay on track for a fast and full recovery!   Diagnostic Studies: DG Shoulder Right Port Result Date: 08/10/2023 CLINICAL DATA:  Status post total right shoulder arthroplasty. EXAM: RIGHT SHOULDER - 1 VIEW COMPARISON:  None Available. FINDINGS: Single frontal view of the right shoulder. Status post reverse total right shoulder arthroplasty. No perihardware lucency is seen to indicate hardware failure or loosening. Normal alignment of the glenohumeral prosthesis on frontal view. Normal alignment of the  acromioclavicular joint on frontal view with mild peripheral osteophytosis. No acute fracture or dislocation. Mild dextrocurvature of the mid to upper thoracic spine with mild-to-moderate multilevel degenerative disc changes. IMPRESSION: Status post reverse total right shoulder arthroplasty without evidence of hardware failure.  Electronically Signed   By: Neita Garnet M.D.   On: 08/10/2023 13:28    Disposition: Discharge disposition: 01-Home or Self Care       Discharge Instructions     Call MD / Call 911   Complete by: As directed    If you experience chest pain or shortness of breath, CALL 911 and be transported to the hospital emergency room.  If you develope a fever above 101 F, pus (white drainage) or increased drainage or redness at the wound, or calf pain, call your surgeon's office.   Constipation Prevention   Complete by: As directed    Drink plenty of fluids.  Prune juice may be helpful.  You may use a stool softener, such as Colace (over the counter) 100 mg twice a day.  Use MiraLax (over the counter) for constipation as needed.   Diet - low sodium heart healthy   Complete by: As directed    Increase activity slowly as tolerated   Complete by: As directed    Other:  Non-weightbearing right upper extremity. Maintain sling.   Post-operative opioid taper instructions:   Complete by: As directed    POST-OPERATIVE OPIOID TAPER INSTRUCTIONS: It is important to wean off of your opioid medication as soon as possible. If you do not need pain medication after your surgery it is ok to stop day one. Opioids include: Codeine, Hydrocodone(Norco, Vicodin), Oxycodone(Percocet, oxycontin) and hydromorphone amongst others.  Long term and even short term use of opiods can cause: Increased pain response Dependence Constipation Depression Respiratory depression And more.  Withdrawal symptoms can include Flu like symptoms Nausea, vomiting And more Techniques to manage these symptoms Hydrate well Eat regular healthy meals Stay active Use relaxation techniques(deep breathing, meditating, yoga) Do Not substitute Alcohol to help with tapering If you have been on opioids for less than two weeks and do not have pain than it is ok to stop all together.  Plan to wean off of opioids This plan should start  within one week post op of your joint replacement. Maintain the same interval or time between taking each dose and first decrease the dose.  Cut the total daily intake of opioids by one tablet each day Next start to increase the time between doses. The last dose that should be eliminated is the evening dose.           Follow-up Information     Yolonda Kida, MD. Schedule an appointment as soon as possible for a visit in 2 week(s).   Specialty: Orthopedic Surgery Why: [phone] Contact information: [address] [place] [address] [place] [address] [phone]                  Signed: Clois Dupes 08/12/2023, 12:12 AM

## 2023-08-14 ENCOUNTER — Encounter (HOSPITAL_COMMUNITY): Payer: Self-pay | Admitting: Orthopedic Surgery

## 2023-08-23 DIAGNOSIS — Z4789 Encounter for other orthopedic aftercare: Secondary | ICD-10-CM | POA: Diagnosis not present

## 2023-08-24 NOTE — Telephone Encounter (Signed)
 Pharmacy Patient Advocate Encounter   Received notification from [employer] that prior authorization for PROLIA  is required/requested.   Insurance verification completed.   The patient is insured through [employer] .   Per test claim: PA required; PA submitted to above mentioned insurance via CoverMyMeds Key/confirmation #/EOC [facility] Status is pending    Directv to check benefits, representative couldn't give any information. Did say that PA was required.  Ref# [id number]

## 2023-08-27 ENCOUNTER — Other Ambulatory Visit (HOSPITAL_COMMUNITY): Payer: Self-pay

## 2023-08-27 NOTE — Telephone Encounter (Signed)
 Pharmacy Patient Advocate Encounter  Received notification from [employer] that Prior Authorization for PROLIA  has been APPROVED from 09/08/20 to 07/16/24. Unable to obtain price due to refill too soon rejection, last fill date 05/15/23 next available fill date3/28/25   PA #/Case ID/Reference #: [id number]

## 2023-09-09 ENCOUNTER — Ambulatory Visit
Admission: EM | Admit: 2023-09-09 | Discharge: 2023-09-09 | Disposition: A | Discharge status: Home / Self Care | Payer: Medicare HMO | Attending: Emergency Medicine | Admitting: Emergency Medicine

## 2023-09-09 ENCOUNTER — Encounter: Payer: Self-pay | Admitting: *Deleted

## 2023-09-09 DIAGNOSIS — R319 Hematuria, unspecified: Secondary | ICD-10-CM | POA: Insufficient documentation

## 2023-09-09 DIAGNOSIS — N390 Urinary tract infection, site not specified: Secondary | ICD-10-CM | POA: Diagnosis not present

## 2023-09-09 LAB — POCT URINALYSIS DIP (MANUAL ENTRY)
Bilirubin, UA: NEGATIVE
Glucose, UA: NEGATIVE mg/dL
Ketones, POC UA: NEGATIVE mg/dL
Nitrite, UA: POSITIVE — AB
Protein Ur, POC: NEGATIVE mg/dL
Spec Grav, UA: 1.01 (ref 1.010–1.025)
Urobilinogen, UA: 0.2 U/dL
pH, UA: 7 (ref 5.0–8.0)

## 2023-09-09 MED ORDER — CEPHALEXIN 500 MG PO CAPS: 500.0000 mg | ORAL_CAPSULE | Freq: Three times a day (TID) | ORAL | 0 refills | Status: AC

## 2023-09-09 NOTE — Discharge Instructions (Signed)
 Take the antibiotic as directed.  The urine culture is pending.  We will call you if it shows the need to change or discontinue your antibiotic.    Follow up with your primary care provider if your symptoms are not improving.

## 2023-09-09 NOTE — ED Provider Notes (Signed)
 Renaldo Fiddler    CSN: [id number] Arrival date & time: 09/09/23  1048      History   Chief Complaint Chief Complaint  Patient presents with   Urinary Frequency   Dysuria    HPI Samantha Perkins is a 73 y.o. female.  Patient presents with 3-day history of dysuria and urinary frequency.  She has been treating her symptoms with Uristat.  No fever, chills, abdominal pain, flank pain, hematuria.  The history is provided by the patient and medical records.    Past Medical History:  Diagnosis Date   Arthritis    Carotid artery occlusion    Complication of anesthesia    trouble waking up and her O2 goes low   Coronary artery disease    Distal LCX MI 2007 in [place]. PCI unable to be performed.    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    Hypertension    Hypothyroidism    Hashimoto's Thyroiditis   Myocardial infarction [facility])    Pre-diabetes    Thyroid disease     Patient Active Problem List   Diagnosis Date Noted   S/P reverse total shoulder arthroplasty, right 08/10/2023   Postoperative hypothyroidism 08/01/2023   Pre-op evaluation 06/26/2023   Rectocele 12/26/2022   CAD (coronary artery disease) 06/28/2022   Persistent cough 06/28/2022   History of palpitations 06/28/2022   Hashimoto's disease 04/27/2022   Hormone replacement therapy 03/22/2022   Severe obesity with body mass index (BMI) of 35.0 to 39.9 with comorbidity (HCC) 09/15/2021   Encounter for monitoring postmenopausal estrogen replacement therapy 01/19/2021   History of lobectomy of thyroid 09/17/2020   Age-related osteoporosis with current pathological fracture 09/17/2020   History of MI (myocardial infarction) 07/26/2020   Thyroid nodule 07/26/2020   Hypothyroidism due to Hashimoto's thyroiditis 07/26/2020   Hyperlipidemia 07/26/2020    Past Surgical History:  Procedure Laterality Date   ABDOMINAL HYSTERECTOMY  1991   APPENDECTOMY  1977   BILATERAL OOPHORECTOMY  1991   CARDIAC  CATHETERIZATION     CATARACT EXTRACTION, BILATERAL     GUM SURGERY     KNEE ARTHROSCOPY Right 11/08/2017   LASIK Bilateral    OVARIAN CYST REMOVAL     REVERSE SHOULDER ARTHROPLASTY Right 08/10/2023   Procedure: REVERSE SHOULDER ARTHROPLASTY;  Surgeon: Yolonda Kida, MD;  Location: [facility];  Service: Orthopedics;  Laterality: Right;   ROTATOR CUFF REPAIR Right 1982   ROTATOR CUFF REPAIR W/ DISTAL CLAVICLE EXCISION Left 07/05/2018   THYROID SURGERY  12/12/2007   TUBAL LIGATION     WRIST SURGERY Left     OB History   No obstetric history on file.      Home Medications    Prior to Admission medications   Medication Sig Start Date End Date Taking? Authorizing Provider  cephALEXin (KEFLEX) 500 MG capsule Take 1 capsule (500 mg total) by mouth 3 (three) times daily for 5 days. 09/09/23 09/14/23 Yes Mickie Bail, NP  acetaminophen (TYLENOL) 500 MG tablet Take 1,000 mg by mouth every 6 (six) hours as needed.    [provider]  aspirin EC 81 MG tablet Take 81 mg by mouth daily. Swallow whole.    [provider]  atorvastatin (LIPITOR) 20 MG tablet Take 1 tablet by mouth once daily 11/29/22   O'Neal, Ronnald Ramp, MD  Calcium Carb-Cholecalciferol (CALCIUM 600 + D PO) Take 1 tablet by mouth daily.    [provider]  cetirizine (ZYRTEC) 10 MG tablet Take  10 mg by mouth daily as needed for allergies.    [provider]  clobetasol (TEMOVATE) 0.05 % GEL Apply 1 application  topically 2 (two) times daily as needed (Unknown). 12/01/20   [provider]  Coenzyme Q10 (COQ-10) 200 MG CAPS Take 200 mg by mouth daily at 6 (six) AM.    [provider]  desonide (DESOWEN) 0.05 % cream Apply 1 Application topically daily as needed (Skin). 02/25/20   [provider]  diclofenac Sodium (VOLTAREN) 1 % GEL Apply 2 g topically daily as needed (pain).    [provider]  Docosanol (ABREVA EX) Apply 1 Application topically 3 (three)  times daily as needed (Fever blister).    [provider]  ezetimibe (ZETIA) 10 MG tablet Take 1 tablet by mouth once daily 11/13/22   O'Neal, Ronnald Ramp, MD  Ketotifen Fumarate (REFRESH EYE [facility] RELIEF OP) Place 1 drop into both eyes daily as needed (Dry eyes).    [provider]  L-FORMULA LYSINE HCL PO Take 500 mg by mouth daily.    [provider]  Lactobacillus-Inulin (CULTURELLE ADULT ULT BALANCE PO) Take 1 tablet by mouth daily.    [provider]  levothyroxine (SYNTHROID) 100 MCG tablet Take 1 tablet (100 mcg total) by mouth daily. 08/02/23   Shamleffer, Konrad Dolores, MD  melatonin 5 MG TABS Take 5 mg by mouth at bedtime as needed (Sleep).    [provider]  metoprolol succinate (TOPROL-XL) 25 MG 24 hr tablet Take 1 tablet by mouth once daily Patient taking differently: Take 12.5 mg by mouth daily. 12/26/22   O'Neal, Ronnald Ramp, MD  Multiple Vitamins-Minerals (ONE-A-DAY WOMENS 50+ ADVANTAGE PO) Take by mouth daily.    [provider]  nitroGLYCERIN (NITROSTAT) 0.4 MG SL tablet PLACE 1 TABLET UNDER THE TONGUE AS NEEDED 02/13/23   O'Neal, Ronnald Ramp, MD  ondansetron (ZOFRAN-ODT) 4 MG disintegrating tablet Take 1 tablet (4 mg total) by mouth every 8 (eight) hours as needed for nausea or vomiting. 08/11/23   Hill, Alain Honey, PA-C  oxyCODONE (ROXICODONE) 5 MG immediate release tablet Take 1 tablet (5 mg total) by mouth every 4 (four) hours as needed for moderate pain (pain score 4-6) or severe pain (pain score 7-10). 08/11/23 08/10/24  Hill, Alain Honey, PA-C  PROLIA 60 MG/ML SOSY injection INJECT 60MG  INTO THE SKIN ONCE FOR 1 DOSE 05/11/23   Bedsole, Amy E, MD  sodium chloride (OCEAN) 0.65 % SOLN nasal spray Place 1 spray into both nostrils daily as needed for congestion.    [provider]  valACYclovir (VALTREX) 1000 MG tablet TAKE 2 TABLETS BY MOUTH TWICE DAILY FOR  1  DAY Patient taking differently: Take 1,000 mg by mouth daily  as needed (Fever Blisters). 03/26/23   Bedsole, Amy E, MD  Vitafusion Fiber Well 2.5 g CHEW Chew 1 tablet by mouth daily.    [provider]    Family History Family History  Problem Relation Age of Onset   Heart failure Mother    Hypothyroidism Mother    Aneurysm Mother    Stroke Mother    Mitral valve prolapse Mother    Lung cancer Mother        smoker   Arthritis Father    Heart disease Father    Heart murmur Father    Diabetes Father    Hypertension Father    COPD Father    Diabetes Sister    Bladder Cancer Sister  Cataracts Sister    Arthritis Sister    Osteopenia Sister    Irritable bowel syndrome Sister    Mitral valve prolapse Sister    Diabetes Sister    Fibroids Sister    Lung cancer Sister        smoker   Mitral valve prolapse Sister    Hypertension Sister    Bradycardia Sister        w/ pacemaker   Crohn's disease Sister    Stroke Sister    Liver cancer Sister    Colon cancer Maternal Uncle        3 uncles    Pancreatic cancer Maternal Uncle        2 uncles with both colon and pancreatic   Diabetes Maternal Grandmother    Breast cancer Neg Hx     Social History Social History   Tobacco Use   Smoking status: Never   Smokeless tobacco: Never  Vaping Use   Vaping status: Never Used  Substance Use Topics   Alcohol use: Yes    Comment: once every two weeks a glass of wine   Drug use: Never     Allergies   Cymbalta [duloxetine hcl], Tramadol, Duloxetine, Latex, Wound dressing adhesive, and Other   Review of Systems Review of Systems  Constitutional:  Negative for chills and fever.  Gastrointestinal:  Negative for abdominal pain, nausea and vomiting.  Genitourinary:  Positive for dysuria and frequency. Negative for flank pain and hematuria.     Physical Exam Triage Vital Signs ED Triage Vitals  Encounter Vitals Group     BP      Systolic BP Percentile      Diastolic BP Percentile      Pulse      Resp      Temp      Temp  src      SpO2      Weight      Height      Head Circumference      Peak Flow      Pain Score      Pain Loc      Pain Education      Exclude from Growth Chart    No data found.  Updated Vital Signs BP 113/75 (BP Location: Left Arm)   Pulse 76   Temp 98.6 F (37 C) (Oral)   Resp 18   Ht 5\' 3"  (1.6 m)   Wt 196 lb (88.9 kg)   SpO2 96%   BMI 34.72 kg/m   Visual Acuity Right Eye Distance:   Left Eye Distance:   Bilateral Distance:    Right Eye Near:   Left Eye Near:    Bilateral Near:     Physical Exam Constitutional:      General: She is not in acute distress. HENT:     Mouth/Throat:     Mouth: Mucous membranes are moist.  Cardiovascular:     Rate and Rhythm: Normal rate and regular rhythm.  Pulmonary:     Effort: Pulmonary effort is normal. No respiratory distress.  Abdominal:     General: Bowel sounds are normal.     Palpations: Abdomen is soft.     Tenderness: There is no abdominal tenderness. There is no right CVA tenderness, left CVA tenderness, guarding or rebound.  Neurological:     Mental Status: She is alert.      UC Treatments / Results  Labs (all labs ordered are listed, but only abnormal  results are displayed) Labs Reviewed  POCT URINALYSIS DIP (MANUAL ENTRY) - Abnormal; Notable for the following components:      Result Value   Color, UA orange (*)    Clarity, UA cloudy (*)    Blood, UA trace-intact (*)    Nitrite, UA Positive (*)    Leukocytes, UA Large (3+) (*)    All other components within normal limits  URINE CULTURE    EKG   Radiology No results found.  Procedures Procedures (including critical care time)  Medications Ordered in UC Medications - No data to display  Initial Impression / Assessment and Plan / UC Course  I have reviewed the triage vital signs and the nursing notes.  Pertinent labs & imaging results that were available during my care of the patient were reviewed by me and considered in my medical decision  making (see chart for details).   UTI.  Treating with Keflex. Urine culture pending. Discussed with patient that we will call her if the urine culture shows the need to change or discontinue the antibiotic. Instructed her to follow-up with her PCP if her symptoms are not improving. Patient agrees to plan of care.     Final Clinical Impressions(s) / UC Diagnoses   Final diagnoses:  Urinary tract infection with hematuria, site unspecified     Discharge Instructions      Take the antibiotic as directed.  The urine culture is pending.  We will call you if it shows the need to change or discontinue your antibiotic.    Follow-up with your primary care provider if your symptoms are not improving.      ED Prescriptions     Medication Sig Dispense Auth. Provider   cephALEXin (KEFLEX) 500 MG capsule Take 1 capsule (500 mg total) by mouth 3 (three) times daily for 5 days. 15 capsule Mickie Bail, NP      PDMP not reviewed this encounter.   Mickie Bail, NP 09/09/23 [phone]

## 2023-09-09 NOTE — ED Triage Notes (Signed)
 Patient states 3 days of urinary frequency and pain, no fever or chills.  Had a UTI late Dec '24.  Taking Uristat

## 2023-09-10 NOTE — Telephone Encounter (Signed)
 Placed a call to [facility] to check the benefits for Prolia injection.   Per the representative, prior authorization is required, no deductible, $15 copay, Max out of pocket of $6750 (30.00 met)  Prior authozation on file. PA# [id number] approved from 09/08/20 to 07/16/24.

## 2023-09-11 LAB — URINE CULTURE: Culture: >=100000 — AB

## 2023-09-11 NOTE — Telephone Encounter (Signed)
 Pt ready for scheduling for PROLIA on or after : 11/27/23  Option# 1: Buy/Bill (Office supplied medication)  Out-of-pocket cost due at time of clinic visit: $15  Number of injection/visits approved: 2  Primary: [employer] Prolia co-insurance: $15 Admin fee co-insurance: 0%  Secondary: --- Prolia co-insurance:  Admin fee co-insurance:   Medical Benefit Details: Date Benefits were checked: 09/10/23 Deductible: NO/ Coinsurance: $15/ Admin Fee: 0%  Prior Auth: APPROVED PA# [id number]  Expiration Date: 09/08/20-07/16/24   # of doses approved: 2 ----------------------------------------------------------------------- Option# 2- Med Obtained from pharmacy:  Pharmacy benefit: Copay $--- (Paid to pharmacy) Admin Fee: 0% (Pay at clinic)  Prior Auth: N/A PA# Expiration Date:   # of doses approved:   If patient wants fill through the pharmacy benefit please send prescription to: [employer], and include estimated need by date in rx notes. Pharmacy will ship medication directly to the office.  Patient NOT eligible for Prolia Copay Card. Copay Card can make patient's cost as little as $25. Link to apply: [email or web address]  ** This summary of benefits is an estimation of the patient's out-of-pocket cost. Exact cost may very based on individual plan coverage.

## 2023-09-16 NOTE — Progress Notes (Deleted)
  Cardiology Office Note:  .   Date:  09/16/2023  ID:  Samantha Perkins, DOB 07-25-1950, MRN [id number] PCP: Excell Seltzer, MD  [facility] Providers Cardiologist:  Reatha Harps, MD { Click to update primary MD,subspecialty MD or APP then REFRESH:1}   History of Present Illness: .   No chief complaint on file.   Samantha Perkins is a 73 y.o. female with history of CAD, HLD who presents for follow-up.      Problem List 1. CAD -Small inferior infarct 01/28/2006  -occluded distal LCX ([place]) -Cardiac PET 04/14/2020 lateral infarct, EF 59% 2. HLD -T chol 102, HDL 56, LDL 21, TG 123 3. Hypothyroidism  4. Osteopenia 5.  PACs -Treated with metoprolol 6.  Carotid artery disease -40 to 59% left ICA (2020; [place]) -no disease 09/08/2020    ROS: All other ROS reviewed and negative. Pertinent positives noted in the HPI.     Studies Reviewed: Marland Kitchen       Physical Exam:   VS:  There were no vitals taken for this visit.   Wt Readings from Last 3 Encounters:  09/09/23 196 lb (88.9 kg)  08/10/23 196 lb 3.4 oz (89 kg)  08/02/23 196 lb (88.9 kg)    GEN: Well nourished, well developed in no acute distress NECK: No JVD; No carotid bruits CARDIAC: ***RRR, no murmurs, rubs, gallops RESPIRATORY:  Clear to auscultation without rales, wheezing or rhonchi  ABDOMEN: Soft, non-tender, non-distended EXTREMITIES:  No edema; No deformity  ASSESSMENT AND PLAN: .   ***    {Are you ordering a CV Procedure (e.g. stress test, cath, DCCV, TEE, etc)?   Press F2        :[id number]}   Follow-up: No follow-ups on file.  Time Spent with Patient: I have spent a total of *** minutes caring for this patient today face to face, ordering and reviewing labs/tests, reviewing prior records/medical history, examining the patient, establishing an assessment and plan, communicating results/findings to the patient/family, and documenting in the medical record.   Signed, Lenna Gilford. Flora Lipps, MD,  [facility]  [facility]  [address] [place], [place] [address] [phone]  7:24 PM

## 2023-09-18 DIAGNOSIS — Z4789 Encounter for other orthopedic aftercare: Secondary | ICD-10-CM | POA: Diagnosis not present

## 2023-09-19 ENCOUNTER — Ambulatory Visit: Payer: Medicare HMO | Admitting: Cardiovascular Disease

## 2023-09-19 DIAGNOSIS — E782 Mixed hyperlipidemia: Secondary | ICD-10-CM

## 2023-09-19 DIAGNOSIS — I6523 Occlusion and stenosis of bilateral carotid arteries: Secondary | ICD-10-CM

## 2023-09-19 DIAGNOSIS — I2510 Atherosclerotic heart disease of native coronary artery without angina pectoris: Secondary | ICD-10-CM

## 2023-09-23 NOTE — Progress Notes (Unsigned)
  Cardiology Office Note:    Date:  09/23/2023  ID:  Samantha Perkins, DOB 1951/05/23, MRN [id number] PCP: Excell Seltzer, MD  [facility] Providers Cardiologist:  Reatha Harps, MD { Click to update primary MD,subspecialty MD or APP then REFRESH:1}    {Click to Open Review  :1}   Patient Profile:      Chief Complaint: 1 year follow-up for coronary artery disease, carotid artery disease, PACs, hyperlipidemia.  History of Present Illness:  Samantha Perkins is a 73 y.o. female with visit-pertinent history of coronary artery disease, carotid artery disease, PACs, hyperlipidemia, obesity.  She establish care with cardiology service on 09/06/2020.  She recently relocated to [facility] and was previously living in [place].  She has history of CAD with small inferior infarct on 01/28/2006 with totally occluded distal circumflex with unsuccessful angioplasty [facility]).  Carotid ultrasound in 2020 showed 40-59% left ICA stenosis [facility]).  Cardiac PET 04/14/2020 showed large, fixed perfusion defect of the lateral wall, no ischemia, findings consistent with prior MI, EF 59% [facility] [place]).  Vascular ultrasound duplex on 08/2020 showed no evidence of carotid artery disease.  She was last seen in clinic on 09/12/2022 by Dr. Bufford Buttner.  She was doing well at the time but no cardiovascular concerns or complaints.  No medication changes were made she was to follow-up in 1 year.  Discussed the use of AI scribe software for clinical note transcription with the patient, who gave verbal consent to proceed.  History of Present Illness            Review of systems:  Please see the history of present illness. All other systems are reviewed and otherwise negative. ***     Home Medications:    No outpatient medications have been marked as taking for the 09/24/23 encounter (Appointment) with Denyce Robert, NP.   Current Facility-Administered Medications for the  09/24/23 encounter (Appointment) with Denyce Robert, NP  Medication   denosumab (PROLIA) injection 60 mg   [START ON 11/27/2023] denosumab (PROLIA) injection 60 mg   Studies Reviewed:       *** Risk Assessment/Calculations:   {Does this patient have ATRIAL FIBRILLATION?[phone]} No BP recorded.  {Refresh Note OR Click here to enter BP  :1}***       Physical Exam:   VS:  There were no vitals taken for this visit.   Wt Readings from Last 3 Encounters:  09/09/23 196 lb (88.9 kg)  08/10/23 196 lb 3.4 oz (89 kg)  08/02/23 196 lb (88.9 kg)    GEN: Well nourished, well developed in no acute distress NECK: No JVD; No carotid bruits CARDIAC: ***RRR, no murmurs, rubs, gallops RESPIRATORY:  Clear to auscultation without rales, wheezing or rhonchi  ABDOMEN: Soft, non-tender, non-distended EXTREMITIES:  No edema; No acute deformity ***     Assessment and Plan:  Assessment and Plan              {Are you ordering a CV Procedure (e.g. stress test, cath, DCCV, TEE, etc)?   Press F2        :[id number]}  Dispo:  No follow-ups on file.  Signed, Denyce Robert, NP

## 2023-09-24 ENCOUNTER — Encounter: Payer: Self-pay | Admitting: Emergency Medicine

## 2023-09-24 ENCOUNTER — Ambulatory Visit: Attending: Emergency Medicine | Admitting: Emergency Medicine

## 2023-09-24 VITALS — BP 118/72 | HR 76 | Ht 60.75 in | Wt 199.0 lb

## 2023-09-24 DIAGNOSIS — E782 Mixed hyperlipidemia: Secondary | ICD-10-CM

## 2023-09-24 DIAGNOSIS — I6523 Occlusion and stenosis of bilateral carotid arteries: Secondary | ICD-10-CM | POA: Diagnosis not present

## 2023-09-24 DIAGNOSIS — R000 Tachycardia, unspecified: Secondary | ICD-10-CM | POA: Diagnosis not present

## 2023-09-24 DIAGNOSIS — E669 Obesity, unspecified: Secondary | ICD-10-CM

## 2023-09-24 DIAGNOSIS — I2510 Atherosclerotic heart disease of native coronary artery without angina pectoris: Secondary | ICD-10-CM

## 2023-09-24 MED ORDER — ATORVASTATIN CALCIUM 10 MG PO TABS
10.0000 mg | ORAL_TABLET | Freq: Every day | ORAL | 3 refills | Status: AC
Start: 2023-09-24 — End: ?

## 2023-09-24 NOTE — Patient Instructions (Addendum)
 Medication Instructions:  DECREASE YOUR LIPITOR TO 10 MG DAILY.   Lab Work: LIPID PANEL AND LFTs IN 6 WEEKS.   Testing/Procedures: NONE   Follow-Up: At [facility], you and your health needs are our priority.  As part of our continuing mission to provide you with exceptional heart care, we have created designated Provider Care Teams.  These Care Teams include your primary Cardiologist (physician) and Advanced Practice Providers (APPs -  Physician Assistants and Nurse Practitioners) who all work together to provide you with the care you need, when you need it.   Your next appointment:   6 MONTHS  Provider:   DR. Flora Lipps [facility]   Other Instructions:

## 2023-09-26 DIAGNOSIS — H524 Presbyopia: Secondary | ICD-10-CM | POA: Diagnosis not present

## 2023-09-26 DIAGNOSIS — Z961 Presence of intraocular lens: Secondary | ICD-10-CM | POA: Diagnosis not present

## 2023-09-26 DIAGNOSIS — D3132 Benign neoplasm of left choroid: Secondary | ICD-10-CM | POA: Diagnosis not present

## 2023-09-26 DIAGNOSIS — H04123 Dry eye syndrome of bilateral lacrimal glands: Secondary | ICD-10-CM | POA: Diagnosis not present

## 2023-09-26 DIAGNOSIS — H43813 Vitreous degeneration, bilateral: Secondary | ICD-10-CM | POA: Diagnosis not present

## 2023-09-26 DIAGNOSIS — H52203 Unspecified astigmatism, bilateral: Secondary | ICD-10-CM | POA: Diagnosis not present

## 2023-09-26 DIAGNOSIS — H35371 Puckering of macula, right eye: Secondary | ICD-10-CM | POA: Diagnosis not present

## 2023-09-27 DIAGNOSIS — M25511 Pain in right shoulder: Secondary | ICD-10-CM | POA: Diagnosis not present

## 2023-10-03 DIAGNOSIS — M25511 Pain in right shoulder: Secondary | ICD-10-CM | POA: Diagnosis not present

## 2023-10-09 DIAGNOSIS — M25511 Pain in right shoulder: Secondary | ICD-10-CM | POA: Diagnosis not present

## 2023-10-09 DIAGNOSIS — M7542 Impingement syndrome of left shoulder: Secondary | ICD-10-CM | POA: Diagnosis not present

## 2023-10-11 DIAGNOSIS — M25511 Pain in right shoulder: Secondary | ICD-10-CM | POA: Diagnosis not present

## 2023-10-16 DIAGNOSIS — M25511 Pain in right shoulder: Secondary | ICD-10-CM | POA: Diagnosis not present

## 2023-10-18 DIAGNOSIS — M25511 Pain in right shoulder: Secondary | ICD-10-CM | POA: Diagnosis not present

## 2023-10-23 DIAGNOSIS — M25511 Pain in right shoulder: Secondary | ICD-10-CM | POA: Diagnosis not present

## 2023-10-25 DIAGNOSIS — M25511 Pain in right shoulder: Secondary | ICD-10-CM | POA: Diagnosis not present

## 2023-10-26 ENCOUNTER — Telehealth: Payer: Self-pay | Admitting: *Deleted

## 2023-10-26 DIAGNOSIS — M8000XS Age-related osteoporosis with current pathological fracture, unspecified site, sequela: Secondary | ICD-10-CM

## 2023-10-26 NOTE — Telephone Encounter (Signed)
 Copied from CRM [id number]. Topic: Appointments - Appointment Scheduling >> Oct 26, 2023  2:24 PM Almira Coaster wrote: Patient/patient representative is calling to schedule an appointment. Refer to attachments for appointment information. Patient would like to schedule her next prolia injection.

## 2023-10-29 ENCOUNTER — Other Ambulatory Visit: Payer: Self-pay | Admitting: Cardiovascular Disease

## 2023-10-30 DIAGNOSIS — M25511 Pain in right shoulder: Secondary | ICD-10-CM | POA: Diagnosis not present

## 2023-11-01 DIAGNOSIS — Z4789 Encounter for other orthopedic aftercare: Secondary | ICD-10-CM | POA: Diagnosis not present

## 2023-11-01 DIAGNOSIS — M25511 Pain in right shoulder: Secondary | ICD-10-CM | POA: Diagnosis not present

## 2023-11-02 ENCOUNTER — Other Ambulatory Visit: Payer: Self-pay | Admitting: Family Medicine

## 2023-11-05 MED ORDER — DENOSUMAB 60 MG/ML ~~LOC~~ SOSY: 60.0000 mg | PREFILLED_SYRINGE | SUBCUTANEOUS | Status: AC

## 2023-11-05 NOTE — Addendum Note (Signed)
 Addended by: Dewanda Foots C on: 11/05/2023 02:52 PM   Modules accepted: Orders

## 2023-11-05 NOTE — Telephone Encounter (Addendum)
 Referral for prolia  from November got closed. New referral has been placed so that benefits can be ran.  Spoke with pt and made her aware of the above information.

## 2023-11-05 NOTE — Telephone Encounter (Signed)
 Last office visit 06/26/2023 for Pre-Op Clearance.  Last refilled 03/26/23 for #20 with no refills.  Next Appt: No future appointments with PCP.

## 2023-11-06 DIAGNOSIS — E782 Mixed hyperlipidemia: Secondary | ICD-10-CM | POA: Diagnosis not present

## 2023-11-06 DIAGNOSIS — R000 Tachycardia, unspecified: Secondary | ICD-10-CM | POA: Diagnosis not present

## 2023-11-06 DIAGNOSIS — I2510 Atherosclerotic heart disease of native coronary artery without angina pectoris: Secondary | ICD-10-CM | POA: Diagnosis not present

## 2023-11-06 DIAGNOSIS — M25511 Pain in right shoulder: Secondary | ICD-10-CM | POA: Diagnosis not present

## 2023-11-06 LAB — HEPATIC FUNCTION PANEL
ALT: 20 IU/L (ref 0–32)
AST: 20 IU/L (ref 0–40)
Albumin: 4.3 g/dL (ref 3.8–4.8)
Alkaline Phosphatase: 69 IU/L (ref 44–121)
Bilirubin Total: 0.4 mg/dL (ref 0.0–1.2)
Bilirubin, Direct: 0.18 mg/dL (ref 0.00–0.40)
Total Protein: 6.7 g/dL (ref 6.0–8.5)

## 2023-11-06 LAB — LIPID PANEL
Chol/HDL Ratio: 1.9 ratio (ref 0.0–4.4)
Cholesterol, Total: 139 mg/dL (ref 100–199)
HDL: 73 mg/dL (ref >39)
LDL Chol Calc (NIH): 48 mg/dL (ref 0–99)
Triglycerides: 99 mg/dL (ref 0–149)
VLDL Cholesterol Cal: 18 mg/dL (ref 5–40)

## 2023-11-08 DIAGNOSIS — M25511 Pain in right shoulder: Secondary | ICD-10-CM | POA: Diagnosis not present

## 2023-11-09 ENCOUNTER — Other Ambulatory Visit (HOSPITAL_COMMUNITY): Payer: Self-pay

## 2023-11-09 ENCOUNTER — Other Ambulatory Visit

## 2023-11-09 ENCOUNTER — Telehealth: Payer: Self-pay | Admitting: Family Medicine

## 2023-11-09 ENCOUNTER — Other Ambulatory Visit: Payer: Self-pay | Admitting: Family Medicine

## 2023-11-09 DIAGNOSIS — M8000XS Age-related osteoporosis with current pathological fracture, unspecified site, sequela: Secondary | ICD-10-CM

## 2023-11-09 NOTE — Telephone Encounter (Signed)
 Note:   I called the pt to go over her prolia  benefits verification information. She was confused because her last injection she paid $95 to the pharmacy and her medical benefit would have cost her over $350. Pt just wants to make sure that this benefits verification is correct and she is not going to get a huge bill. Thank you!

## 2023-11-12 ENCOUNTER — Other Ambulatory Visit (HOSPITAL_COMMUNITY): Payer: Self-pay

## 2023-11-12 ENCOUNTER — Telehealth: Payer: Self-pay

## 2023-11-12 NOTE — Telephone Encounter (Signed)
 Placed a call to [facility] to check benefits for Prolia .   Per the representative, cannot quote for for J-code but refer to CMS for fee schedule and for admin code [phone] there is no deductibe and $15 copay and no co-insurance   Phone# [phone]  Call reference: [phone]

## 2023-11-12 NOTE — Telephone Encounter (Signed)
 [employer] can no longer give estimates/quotes on non-chemotherapy drugs. Provider must follow the Fee schedule or use CMS website. The administration fee is $15.

## 2023-11-12 NOTE — Telephone Encounter (Signed)
 Called patient reviewed all following information including appointment, Co pay due at time of visit and if pick up of injection is needed from outside pharmacy.      Out of pocket for patient:  $15   Lab appointment :  11/23/23   Nurse visit: 11/28/23   Lab order placed: Yes   Prolia  has been   []   Ordered   [x]   Office supply medication will be used   []   Script sent to local pharmacy for patient to bring    []   Script sent to Specialty pharmacy    []   Script sent to [employer] to deliver

## 2023-11-12 NOTE — Addendum Note (Signed)
 Addended by: Dewanda Foots C on: 11/12/2023 02:40 PM   Modules accepted: Orders

## 2023-11-13 ENCOUNTER — Other Ambulatory Visit

## 2023-11-13 DIAGNOSIS — M25511 Pain in right shoulder: Secondary | ICD-10-CM | POA: Diagnosis not present

## 2023-11-14 ENCOUNTER — Ambulatory Visit

## 2023-11-15 DIAGNOSIS — M25511 Pain in right shoulder: Secondary | ICD-10-CM | POA: Diagnosis not present

## 2023-11-20 DIAGNOSIS — M25511 Pain in right shoulder: Secondary | ICD-10-CM | POA: Diagnosis not present

## 2023-11-21 ENCOUNTER — Other Ambulatory Visit: Payer: Self-pay | Admitting: Cardiovascular Disease

## 2023-11-21 ENCOUNTER — Encounter (HOSPITAL_COMMUNITY): Payer: Self-pay

## 2023-11-23 ENCOUNTER — Encounter: Payer: Self-pay | Admitting: Family Medicine

## 2023-11-23 ENCOUNTER — Other Ambulatory Visit (INDEPENDENT_AMBULATORY_CARE_PROVIDER_SITE_OTHER)

## 2023-11-23 DIAGNOSIS — M8000XS Age-related osteoporosis with current pathological fracture, unspecified site, sequela: Secondary | ICD-10-CM | POA: Diagnosis not present

## 2023-11-23 DIAGNOSIS — M8000XD Age-related osteoporosis with current pathological fracture, unspecified site, subsequent encounter for fracture with routine healing: Secondary | ICD-10-CM | POA: Diagnosis not present

## 2023-11-23 LAB — BASIC METABOLIC PANEL WITH GFR
BUN: 14 mg/dL (ref 6–23)
CO2: 30 meq/L (ref 19–32)
Calcium: 9.1 mg/dL (ref 8.4–10.5)
Chloride: 103 meq/L (ref 96–112)
Creatinine, Ser: 0.77 mg/dL (ref 0.40–1.20)
GFR: 77.03 mL/min (ref >60.00)
Glucose, Bld: 83 mg/dL (ref 70–99)
Potassium: 4.3 meq/L (ref 3.5–5.1)
Sodium: 140 meq/L (ref 135–145)

## 2023-11-27 DIAGNOSIS — M25511 Pain in right shoulder: Secondary | ICD-10-CM | POA: Diagnosis not present

## 2023-11-28 ENCOUNTER — Ambulatory Visit (INDEPENDENT_AMBULATORY_CARE_PROVIDER_SITE_OTHER)

## 2023-11-28 DIAGNOSIS — M8000XD Age-related osteoporosis with current pathological fracture, unspecified site, subsequent encounter for fracture with routine healing: Secondary | ICD-10-CM

## 2023-11-28 DIAGNOSIS — M8000XS Age-related osteoporosis with current pathological fracture, unspecified site, sequela: Secondary | ICD-10-CM | POA: Diagnosis not present

## 2023-11-28 MED ORDER — DENOSUMAB 60 MG/ML ~~LOC~~ SOSY
60.0000 mg | PREFILLED_SYRINGE | Freq: Once | SUBCUTANEOUS | Status: AC
  Administered 2023-11-28: 60 mg via SUBCUTANEOUS

## 2023-11-28 MED ORDER — DENOSUMAB 60 MG/ML ~~LOC~~ SOSY
60.0000 mg | PREFILLED_SYRINGE | Freq: Once | SUBCUTANEOUS | Status: AC
  Administered 2024-06-03: 60 mg via SUBCUTANEOUS

## 2023-11-28 NOTE — Progress Notes (Signed)
 Per orders of Dr. Kerby Nora, injection of prolia 60 mg Van given by Lewanda Rife in right arm.. Patient tolerated injection well. Patient will make appointment for 6 month.

## 2023-12-04 DIAGNOSIS — M25511 Pain in right shoulder: Secondary | ICD-10-CM | POA: Diagnosis not present

## 2023-12-11 DIAGNOSIS — M25511 Pain in right shoulder: Secondary | ICD-10-CM | POA: Diagnosis not present

## 2023-12-18 DIAGNOSIS — M25511 Pain in right shoulder: Secondary | ICD-10-CM | POA: Diagnosis not present

## 2023-12-21 NOTE — Telephone Encounter (Signed)
 Patient came into office inquring about prolia  bill from [place]: 5.14.25, states she received a bill for $300. She asked several times about the payment because she usually gets it at the pharmacy and brings to us  to save her money  Sent message to coding manager to review bill and advise, once received will follow-up with the patient:  [phone]

## 2023-12-25 DIAGNOSIS — M25511 Pain in right shoulder: Secondary | ICD-10-CM | POA: Diagnosis not present

## 2023-12-27 ENCOUNTER — Ambulatory Visit

## 2023-12-27 ENCOUNTER — Ambulatory Visit (INDEPENDENT_AMBULATORY_CARE_PROVIDER_SITE_OTHER)

## 2023-12-27 VITALS — BP 118/72 | Ht 60.75 in | Wt 195.0 lb

## 2023-12-27 DIAGNOSIS — Z2821 Immunization not carried out because of patient refusal: Secondary | ICD-10-CM

## 2023-12-27 DIAGNOSIS — Z0000 Encounter for general adult medical examination without abnormal findings: Secondary | ICD-10-CM

## 2023-12-27 NOTE — Patient Instructions (Signed)
 Ms. Schreiner , Thank you for taking time out of your busy schedule to complete your Annual Wellness Visit with me. I enjoyed our conversation and look forward to speaking with you again next year. I, as well as your care team,  appreciate your ongoing commitment to your health goals. Please review the following plan we discussed and let me know if I can assist you in the future. Your Game plan/ To Do List    Referrals: If you haven't heard from the office you've been referred to, please reach out to them at the phone provided.  None Follow up Visits: Next Medicare AWV with our clinical staff: 12/30/2024   Have you seen your provider in the last 6 months (3 months if uncontrolled diabetes)? No Next Office Visit with your provider: n/a  Clinician Recommendations:  Aim for 30 minutes of exercise or brisk walking, 6-8 glasses of water, and 5 servings of fruits and vegetables each day.       This is a list of the screening recommended for you and due dates:  Health Maintenance  Topic Date Due   COVID-19 Vaccine (8 - 2024-25 season) 09/23/2023   Flu Shot  02/15/2024   Colon Cancer Screening  10/25/2024   Medicare Annual Wellness Visit  12/26/2024   Mammogram  03/04/2025   DTaP/Tdap/Td vaccine (2 - Td or Tdap) 02/27/2029   Pneumococcal Vaccine for age over 43  Completed   DEXA scan (bone density measurement)  Completed   Hepatitis C Screening  Completed   Zoster (Shingles) Vaccine  Completed   HPV Vaccine  Aged Out   Meningitis B Vaccine  Aged Out    Advanced directives: (Declined) Advance directive discussed with you today. Even though you declined this today, please call our office should you change your mind, and we can give you the proper paperwork for you to fill out. Advance Care Planning is important because it:  [x]  Makes sure you receive the medical care that is consistent with your values, goals, and preferences  [x]  It provides guidance to your family and loved ones and reduces  their decisional burden about whether or not they are making the right decisions based on your wishes.  Follow the link provided in your after visit summary or read over the paperwork we have mailed to you to help you started getting your Advance Directives in place. If you need assistance in completing these, please reach out to us  so that we can help you!  See attachments for Preventive Care and Fall Prevention Tips.

## 2023-12-27 NOTE — Progress Notes (Signed)
 Because this visit was a virtual/telehealth visit,  certain criteria was not obtained, such a blood pressure, CBG if applicable, and timed get up and go. Any medications not marked as taking were not mentioned during the medication reconciliation part of the visit. Any vitals not documented were not able to be obtained due to this being a telehealth visit or patient was unable to self-report a recent blood pressure reading due to a lack of equipment at home via telehealth. Vitals that have been documented are verbally provided by the patient.   This visit was performed by a medical professional under my direct supervision. I was immediately available for consultation/collaboration. I have reviewed and agree with the Annual Wellness Visit documentation.  Subjective:   Samantha Perkins is a 73 y.o. who presents for a Medicare Wellness preventive visit.  As a reminder, Annual Wellness Visits don't include a physical exam, and some assessments may be limited, especially if this visit is performed virtually. We may recommend an in-person follow-up visit with your provider if needed.  Visit Complete: Virtual I connected with  Samantha Perkins on 12/27/23 by a audio enabled telemedicine application and verified that I am speaking with the correct person using two identifiers.  Patient Location: Home  Provider Location: Home Office  I discussed the limitations of evaluation and management by telemedicine. The patient expressed understanding and agreed to proceed.  Vital Signs: Because this visit was a virtual/telehealth visit, some criteria may be missing or patient reported. Any vitals not documented were not able to be obtained and vitals that have been documented are patient reported.  VideoDeclined- This patient declined Librarian, academic. Therefore the visit was completed with audio only.  Persons Participating in Visit: Patient.  AWV Questionnaire: No: Patient  Medicare AWV questionnaire was not completed prior to this visit.  Cardiac Risk Factors include: advanced age (>51men, >86 women);dyslipidemia;obesity (BMI >30kg/m2)     Objective:    Today's Vitals   12/27/23 1426  BP: 118/72  Weight: 195 lb (88.5 kg)  Height: 5' 0.75 (1.543 m)   Body mass index is 37.15 kg/m.     12/27/2023    2:25 PM 09/09/2023   11:01 AM 08/10/2023    6:00 PM 08/02/2023    2:36 PM 07/17/2023    5:54 PM 12/19/2022    8:41 AM 11/24/2021    2:49 PM  Advanced Directives  Does Patient Have a Medical Advance Directive? Yes No No No No Yes No  Type of Estate agent of [place];Living will     Healthcare Power of [place];Living will   Does patient want to make changes to medical advance directive? No - Patient declined        Copy of Healthcare Power of Attorney in Chart? No - copy requested     No - copy requested   Would patient like information on creating a medical advance directive?   Yes (Inpatient - patient defers creating a medical advance directive at this time - Information given) No - Patient declined       Current Medications (verified) Outpatient Encounter Medications as of 12/27/2023  Medication Sig   acetaminophen  (TYLENOL ) 500 MG tablet Take 1,000 mg by mouth every 6 (six) hours as needed.   aspirin  EC 81 MG tablet Take 81 mg by mouth daily. Swallow whole.   atorvastatin  (LIPITOR) 10 MG tablet Take 1 tablet (10 mg total) by mouth daily.   Calcium  Carb-Cholecalciferol (CALCIUM  600 + D PO)  Take 1 tablet by mouth daily.   cetirizine  (ZYRTEC ) 10 MG tablet Take 10 mg by mouth daily as needed for allergies.   clobetasol (TEMOVATE) 0.05 % GEL Apply 1 application  topically 2 (two) times daily as needed (Unknown).   Coenzyme Q10 (COQ-10) 200 MG CAPS Take 200 mg by mouth daily at 6 (six) AM.   desonide (DESOWEN) 0.05 % cream Apply 1 Application topically daily as needed (Skin).   diclofenac Sodium (VOLTAREN) 1 % GEL Apply 2 g topically  daily as needed (pain).   Docosanol (ABREVA EX) Apply 1 Application topically 3 (three) times daily as needed (Fever blister).   ezetimibe  (ZETIA ) 10 MG tablet Take 1 tablet by mouth once daily   Ketotifen Fumarate (REFRESH EYE ITCH RELIEF OP) Place 1 drop into both eyes daily as needed (Dry eyes).   L-FORMULA LYSINE HCL PO Take 500 mg by mouth daily.   Lactobacillus-Inulin (CULTURELLE ADULT ULT BALANCE PO) Take 1 tablet by mouth daily.   levothyroxine  (SYNTHROID ) 100 MCG tablet Take 1 tablet (100 mcg total) by mouth daily.   melatonin 5 MG TABS Take 5 mg by mouth at bedtime as needed (Sleep).   metoprolol  succinate (TOPROL -XL) 25 MG 24 hr tablet Take 1 tablet by mouth once daily (Patient taking differently: Take 12.5 mg by mouth daily.)   Multiple Vitamins-Minerals (ONE-A-DAY WOMENS 50+ ADVANTAGE PO) Take by mouth daily.   nitroGLYCERIN  (NITROSTAT ) 0.4 MG SL tablet PLACE 1 TABLET UNDER THE TONGUE AS NEEDED   PROLIA  60 MG/ML SOSY injection INJECT 60MG  INTO THE SKIN ONCE FOR 1 DOSE   sodium chloride  (OCEAN) 0.65 % SOLN nasal spray Place 1 spray into both nostrils daily as needed for congestion.   Vitafusion Fiber Well 2.5 g CHEW Chew 1 tablet by mouth daily.   ondansetron  (ZOFRAN -ODT) 4 MG disintegrating tablet Take 1 tablet (4 mg total) by mouth every 8 (eight) hours as needed for nausea or vomiting.   valACYclovir  (VALTREX ) 1000 MG tablet TAKE 2 TABLETS BY MOUTH TWICE DAILY FOR 1 DAY   Facility-Administered Encounter Medications as of 12/27/2023  Medication   denosumab  (PROLIA ) injection 60 mg   denosumab  (PROLIA ) injection 60 mg   denosumab  (PROLIA ) injection 60 mg   [START ON 05/30/2024] denosumab  (PROLIA ) injection 60 mg    Allergies (verified) Cymbalta [duloxetine hcl], Tramadol, Duloxetine, Latex, Wound dressing adhesive, and Other   History: Past Medical History:  Diagnosis Date   Arthritis    Carotid artery occlusion    Complication of anesthesia    trouble waking up and her  O2 goes low   Coronary artery disease    Distal LCX MI 2007 in [place]. PCI unable to be performed.    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    Hypertension    Hypothyroidism    Hashimoto's Thyroiditis   Myocardial infarction (HCC)    Pre-diabetes    Thyroid  disease    Past Surgical History:  Procedure Laterality Date   ABDOMINAL HYSTERECTOMY  1991   APPENDECTOMY  1977   BILATERAL OOPHORECTOMY  1991   CARDIAC CATHETERIZATION     CATARACT EXTRACTION, BILATERAL     GUM SURGERY     KNEE ARTHROSCOPY Right 11/08/2017   LASIK Bilateral    OVARIAN CYST REMOVAL     REVERSE SHOULDER ARTHROPLASTY Right 08/10/2023   Procedure: REVERSE SHOULDER ARTHROPLASTY;  Surgeon: Janeth Medicus, MD;  Location: [facility];  Service: Orthopedics;  Laterality: Right;   ROTATOR CUFF REPAIR Right 1982  ROTATOR CUFF REPAIR W/ DISTAL CLAVICLE EXCISION Left 07/05/2018   THYROID  SURGERY  12/12/2007   TUBAL LIGATION     WRIST SURGERY Left    Family History  Problem Relation Age of Onset   Heart failure Mother    Hypothyroidism Mother    Aneurysm Mother    Stroke Mother    Mitral valve prolapse Mother    Lung cancer Mother        smoker   Arthritis Father    Heart disease Father    Heart murmur Father    Diabetes Father    Hypertension Father    COPD Father    Diabetes Sister    Bladder Cancer Sister    Cataracts Sister    Arthritis Sister    Osteopenia Sister    Irritable bowel syndrome Sister    Mitral valve prolapse Sister    Diabetes Sister    Fibroids Sister    Lung cancer Sister        smoker   Mitral valve prolapse Sister    Hypertension Sister    Bradycardia Sister        w/ pacemaker   Crohn's disease Sister    Stroke Sister    Liver cancer Sister    Colon cancer Maternal Uncle        3 uncles    Pancreatic cancer Maternal Uncle        2 uncles with both colon and pancreatic   Diabetes Maternal Grandmother    Breast cancer Neg Hx    Social History   Socioeconomic  History   Marital status: Married    Spouse name: Levert Ready   Number of children: 0   Years of education: master's degree   Highest education level: Master's degree (e.g., MA, MS, MEng, MEd, MSW, MBA)  Occupational History   Occupation: retired Runner, broadcasting/film/video  Tobacco Use   Smoking status: Never   Smokeless tobacco: Never  Vaping Use   Vaping status: Never Used  Substance and Sexual Activity   Alcohol use: Yes    Comment: once every two weeks a glass of wine   Drug use: Never   Sexual activity: Not Currently    Birth control/protection: Surgical  Other Topics Concern   Not on file  Social History Narrative   07/26/20   From: [place] , moved to be near children   Living: with husband, Levert Ready (1980)   Work: retired - Production designer, theatre/television/film      Family: step daughter - Industrial/product designer - 2 step granddaughters - in the area      Enjoys: Associate Professor, explore new places, reading, volunteering       Exercise: not currently, joining the [employer]   Diet: tries to eat healthy, not as good as she did before      Safety   Seat belts: Yes    Guns: No   Safe in relationships: Yes    Social Drivers of Corporate investment banker Strain: Low Risk  (12/27/2023)   Overall Financial Resource Strain (CARDIA)    Difficulty of Paying Living Expenses: Not hard at all  Food Insecurity: No Food Insecurity (12/27/2023)   Hunger Vital Sign    Worried About Running Out of Food in the Last Year: Never true    Ran Out of Food in the Last Year: Never true  Transportation Needs: No Transportation Needs (12/27/2023)   PRAPARE - Administrator, Civil Service (Medical): No  Lack of Transportation (Non-Medical): No  Physical Activity: Sufficiently Active (12/27/2023)   Exercise Vital Sign    Days of Exercise per Week: 6 days    Minutes of Exercise per Session: 30 min  Stress: No Stress Concern Present (12/27/2023)   [employer] of Occupational Health - Occupational  Stress Questionnaire    Feeling of Stress: Not at all  Social Connections: Moderately Integrated (12/27/2023)   Social Connection and Isolation Panel    Frequency of Communication with Friends and Family: More than three times a week    Frequency of Social Gatherings with Friends and Family: Once a week    Attends Religious Services: Never    Database administrator or Organizations: Yes    Attends Engineer, structural: 1 to 4 times per year    Marital Status: Married    Tobacco Counseling Counseling given: Not Answered    Clinical Intake:  Pre-visit preparation completed: Yes  Pain : No/denies pain     BMI - recorded: 37.15 Nutritional Status: BMI > 30  Obese Nutritional Risks: None Diabetes: No  Lab Results  Component Value Date   HGBA1C 6.1 12/18/2022   HGBA1C 6.4 09/15/2021     How often do you need to have someone help you when you read instructions, pamphlets, or other written materials from your doctor or pharmacy?: 1 - Never What is the last grade level you completed in school?: MAster Degree  Interpreter Needed?: No  Information entered by :: Juliann Ochoa   Activities of Daily Living     12/26/2023    2:13 PM 12/24/2023   11:15 AM  In your present state of health, do you have any difficulty performing the following activities:  Hearing? 0 0  Vision? 0 0  Difficulty concentrating or making decisions? 1 1  Walking or climbing stairs? 1 1  Dressing or bathing? 0 0  Doing errands, shopping? 0 0  Preparing Food and eating ? N N  Using the Toilet? N N  In the past six months, have you accidently leaked urine? Y Y  Do you have problems with loss of bowel control? N N  Managing your Medications? N N  Managing your Finances? N N  Housekeeping or managing your Housekeeping? N N    Patient Care Team: Judithann Novas, MD as PCP - General (Family Medicine) O'Neal, Cathay Clonts, MD as PCP - Cardiology (Cardiology) O'Neal, Cathay Clonts, MD as  Consulting Physician (Cardiology) Rudine Cos, MD as Consulting Physician (Ophthalmology) Dermatology, [place] Harada, Arch Ko, MD as Consulting Physician (Orthopedic Surgery) Arvil Birks, MD as Consulting Physician (Orthopedic Surgery) Doroteo Gasmen, DDS (Dentistry) Abbey Abbe, DMD (Dentistry)  I have updated your Care Teams any recent Medical Services you may have received from other providers in the past year.     Assessment:   This is a routine wellness examination for Samantha Perkins.  Hearing/Vision screen Hearing Screening - Comments:: No hearing difficulties  Vision Screening - Comments:: Patient wears glasses    Goals Addressed             This Visit's Progress    Patient Stated   On track    11/24/2021, wants to be consistent with exercise and lose weight       Depression Screen     12/27/2023    2:29 PM 12/19/2022    8:36 AM 11/24/2021    2:53 PM 07/26/2020    4:13 PM  PHQ 2/9 Scores  PHQ - 2  Score 0 0 0 0  PHQ- 9 Score 6       Fall Risk     12/26/2023    2:13 PM 12/24/2023   11:15 AM 12/19/2022   11:42 AM 12/18/2022   10:05 PM 11/24/2021    2:53 PM  Fall Risk   Falls in the past year? 0 0 0 0 0  Number falls in past yr: 0 0 0 0 0  Injury with Fall? 0  0 0 0  Risk for fall due to :   No Fall Risks  Medication side effect  Follow up Falls evaluation completed  Falls prevention discussed;Falls evaluation completed Falls prevention discussed;Falls evaluation completed Falls evaluation completed;Education provided;Falls prevention discussed      Data saved with a previous flowsheet row definition    MEDICARE RISK AT HOME:  Medicare Risk at Home Any stairs in or around the home?: (Patient-Rptd) No If so, are there any without handrails?: (Patient-Rptd) No Home free of loose throw rugs in walkways, pet beds, electrical cords, etc?: (Patient-Rptd) Yes Adequate lighting in your home to reduce risk of falls?: (Patient-Rptd) Yes Life alert?:  (Patient-Rptd) No Use of a cane, walker or w/c?: (Patient-Rptd) No Grab bars in the bathroom?: (Patient-Rptd) No Shower chair or bench in shower?: (Patient-Rptd) Yes Elevated toilet seat or a handicapped toilet?: (Patient-Rptd) Yes  TIMED UP AND GO:  Was the test performed?  No  Cognitive Function: 6CIT completed        12/27/2023    2:27 PM 12/19/2022    8:38 AM 11/24/2021    2:55 PM  6CIT Screen  What Year? 0 points 0 points 0 points  What month? 0 points 0 points 0 points  What time? 0 points 0 points 0 points  Count back from 20 0 points 0 points 0 points  Months in reverse 0 points 0 points 0 points  Repeat phrase 0 points 0 points 2 points  Total Score 0 points 0 points 2 points    Immunizations Immunization History  Administered Date(s) Administered   Fluad Quad(high Dose 65+) 05/26/2020, 05/14/2021, 04/19/2022   Influenza-Unspecified 05/02/2023   Moderna Covid-19 Vaccine Bivalent Booster 74yrs & up 05/29/2021   Moderna SARS-COV2 Booster Vaccination 04/28/2022   Moderna Sars-Covid-2 Vaccination 08/05/2019, 09/09/2019, 05/08/2020, 11/05/2020   Pfizer(Comirnaty)Fall Seasonal Vaccine 12 years and older 03/26/2023   Pneumococcal Conjugate-13 05/01/2017   Pneumococcal Polysaccharide-23 07/13/2018   Tdap 02/28/2019   Zoster Recombinant(Shingrix) 05/18/2018, 01/01/2019    Screening Tests Health Maintenance  Topic Date Due   COVID-19 Vaccine (8 - 2024-25 season) 09/23/2023   INFLUENZA VACCINE  02/15/2024   Colonoscopy  10/25/2024   Medicare Annual Wellness (AWV)  12/26/2024   MAMMOGRAM  03/04/2025   DTaP/Tdap/Td (2 - Td or Tdap) 02/27/2029   Pneumococcal Vaccine: 50+ Years  Completed   DEXA SCAN  Completed   Hepatitis C Screening  Completed   Zoster Vaccines- Shingrix  Completed   HPV VACCINES  Aged Out   Meningococcal B Vaccine  Aged Out    Health Maintenance  Health Maintenance Due  Topic Date Due   COVID-19 Vaccine (8 - 2024-25 season) 09/23/2023    Health Maintenance Items Addressed:   Additional Screening:  Vision Screening: Recommended annual ophthalmology exams for early detection of glaucoma and other disorders of the eye. Would you like a referral to an eye doctor? No    Dental Screening: Recommended annual dental exams for proper oral hygiene  Community Resource Referral / Chronic Care Management:  CRR required this visit?  No   CCM required this visit?  No   Plan:    I have personally reviewed and noted the following in the patient's chart:   Medical and social history Use of alcohol, tobacco or illicit drugs  Current medications and supplements including opioid prescriptions. Patient is not currently taking opioid prescriptions. Functional ability and status Nutritional status Physical activity Advanced directives List of other physicians Hospitalizations, surgeries, and ER visits in previous 12 months Vitals Screenings to include cognitive, depression, and falls Referrals and appointments  In addition, I have reviewed and discussed with patient certain preventive protocols, quality metrics, and best practice recommendations. A written personalized care plan for preventive services as well as general preventive health recommendations were provided to patient.   Freeda Jerry, [place]   12/27/2023   After Visit Summary: (MyChart) Due to this being a telephonic visit, the after visit summary with patients personalized plan was offered to patient via MyChart   Notes: Nothing significant to report at this time.

## 2024-01-01 DIAGNOSIS — Z96611 Presence of right artificial shoulder joint: Secondary | ICD-10-CM | POA: Diagnosis not present

## 2024-01-01 DIAGNOSIS — M542 Cervicalgia: Secondary | ICD-10-CM | POA: Diagnosis not present

## 2024-01-01 DIAGNOSIS — M791 Myalgia, unspecified site: Secondary | ICD-10-CM | POA: Diagnosis not present

## 2024-01-01 NOTE — Progress Notes (Signed)
 Patient note has been copied and corrected with the correct date that questionnaire was completed.    Because this visit was a virtual/telehealth visit,  certain criteria was not obtained, such a blood pressure, CBG if applicable, and timed get up and go. Any medications not marked as taking were not mentioned during the medication reconciliation part of the visit. Any vitals not documented were not able to be obtained due to this being a telehealth visit or patient was unable to self-report a recent blood pressure reading due to a lack of equipment at home via telehealth. Vitals that have been documented are verbally provided by the patient.   This visit was performed by a medical professional under my direct supervision. I was immediately available for consultation/collaboration. I have reviewed and agree with the Annual Wellness Visit documentation.  Subjective:   Samantha Perkins is a 73 y.o. who presents for a Medicare Wellness preventive visit.  As a reminder, Annual Wellness Visits don't include a physical exam, and some assessments may be limited, especially if this visit is performed virtually. We may recommend an in-person follow-up visit with your provider if needed.  Visit Complete: Virtual I connected with  Samantha Perkins on 01/01/24 by a audio enabled telemedicine application and verified that I am speaking with the correct person using two identifiers.  Patient Location: Home  Provider Location: Home Office  I discussed the limitations of evaluation and management by telemedicine. The patient expressed understanding and agreed to proceed.  Vital Signs: Because this visit was a virtual/telehealth visit, some criteria may be missing or patient reported. Any vitals not documented were not able to be obtained and vitals that have been documented are patient reported.  VideoDeclined- This patient declined Librarian, academic. Therefore the visit was  completed with audio only.  Persons Participating in Visit: Patient.  AWV Questionnaire: Patient questionnaire was completed by the patient on 12/26/2023.  Cardiac Risk Factors include: advanced age (>72men, >29 women);dyslipidemia;obesity (BMI >30kg/m2)     Objective:    Today's Vitals   12/27/23 1426  BP: 118/72  Weight: 195 lb (88.5 kg)  Height: 5' 0.75 (1.543 m)   Body mass index is 37.15 kg/m.     12/27/2023    2:25 PM 09/09/2023   11:01 AM 08/10/2023    6:00 PM 08/02/2023    2:36 PM 07/17/2023    5:54 PM 12/19/2022    8:41 AM 11/24/2021    2:49 PM  Advanced Directives  Does Patient Have a Medical Advance Directive? Yes No No No No Yes No  Type of Estate agent of [place];Living will     Healthcare Power of [place];Living will   Does patient want to make changes to medical advance directive? No - Patient declined        Copy of Healthcare Power of Attorney in Chart? No - copy requested     No - copy requested   Would patient like information on creating a medical advance directive?   Yes (Inpatient - patient defers creating a medical advance directive at this time - Information given) No - Patient declined       Current Medications (verified) Outpatient Encounter Medications as of 12/27/2023  Medication Sig   acetaminophen  (TYLENOL ) 500 MG tablet Take 1,000 mg by mouth every 6 (six) hours as needed.   aspirin  EC 81 MG tablet Take 81 mg by mouth daily. Swallow whole.   atorvastatin  (LIPITOR) 10 MG tablet Take  1 tablet (10 mg total) by mouth daily.   Calcium  Carb-Cholecalciferol (CALCIUM  600 + D PO) Take 1 tablet by mouth daily.   cetirizine  (ZYRTEC ) 10 MG tablet Take 10 mg by mouth daily as needed for allergies.   clobetasol (TEMOVATE) 0.05 % GEL Apply 1 application  topically 2 (two) times daily as needed (Unknown).   Coenzyme Q10 (COQ-10) 200 MG CAPS Take 200 mg by mouth daily at 6 (six) AM.   desonide (DESOWEN) 0.05 % cream Apply 1 Application  topically daily as needed (Skin).   diclofenac Sodium (VOLTAREN) 1 % GEL Apply 2 g topically daily as needed (pain).   Docosanol (ABREVA EX) Apply 1 Application topically 3 (three) times daily as needed (Fever blister).   ezetimibe  (ZETIA ) 10 MG tablet Take 1 tablet by mouth once daily   Ketotifen Fumarate (REFRESH EYE ITCH RELIEF OP) Place 1 drop into both eyes daily as needed (Dry eyes).   L-FORMULA LYSINE HCL PO Take 500 mg by mouth daily.   Lactobacillus-Inulin (CULTURELLE ADULT ULT BALANCE PO) Take 1 tablet by mouth daily.   levothyroxine  (SYNTHROID ) 100 MCG tablet Take 1 tablet (100 mcg total) by mouth daily.   melatonin 5 MG TABS Take 5 mg by mouth at bedtime as needed (Sleep).   metoprolol  succinate (TOPROL -XL) 25 MG 24 hr tablet Take 1 tablet by mouth once daily (Patient taking differently: Take 12.5 mg by mouth daily.)   Multiple Vitamins-Minerals (ONE-A-DAY WOMENS 50+ ADVANTAGE PO) Take by mouth daily.   nitroGLYCERIN  (NITROSTAT ) 0.4 MG SL tablet PLACE 1 TABLET UNDER THE TONGUE AS NEEDED   PROLIA  60 MG/ML SOSY injection INJECT 60MG  INTO THE SKIN ONCE FOR 1 DOSE   sodium chloride  (OCEAN) 0.65 % SOLN nasal spray Place 1 spray into both nostrils daily as needed for congestion.   Vitafusion Fiber Well 2.5 g CHEW Chew 1 tablet by mouth daily.   ondansetron  (ZOFRAN -ODT) 4 MG disintegrating tablet Take 1 tablet (4 mg total) by mouth every 8 (eight) hours as needed for nausea or vomiting.   valACYclovir  (VALTREX ) 1000 MG tablet TAKE 2 TABLETS BY MOUTH TWICE DAILY FOR 1 DAY   Facility-Administered Encounter Medications as of 12/27/2023  Medication   denosumab  (PROLIA ) injection 60 mg   denosumab  (PROLIA ) injection 60 mg   denosumab  (PROLIA ) injection 60 mg   [START ON 05/30/2024] denosumab  (PROLIA ) injection 60 mg    Allergies (verified) Cymbalta [duloxetine hcl], Tramadol, Duloxetine, Latex, Wound dressing adhesive, and Other   History: Past Medical History:  Diagnosis Date    Arthritis    Carotid artery occlusion    Complication of anesthesia    trouble waking up and her O2 goes low   Coronary artery disease    Distal LCX MI 2007 in [place]. PCI unable to be performed.    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    Hypertension    Hypothyroidism    Hashimoto's Thyroiditis   Myocardial infarction (HCC)    Pre-diabetes    Thyroid  disease    Past Surgical History:  Procedure Laterality Date   ABDOMINAL HYSTERECTOMY  1991   APPENDECTOMY  1977   BILATERAL OOPHORECTOMY  1991   CARDIAC CATHETERIZATION     CATARACT EXTRACTION, BILATERAL     GUM SURGERY     KNEE ARTHROSCOPY Right 11/08/2017   LASIK Bilateral    OVARIAN CYST REMOVAL     REVERSE SHOULDER ARTHROPLASTY Right 08/10/2023   Procedure: REVERSE SHOULDER ARTHROPLASTY;  Surgeon: Janeth Medicus, MD;  Location: [facility];  Service: Orthopedics;  Laterality: Right;   ROTATOR CUFF REPAIR Right 1982   ROTATOR CUFF REPAIR W/ DISTAL CLAVICLE EXCISION Left 07/05/2018   THYROID  SURGERY  12/12/2007   TUBAL LIGATION     WRIST SURGERY Left    Family History  Problem Relation Age of Onset   Heart failure Mother    Hypothyroidism Mother    Aneurysm Mother    Stroke Mother    Mitral valve prolapse Mother    Lung cancer Mother        smoker   Arthritis Father    Heart disease Father    Heart murmur Father    Diabetes Father    Hypertension Father    COPD Father    Diabetes Sister    Bladder Cancer Sister    Cataracts Sister    Arthritis Sister    Osteopenia Sister    Irritable bowel syndrome Sister    Mitral valve prolapse Sister    Diabetes Sister    Fibroids Sister    Lung cancer Sister        smoker   Mitral valve prolapse Sister    Hypertension Sister    Bradycardia Sister        w/ pacemaker   Crohn's disease Sister    Stroke Sister    Liver cancer Sister    Colon cancer Maternal Uncle        3 uncles    Pancreatic cancer Maternal Uncle        2 uncles with both colon and  pancreatic   Diabetes Maternal Grandmother    Breast cancer Neg Hx    Social History   Socioeconomic History   Marital status: Married    Spouse name: Levert Ready   Number of children: 0   Years of education: master's degree   Highest education level: Master's degree (e.g., MA, MS, MEng, MEd, MSW, MBA)  Occupational History   Occupation: retired Runner, broadcasting/film/video  Tobacco Use   Smoking status: Never   Smokeless tobacco: Never  Vaping Use   Vaping status: Never Used  Substance and Sexual Activity   Alcohol use: Yes    Comment: once every two weeks a glass of wine   Drug use: Never   Sexual activity: Not Currently    Birth control/protection: Surgical  Other Topics Concern   Not on file  Social History Narrative   07/26/20   From: [place] , moved to be near children   Living: with husband, Levert Ready (1980)   Work: retired - Production designer, theatre/television/film      Family: step daughter - Industrial/product designer - 2 step granddaughters - in the area      Enjoys: Associate Professor, explore new places, reading, volunteering       Exercise: not currently, joining the [employer]   Diet: tries to eat healthy, not as good as she did before      Safety   Seat belts: Yes    Guns: No   Safe in relationships: Yes    Social Drivers of Corporate investment banker Strain: Low Risk  (12/27/2023)   Overall Financial Resource Strain (CARDIA)    Difficulty of Paying Living Expenses: Not hard at all  Food Insecurity: No Food Insecurity (12/27/2023)   Hunger Vital Sign    Worried About Running Out of Food in the Last Year: Never true    Ran Out of Food in the Last Year: Never true  Transportation Needs: No  Transportation Needs (12/27/2023)   PRAPARE - Administrator, Civil Service (Medical): No    Lack of Transportation (Non-Medical): No  Physical Activity: Sufficiently Active (12/27/2023)   Exercise Vital Sign    Days of Exercise per Week: 6 days    Minutes of Exercise per Session: 30 min   Stress: No Stress Concern Present (12/27/2023)   [employer] of Occupational Health - Occupational Stress Questionnaire    Feeling of Stress: Not at all  Social Connections: Moderately Integrated (12/27/2023)   Social Connection and Isolation Panel    Frequency of Communication with Friends and Family: More than three times a week    Frequency of Social Gatherings with Friends and Family: Once a week    Attends Religious Services: Never    Database administrator or Organizations: Yes    Attends Engineer, structural: 1 to 4 times per year    Marital Status: Married    Tobacco Counseling Counseling given: Not Answered    Clinical Intake:  Pre-visit preparation completed: Yes  Pain : No/denies pain     BMI - recorded: 37.15 Nutritional Status: BMI > 30  Obese Nutritional Risks: None Diabetes: No  Lab Results  Component Value Date   HGBA1C 6.1 12/18/2022   HGBA1C 6.4 09/15/2021     How often do you need to have someone help you when you read instructions, pamphlets, or other written materials from your doctor or pharmacy?: 1 - Never What is the last grade level you completed in school?: MAster Degree  Interpreter Needed?: No  Information entered by :: Juliann Ochoa   Activities of Daily Living     12/26/2023    2:13 PM 12/24/2023   11:15 AM  In your present state of health, do you have any difficulty performing the following activities:  Hearing? 0 0  Vision? 0 0  Difficulty concentrating or making decisions? 1 1  Walking or climbing stairs? 1 1  Dressing or bathing? 0 0  Doing errands, shopping? 0 0  Preparing Food and eating ? N N  Using the Toilet? N N  In the past six months, have you accidently leaked urine? Y Y  Do you have problems with loss of bowel control? N N  Managing your Medications? N N  Managing your Finances? N N  Housekeeping or managing your Housekeeping? N N    Patient Care Team: Judithann Novas, MD as PCP - General  (Family Medicine) O'Neal, Cathay Clonts, MD as PCP - Cardiology (Cardiology) O'Neal, Cathay Clonts, MD as Consulting Physician (Cardiology) Rudine Cos, MD as Consulting Physician (Ophthalmology) Dermatology, [place] Harada, Arch Ko, MD as Consulting Physician (Orthopedic Surgery) Arvil Birks, MD as Consulting Physician (Orthopedic Surgery) Doroteo Gasmen, DDS (Dentistry) Abbey Abbe, DMD (Dentistry)  I have updated your Care Teams any recent Medical Services you may have received from other providers in the past year.     Assessment:   This is a routine wellness examination for Mallisa.  Hearing/Vision screen Hearing Screening - Comments:: No hearing difficulties  Vision Screening - Comments:: Patient wears glasses    Goals Addressed             This Visit's Progress    Patient Stated   On track    11/24/2021, wants to be consistent with exercise and lose weight       Depression Screen     12/27/2023    2:29 PM 12/19/2022    8:36 AM 11/24/2021  2:53 PM 07/26/2020    4:13 PM  PHQ 2/9 Scores  PHQ - 2 Score 0 0 0 0  PHQ- 9 Score 6       Fall Risk     12/26/2023    2:13 PM 12/24/2023   11:15 AM 12/19/2022   11:42 AM 12/18/2022   10:05 PM 11/24/2021    2:53 PM  Fall Risk   Falls in the past year? 0 0 0 0 0  Number falls in past yr: 0 0 0 0 0  Injury with Fall? 0  0 0 0  Risk for fall due to :   No Fall Risks  Medication side effect  Follow up Falls evaluation completed  Falls prevention discussed;Falls evaluation completed Falls prevention discussed;Falls evaluation completed Falls evaluation completed;Education provided;Falls prevention discussed      Data saved with a previous flowsheet row definition    MEDICARE RISK AT HOME:  Medicare Risk at Home Any stairs in or around the home?: (Patient-Rptd) No If so, are there any without handrails?: (Patient-Rptd) No Home free of loose throw rugs in walkways, pet beds, electrical cords, etc?:  (Patient-Rptd) Yes Adequate lighting in your home to reduce risk of falls?: (Patient-Rptd) Yes Life alert?: (Patient-Rptd) No Use of a cane, walker or w/c?: (Patient-Rptd) No Grab bars in the bathroom?: (Patient-Rptd) No Shower chair or bench in shower?: (Patient-Rptd) Yes Elevated toilet seat or a handicapped toilet?: (Patient-Rptd) Yes  TIMED UP AND GO:  Was the test performed?  No  Cognitive Function: 6CIT completed        12/27/2023    2:27 PM 12/19/2022    8:38 AM 11/24/2021    2:55 PM  6CIT Screen  What Year? 0 points 0 points 0 points  What month? 0 points 0 points 0 points  What time? 0 points 0 points 0 points  Count back from 20 0 points 0 points 0 points  Months in reverse 0 points 0 points 0 points  Repeat phrase 0 points 0 points 2 points  Total Score 0 points 0 points 2 points    Immunizations Immunization History  Administered Date(s) Administered   Fluad Quad(high Dose 65+) 05/26/2020, 05/14/2021, 04/19/2022   Influenza-Unspecified 05/02/2023   Moderna Covid-19 Vaccine Bivalent Booster 60yrs & up 05/29/2021   Moderna SARS-COV2 Booster Vaccination 04/28/2022   Moderna Sars-Covid-2 Vaccination 08/05/2019, 09/09/2019, 05/08/2020, 11/05/2020   Pfizer(Comirnaty)Fall Seasonal Vaccine 12 years and older 03/26/2023   Pneumococcal Conjugate-13 05/01/2017   Pneumococcal Polysaccharide-23 07/13/2018   Tdap 02/28/2019   Zoster Recombinant(Shingrix) 05/18/2018, 01/01/2019    Screening Tests Health Maintenance  Topic Date Due   COVID-19 Vaccine (8 - 2024-25 season) 09/23/2023   INFLUENZA VACCINE  02/15/2024   Colonoscopy  10/25/2024   Medicare Annual Wellness (AWV)  12/26/2024   MAMMOGRAM  03/04/2025   DTaP/Tdap/Td (2 - Td or Tdap) 02/27/2029   Pneumococcal Vaccine: 50+ Years  Completed   DEXA SCAN  Completed   Hepatitis C Screening  Completed   Zoster Vaccines- Shingrix  Completed   HPV VACCINES  Aged Out   Meningococcal B Vaccine  Aged Out    Health  Maintenance  Health Maintenance Due  Topic Date Due   COVID-19 Vaccine (8 - 2024-25 season) 09/23/2023   Health Maintenance Items Addressed:   Additional Screening:  Vision Screening: Recommended annual ophthalmology exams for early detection of glaucoma and other disorders of the eye. Would you like a referral to an eye doctor? No    Dental Screening:  Recommended annual dental exams for proper oral hygiene  Community Resource Referral / Chronic Care Management: CRR required this visit?  No   CCM required this visit?  No   Plan:    I have personally reviewed and noted the following in the patient's chart:   Medical and social history Use of alcohol, tobacco or illicit drugs  Current medications and supplements including opioid prescriptions. Patient is not currently taking opioid prescriptions. Functional ability and status Nutritional status Physical activity Advanced directives List of other physicians Hospitalizations, surgeries, and ER visits in previous 12 months Vitals Screenings to include cognitive, depression, and falls Referrals and appointments  In addition, I have reviewed and discussed with patient certain preventive protocols, quality metrics, and best practice recommendations. A written personalized care plan for preventive services as well as general preventive health recommendations were provided to patient.   Freeda Jerry, [place]   01/01/2024   After Visit Summary: (MyChart) Due to this being a telephonic visit, the after visit summary with patients personalized plan was offered to patient via MyChart   Notes: Nothing significant to report at this time.

## 2024-01-03 ENCOUNTER — Other Ambulatory Visit: Payer: Self-pay | Admitting: Cardiovascular Disease

## 2024-01-04 ENCOUNTER — Other Ambulatory Visit (INDEPENDENT_AMBULATORY_CARE_PROVIDER_SITE_OTHER)

## 2024-01-04 ENCOUNTER — Other Ambulatory Visit: Payer: Self-pay | Admitting: Primary Care

## 2024-01-04 ENCOUNTER — Ambulatory Visit: Payer: Self-pay | Admitting: Primary Care

## 2024-01-04 DIAGNOSIS — R7303 Prediabetes: Secondary | ICD-10-CM

## 2024-01-04 DIAGNOSIS — M542 Cervicalgia: Secondary | ICD-10-CM | POA: Diagnosis not present

## 2024-01-04 LAB — CBC
HCT: 39.7 % (ref 36.0–46.0)
Hemoglobin: 12.9 g/dL (ref 12.0–15.0)
MCHC: 32.4 g/dL (ref 30.0–36.0)
MCV: 80.1 fl (ref 78.0–100.0)
Platelets: 200 10*3/uL (ref 150.0–400.0)
RBC: 4.96 Mil/uL (ref 3.87–5.11)
RDW: 14.9 % (ref 11.5–15.5)
WBC: 6.5 10*3/uL (ref 4.0–10.5)

## 2024-01-04 LAB — HEMOGLOBIN A1C: Hgb A1c MFr Bld: 6.3 % (ref 4.6–6.5)

## 2024-01-11 ENCOUNTER — Ambulatory Visit (INDEPENDENT_AMBULATORY_CARE_PROVIDER_SITE_OTHER): Admitting: Family Medicine

## 2024-01-11 ENCOUNTER — Encounter: Payer: Self-pay | Admitting: Family Medicine

## 2024-01-11 VITALS — BP 120/80 | HR 67 | Temp 98.4°F | Ht 60.5 in | Wt 202.1 lb

## 2024-01-11 DIAGNOSIS — E66812 Obesity, class 2: Secondary | ICD-10-CM | POA: Diagnosis not present

## 2024-01-11 DIAGNOSIS — I2510 Atherosclerotic heart disease of native coronary artery without angina pectoris: Secondary | ICD-10-CM

## 2024-01-11 DIAGNOSIS — M810 Age-related osteoporosis without current pathological fracture: Secondary | ICD-10-CM | POA: Diagnosis not present

## 2024-01-11 DIAGNOSIS — Z0000 Encounter for general adult medical examination without abnormal findings: Secondary | ICD-10-CM

## 2024-01-11 DIAGNOSIS — Z6838 Body mass index (BMI) 38.0-38.9, adult: Secondary | ICD-10-CM | POA: Diagnosis not present

## 2024-01-11 DIAGNOSIS — E782 Mixed hyperlipidemia: Secondary | ICD-10-CM

## 2024-01-11 DIAGNOSIS — E063 Autoimmune thyroiditis: Secondary | ICD-10-CM | POA: Diagnosis not present

## 2024-01-11 NOTE — Assessment & Plan Note (Addendum)
 Per pt 2007.SABRA attempted stents but unable to place them without increased risk.  Followed by Cardiology Dr. Barbaraann.   At last OV cardiology recommended considering  GLP1 for CAD prevention.  We discussed this in detail today... she will check with ENDO about family history of thyroid  cancer and if this med is okay for her to use.

## 2024-01-11 NOTE — Assessment & Plan Note (Addendum)
 CAD LDL at goal < 55 with CAD history.. on atorvastatin  10 mg daily On coQ10 for statin SE.

## 2024-01-11 NOTE — Assessment & Plan Note (Signed)
Stable, chronic.  Continue current medication.   S/P partial thyroidectomy 2009  Levo 100 mcg daily 

## 2024-01-11 NOTE — Patient Instructions (Addendum)
 Please call the location of your choice from the menu below to schedule your Mammogram and/or Bone Density appointment.    [facility]   [facility]                      Phone:  [phone] [address]                               [place], [place] [address]                                                             Services: Traditional and 3D Mammogram, Bone Density   [facility]                 Phone: [phone] [address]                                                       [place], [place] [address]    Service: Bone Density ONLY   *this site does NOT perform mammograms  [facility]                        Phone:  [phone] [address]                                  [place], [place] [address]                                            Services:  3D Mammogram and Bone Density    [facility]   Phone:  [phone]   [address]                                                                            [place], [place] [address]                                            Services: 3D Mammogram and Bone [facility])  Phone:  [phone]   [address]                        [place], [place] [address]  Services:  3D Mammogram and Bone Density

## 2024-01-11 NOTE — Progress Notes (Signed)
 Patient ID: Samantha Perkins, female    DOB: Jun 03, 1951, 73 y.o.   MRN: [id number]  This visit was conducted in person.  BP 120/80   Pulse 67   Temp 98.4 F (36.9 C) (Temporal)   Ht 5' 0.5 (1.537 m)   Wt 202 lb 2 oz (91.7 kg)   SpO2 98%   BMI 38.83 kg/m    CC:  Chief Complaint  Patient presents with   Annual Exam    Part 2 [facility] 12/27/2023)    Subjective:     HPI: [name] is a 73 y.o. female presenting on 01/11/2024 for Annual Exam (Part 2 [facility] 12/27/2023))  The patient presents for complete physical and review of chronic health problems. He/She also has the following acute concerns today: none.  Shoulder replacement 07/2023.. complete PT.  Has bilateral hip bursitis. Restarted melatonin to help with sleep.  The patient saw a LPN or RN for medicare wellness visit. 12/27/2023  Prevention and wellness was reviewed in detail. Note reviewed and important notes copied below.  TAH years ago.. for fibroids. .  hx of  cervical dysplasia  Has been able to wean off estradiol .   CAD.. followed by Dr. Barbaraann, Cardiology reviewed 09/2023  PAC, palpitations earlier in year had an episode of heart racing. Took an extra dose of metoprolol  and symptoms improved    Hypothyroidism: Followed by ENDO. Lab Results  Component Value Date   TSH 0.67 08/01/2023    Elevated Cholesterol:  CAD LDL at goal < 55.. on atorvastatin  10 mg daily... lowered given SE to 20 mg. Lab Results  Component Value Date   CHOL 139 11/06/2023   HDL 73 11/06/2023   LDLCALC 48 11/06/2023   LDLDIRECT 91 09/06/2020   TRIG 99 11/06/2023   CHOLHDL 1.9 11/06/2023  Using medications without problems: Muscle aches: yes Diet compliance: heart healthy diet Exercise:none recently.. usually at [facility]. Other complaints: Having trouble losing weight. Wt Readings from Last 3 Encounters:  01/11/24 202 lb 2 oz (91.7 kg)  12/27/23 195 lb (88.5 kg)  09/24/23 199 lb (90.3 kg)    Relevant past medical,  surgical, family and social history reviewed and updated as indicated. Interim medical history since our last visit reviewed. Allergies and medications reviewed and updated. Outpatient Medications Prior to Visit  Medication Sig Dispense Refill   acetaminophen  (TYLENOL ) 500 MG tablet Take 1,000 mg by mouth every 6 (six) hours as needed.     aspirin  EC 81 MG tablet Take 81 mg by mouth daily. Swallow whole.     atorvastatin  (LIPITOR) 10 MG tablet Take 1 tablet (10 mg total) by mouth daily. 90 tablet 3   Calcium  Carb-Cholecalciferol (CALCIUM  600 + D PO) Take 1 tablet by mouth daily.     cetirizine  (ZYRTEC ) 10 MG tablet Take 10 mg by mouth daily as needed for allergies.     clobetasol (TEMOVATE) 0.05 % GEL Apply 1 application  topically 2 (two) times daily as needed (Unknown).     Coenzyme Q10 (COQ-10) 200 MG CAPS Take 200 mg by mouth daily at 6 (six) AM.     desonide (DESOWEN) 0.05 % cream Apply 1 Application topically daily as needed (Skin).     diclofenac Sodium (VOLTAREN) 1 % GEL Apply 2 g topically daily as needed (pain).     Docosanol (ABREVA EX) Apply 1 Application topically 3 (three) times daily as needed (Fever blister).     ezetimibe  (ZETIA ) 10 MG tablet Take 1 tablet by  mouth once daily 90 tablet 1   Ketotifen Fumarate (REFRESH EYE ITCH RELIEF OP) Place 1 drop into both eyes daily as needed (Dry eyes).     L-FORMULA LYSINE HCL PO Take 500 mg by mouth daily.     Lactobacillus-Inulin (CULTURELLE ADULT ULT BALANCE PO) Take 1 tablet by mouth daily.     levothyroxine  (SYNTHROID ) 100 MCG tablet Take 1 tablet (100 mcg total) by mouth daily. 90 tablet 3   melatonin 5 MG TABS Take 5 mg by mouth at bedtime as needed (Sleep).     methocarbamol (ROBAXIN) 500 MG tablet Take 500 mg by mouth every 6 (six) hours as needed.     metoprolol  succinate (TOPROL -XL) 25 MG 24 hr tablet Take 1 tablet by mouth once daily 90 tablet 3   Multiple Vitamins-Minerals (ONE-A-DAY WOMENS 50+ ADVANTAGE PO) Take by mouth  daily.     nitroGLYCERIN  (NITROSTAT ) 0.4 MG SL tablet PLACE 1 TABLET UNDER THE TONGUE AS NEEDED 25 tablet 11   OVER THE COUNTER MEDICATION Natrol Melatonin 3Gummy 3 mg to take 1 gummy at bedtime     PROLIA  60 MG/ML SOSY injection INJECT 60MG  INTO THE SKIN ONCE FOR 1 DOSE 1 mL 0   sodium chloride  (OCEAN) 0.65 % SOLN nasal spray Place 1 spray into both nostrils daily as needed for congestion.     valACYclovir  (VALTREX ) 1000 MG tablet Take 1,000 mg by mouth daily as needed (Cold Sores).     Vitafusion Fiber Well 2.5 g CHEW Chew 1 tablet by mouth daily.     valACYclovir  (VALTREX ) 1000 MG tablet TAKE 2 TABLETS BY MOUTH TWICE DAILY FOR 1 DAY 20 tablet 0   ondansetron  (ZOFRAN -ODT) 4 MG disintegrating tablet Take 1 tablet (4 mg total) by mouth every 8 (eight) hours as needed for nausea or vomiting. 20 tablet 0   Facility-Administered Medications Prior to Visit  Medication Dose Route Frequency Provider Last Rate Last Admin   denosumab  (PROLIA ) injection 60 mg  60 mg Subcutaneous Once Orli Degrave E, MD       denosumab  (PROLIA ) injection 60 mg  60 mg Subcutaneous Once Jocee Kissick E, MD       denosumab  (PROLIA ) injection 60 mg  60 mg Subcutaneous Q6 months Alvine Mostafa E, MD       [START ON 05/30/2024] denosumab  (PROLIA ) injection 60 mg  60 mg Subcutaneous Once Beatriz Settles E, MD        History obtained from  Per HPI unless specifically indicated in ROS section below Review of Systems  Constitutional:  Negative for fatigue and fever.  HENT:  Negative for congestion.   Eyes:  Negative for pain.  Respiratory:  Negative for cough and shortness of breath.   Cardiovascular:  Negative for chest pain, palpitations and leg swelling.  Gastrointestinal:  Negative for abdominal pain.  Genitourinary:  Negative for dysuria and vaginal bleeding.  Musculoskeletal:  Negative for back pain.  Neurological:  Negative for syncope, light-headedness and headaches.  Psychiatric/Behavioral:  Negative for dysphoric mood.     Objective:  BP 120/80   Pulse 67   Temp 98.4 F (36.9 C) (Temporal)   Ht 5' 0.5 (1.537 m)   Wt 202 lb 2 oz (91.7 kg)   SpO2 98%   BMI 38.83 kg/m   Wt Readings from Last 3 Encounters:  01/11/24 202 lb 2 oz (91.7 kg)  12/27/23 195 lb (88.5 kg)  09/24/23 199 lb (90.3 kg)      Physical Exam Vitals and nursing  note reviewed. Exam conducted with a chaperone present.  Constitutional:      General: She is not in acute distress.    Appearance: Normal appearance. She is well-developed. She is not ill-appearing or toxic-appearing.  HENT:     Head: Normocephalic.     Right Ear: Hearing, tympanic membrane, ear canal and external ear normal.     Left Ear: Hearing, tympanic membrane, ear canal and external ear normal.     Nose: Nose normal.   Eyes:     General: Lids are normal. Lids are everted, no foreign bodies appreciated.     Conjunctiva/sclera: Conjunctivae normal.     Pupils: Pupils are equal, round, and reactive to light.   Neck:     Thyroid : No thyroid  mass or thyromegaly.     Vascular: No carotid bruit.     Trachea: Trachea normal.   Cardiovascular:     Rate and Rhythm: Normal rate and regular rhythm.     Heart sounds: Normal heart sounds, S1 normal and S2 normal. No murmur heard.    No gallop.  Pulmonary:     Effort: Pulmonary effort is normal. No respiratory distress.     Breath sounds: Normal breath sounds. No wheezing, rhonchi or rales.  Abdominal:     General: Bowel sounds are normal. There is no distension or abdominal bruit.     Palpations: Abdomen is soft. There is no fluid wave or mass.     Tenderness: There is no abdominal tenderness. There is no guarding or rebound.     Hernia: No hernia is present.  Genitourinary:    Labia:        Right: No rash or tenderness.        Left: No rash or tenderness.      Urethra: No prolapse, urethral pain, urethral swelling or urethral lesion.     Vagina: Normal.     Comments:  Grade 1 rectocele noted  Musculoskeletal:      Cervical back: Normal range of motion and neck supple.  Lymphadenopathy:     Cervical: No cervical adenopathy.   Skin:    General: Skin is warm and dry.     Findings: No rash.   Neurological:     Mental Status: She is alert.     Cranial Nerves: No cranial nerve deficit.     Sensory: No sensory deficit.   Psychiatric:        Mood and Affect: Mood is not anxious or depressed.        Speech: Speech normal.        Behavior: Behavior normal. Behavior is cooperative.        Judgment: Judgment normal.       Results for orders placed or performed in visit on 01/04/24  CBC   Collection Time: 01/04/24  8:53 AM  Result Value Ref Range   WBC 6.5 4.0 - 10.5 K/uL   RBC 4.96 3.87 - 5.11 Mil/uL   Platelets 200.0 150.0 - 400.0 K/uL   Hemoglobin 12.9 12.0 - 15.0 g/dL   HCT 60.2 63.9 - 53.9 %   MCV 80.1 78.0 - 100.0 fl   MCHC 32.4 30.0 - 36.0 g/dL   RDW 85.0 88.4 - 84.4 %  Hemoglobin A1c   Collection Time: 01/04/24  8:53 AM  Result Value Ref Range   Hgb A1c MFr Bld 6.3 4.6 - 6.5 %     COVID 19 screen:  No recent travel or known exposure to COVID19 The patient denies  respiratory symptoms of COVID 19 at this time. The importance of social distancing was discussed today.   Assessment and Plan The patient's preventative maintenance and recommended screening tests for an annual wellness exam were reviewed in full today. Brought up to date unless services declined.  Counselled on the importance of diet, exercise, and its role in overall health and mortality. The patient's FH and SH was reviewed, including their home life, tobacco status, and drug and alcohol status.   Vaccines: COVID-19 vaccine x 6, flu vaccine April 19, 2022, Tdap 2020, completed pneumonia and Shingrix series Pap/DVE: Status post total hysterectomy Mammo: August 19. 2025 Bone Density: August 2023 on Prolia  ( has been on 5 years),  DUE Colon: last colonoscopy 10/26/2014 in [place] Smoking Status: Non-smoker ETOH/  drug use: None/none  Hep C: Done  HIV screen:      Problem List Items Addressed This Visit     CAD (coronary artery disease)    Per pt 2007.SABRA attempted stents but unable to place them without increased risk.  Followed by Cardiology Dr. Barbaraann.   At last OV cardiology recommended considering  GLP1 for CAD prevention.  We discussed this in detail today... she will check with ENDO about family history of thyroid  cancer and if this med is okay for her to use.      Hyperlipidemia    CAD LDL at goal < 55 with CAD history.. on atorvastatin  10 mg daily On coQ10 for statin SE.      Hypothyroidism due to Hashimoto's thyroiditis   Stable, chronic.  Continue current medication.   S/P partial thyroidectomy 2009  Levo 100 mcg daily      Other Visit Diagnoses       Routine general medical examination at a health care facility    -  Primary     Class 2 severe obesity due to excess calories with serious comorbidity and body mass index (BMI) of 38.0 to 38.9 in adult [facility])         Age related osteoporosis, unspecified pathological fracture presence       Relevant Orders   DG Bone Density        Greig Ring, MD

## 2024-01-14 ENCOUNTER — Other Ambulatory Visit: Payer: Self-pay | Admitting: Family Medicine

## 2024-01-14 DIAGNOSIS — Z1231 Encounter for screening mammogram for malignant neoplasm of breast: Secondary | ICD-10-CM

## 2024-01-15 ENCOUNTER — Other Ambulatory Visit (HOSPITAL_COMMUNITY): Payer: Self-pay

## 2024-01-15 DIAGNOSIS — M542 Cervicalgia: Secondary | ICD-10-CM | POA: Diagnosis not present

## 2024-01-21 ENCOUNTER — Telehealth: Payer: Self-pay | Admitting: Family Medicine

## 2024-01-21 DIAGNOSIS — M542 Cervicalgia: Secondary | ICD-10-CM | POA: Diagnosis not present

## 2024-01-21 NOTE — Telephone Encounter (Signed)
 Patient states that for Prolia  she gets it from the   [employer] [facility] [address] [place], [place] - [address] Phone: [phone]  Fax: [phone]     And pays around $90, states she is a [employer] plus member which gets her a deeper discount

## 2024-01-24 DIAGNOSIS — M542 Cervicalgia: Secondary | ICD-10-CM | POA: Diagnosis not present

## 2024-01-28 DIAGNOSIS — M542 Cervicalgia: Secondary | ICD-10-CM | POA: Diagnosis not present

## 2024-01-30 ENCOUNTER — Other Ambulatory Visit: Payer: Self-pay | Admitting: Family Medicine

## 2024-01-30 DIAGNOSIS — M8000XS Age-related osteoporosis with current pathological fracture, unspecified site, sequela: Secondary | ICD-10-CM

## 2024-01-31 DIAGNOSIS — M542 Cervicalgia: Secondary | ICD-10-CM | POA: Diagnosis not present

## 2024-02-12 DIAGNOSIS — M542 Cervicalgia: Secondary | ICD-10-CM | POA: Diagnosis not present

## 2024-02-12 DIAGNOSIS — Z96611 Presence of right artificial shoulder joint: Secondary | ICD-10-CM | POA: Diagnosis not present

## 2024-02-18 DIAGNOSIS — M542 Cervicalgia: Secondary | ICD-10-CM | POA: Diagnosis not present

## 2024-02-19 DIAGNOSIS — M542 Cervicalgia: Secondary | ICD-10-CM | POA: Diagnosis not present

## 2024-02-22 ENCOUNTER — Ambulatory Visit

## 2024-03-04 DIAGNOSIS — M5440 Lumbago with sciatica, unspecified side: Secondary | ICD-10-CM | POA: Diagnosis not present

## 2024-03-04 DIAGNOSIS — M542 Cervicalgia: Secondary | ICD-10-CM | POA: Diagnosis not present

## 2024-03-04 DIAGNOSIS — M5412 Radiculopathy, cervical region: Secondary | ICD-10-CM | POA: Diagnosis not present

## 2024-03-05 ENCOUNTER — Ambulatory Visit
Admission: RE | Admit: 2024-03-05 | Discharge: 2024-03-05 | Disposition: A | Discharge status: Home / Self Care | Source: Ambulatory Visit | Attending: Family Medicine | Admitting: Family Medicine

## 2024-03-05 DIAGNOSIS — Z1231 Encounter for screening mammogram for malignant neoplasm of breast: Secondary | ICD-10-CM | POA: Diagnosis not present

## 2024-03-07 ENCOUNTER — Ambulatory Visit: Payer: Self-pay | Admitting: Family Medicine

## 2024-03-12 ENCOUNTER — Ambulatory Visit

## 2024-03-12 DIAGNOSIS — M810 Age-related osteoporosis without current pathological fracture: Secondary | ICD-10-CM | POA: Diagnosis not present

## 2024-03-12 DIAGNOSIS — M85851 Other specified disorders of bone density and structure, right thigh: Secondary | ICD-10-CM | POA: Diagnosis not present

## 2024-03-12 DIAGNOSIS — Z780 Asymptomatic menopausal state: Secondary | ICD-10-CM | POA: Diagnosis not present

## 2024-03-13 ENCOUNTER — Ambulatory Visit: Payer: Self-pay | Admitting: Family Medicine

## 2024-03-13 DIAGNOSIS — M5126 Other intervertebral disc displacement, lumbar region: Secondary | ICD-10-CM | POA: Diagnosis not present

## 2024-03-13 DIAGNOSIS — M48061 Spinal stenosis, lumbar region without neurogenic claudication: Secondary | ICD-10-CM | POA: Diagnosis not present

## 2024-03-13 DIAGNOSIS — M5136 Other intervertebral disc degeneration, lumbar region with discogenic back pain only: Secondary | ICD-10-CM | POA: Diagnosis not present

## 2024-03-13 DIAGNOSIS — M5440 Lumbago with sciatica, unspecified side: Secondary | ICD-10-CM | POA: Diagnosis not present

## 2024-03-13 DIAGNOSIS — M47816 Spondylosis without myelopathy or radiculopathy, lumbar region: Secondary | ICD-10-CM | POA: Diagnosis not present

## 2024-03-27 DIAGNOSIS — M47812 Spondylosis without myelopathy or radiculopathy, cervical region: Secondary | ICD-10-CM | POA: Diagnosis not present

## 2024-04-14 DIAGNOSIS — M47812 Spondylosis without myelopathy or radiculopathy, cervical region: Secondary | ICD-10-CM | POA: Diagnosis not present

## 2024-04-15 DIAGNOSIS — M5440 Lumbago with sciatica, unspecified side: Secondary | ICD-10-CM | POA: Diagnosis not present

## 2024-04-15 DIAGNOSIS — M47812 Spondylosis without myelopathy or radiculopathy, cervical region: Secondary | ICD-10-CM | POA: Diagnosis not present

## 2024-04-15 DIAGNOSIS — Z6838 Body mass index (BMI) 38.0-38.9, adult: Secondary | ICD-10-CM | POA: Diagnosis not present

## 2024-04-20 ENCOUNTER — Inpatient Hospital Stay: Admission: RE | Admit: 2024-04-20 | Discharge: 2024-04-20 | Attending: Emergency Medicine

## 2024-04-20 VITALS — BP 104/71 | HR 84 | Temp 98.4°F | Resp 18

## 2024-04-20 DIAGNOSIS — J069 Acute upper respiratory infection, unspecified: Secondary | ICD-10-CM | POA: Diagnosis not present

## 2024-04-20 LAB — POC SOFIA SARS ANTIGEN FIA: SARS Coronavirus 2 Ag: NEGATIVE

## 2024-04-20 NOTE — ED Provider Notes (Signed)
 CAY RALPH PELT    CSN: [id number] Arrival date & time: 04/20/24  1005      History   Chief Complaint Chief Complaint  Patient presents with   Nasal Congestion   Cough    HPI Samantha Perkins is a 73 y.o. female.  Patient presents with 3-day history of runny nose, nasal congestion, sore throat, cough, fatigue.  No OTC medications taken today but previously treating her symptoms with Tylenol  and Chloraseptic spray.  No fever, chest pain, shortness of breath, vomiting, diarrhea.  The history is provided by the patient and medical records.    Past Medical History:  Diagnosis Date   Arthritis    Carotid artery occlusion    Complication of anesthesia    trouble waking up and her O2 goes low   Coronary artery disease    Distal LCX MI 2007 in [place]. PCI unable to be performed.    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   Postoperative hypothyroidism 08/01/2023   Rectocele 12/26/2022   CAD (coronary artery disease) 06/28/2022   History of palpitations 06/28/2022   Hashimoto's disease 04/27/2022   Hormone replacement therapy 03/22/2022   Severe obesity with body mass index (BMI) of 35.0 to 39.9 with comorbidity (HCC) 09/15/2021   History of lobectomy of thyroid  09/17/2020   Age-related osteoporosis with current pathological fracture 09/17/2020   History of MI (myocardial infarction) 07/26/2020   Thyroid  nodule 07/26/2020   Hypothyroidism due to Hashimoto's thyroiditis 07/26/2020   Hyperlipidemia 07/26/2020    Past Surgical History:  Procedure Laterality Date   ABDOMINAL HYSTERECTOMY  1991   APPENDECTOMY  1977   BILATERAL OOPHORECTOMY  1991   CARDIAC CATHETERIZATION     CATARACT EXTRACTION, BILATERAL     GUM SURGERY      KNEE ARTHROSCOPY Right 11/08/2017   LASIK Bilateral    OVARIAN CYST REMOVAL     REVERSE SHOULDER ARTHROPLASTY Right 08/10/2023   Procedure: REVERSE SHOULDER ARTHROPLASTY;  Surgeon: Sharl Selinda Dover, MD;  Location: [facility];  Service: Orthopedics;  Laterality: Right;   ROTATOR CUFF REPAIR Right 1982   ROTATOR CUFF REPAIR W/ DISTAL CLAVICLE EXCISION Left 07/05/2018   THYROID  SURGERY  12/12/2007   TUBAL LIGATION     WRIST SURGERY Left     OB History   No obstetric history on file.      Home Medications    Prior to Admission medications   Medication Sig Start Date End Date Taking? Authorizing Provider  acetaminophen  (TYLENOL ) 500 MG tablet Take 1,000 mg by mouth every 6 (six) hours as needed.    [provider]  aspirin  EC 81 MG tablet Take 81 mg by mouth daily. Swallow whole.    [provider]  atorvastatin  (LIPITOR) 10 MG tablet Take 1 tablet (10 mg total) by mouth daily. 09/24/23   Rana Lum CROME, NP  Calcium  Carb-Cholecalciferol (CALCIUM  600 + D PO) Take 1 tablet by mouth daily.    [provider]  cetirizine  (ZYRTEC ) 10 MG tablet Take 10 mg by mouth daily as needed for allergies.    [provider]  clobetasol (TEMOVATE) 0.05 % GEL Apply 1 application  topically 2 (two) times daily as needed (Unknown). 12/01/20  [provider]  Coenzyme Q10 (COQ-10) 200 MG CAPS Take 200 mg by mouth daily at 6 (six) AM.    [provider]  desonide (DESOWEN) 0.05 % cream Apply 1 Application topically daily as needed (Skin). 02/25/20   [provider]  diclofenac Sodium (VOLTAREN) 1 % GEL Apply 2 g topically daily as needed (pain).    [provider]  Docosanol (ABREVA EX) Apply 1 Application topically 3 (three) times daily as needed (Fever blister).    [provider]  ezetimibe  (ZETIA ) 10 MG tablet Take 1 tablet by mouth once daily 11/21/23   O'Neal, Darryle Ned, MD  Ketotifen Fumarate (REFRESH EYE [facility]  RELIEF OP) Place 1 drop into both eyes daily as needed (Dry eyes).    [provider]  L-FORMULA LYSINE HCL PO Take 500 mg by mouth daily.    [provider]  Lactobacillus-Inulin (CULTURELLE ADULT ULT BALANCE PO) Take 1 tablet by mouth daily.    [provider]  levothyroxine  (SYNTHROID ) 100 MCG tablet Take 1 tablet (100 mcg total) by mouth daily. 08/02/23   Shamleffer, Ibtehal Jaralla, MD  melatonin 5 MG TABS Take 5 mg by mouth at bedtime as needed (Sleep).    [provider]  methocarbamol (ROBAXIN) 500 MG tablet Take 500 mg by mouth every 6 (six) hours as needed. 09/18/23   [provider]  metoprolol  succinate (TOPROL -XL) 25 MG 24 hr tablet Take 1 tablet by mouth once daily 01/04/24   Fountain, Madison L, NP  Multiple Vitamins-Minerals (ONE-A-DAY WOMENS 50+ ADVANTAGE PO) Take by mouth daily.    [provider]  nitroGLYCERIN  (NITROSTAT ) 0.4 MG SL tablet PLACE 1 TABLET UNDER THE TONGUE AS NEEDED 10/30/23   O'Neal, Darryle Ned, MD  OVER THE COUNTER MEDICATION Natrol Melatonin 3Gummy 3 mg to take 1 gummy at bedtime    [provider]  PROLIA  60 MG/ML SOSY injection INJECT 60MG  INTO THE SKIN ONCE FOR 1 DOSE 05/11/23   Bedsole, Amy E, MD  sodium chloride  (OCEAN) 0.65 % SOLN nasal spray Place 1 spray into both nostrils daily as needed for congestion.    [provider]  valACYclovir  (VALTREX ) 1000 MG tablet Take 1,000 mg by mouth daily as needed (Cold Sores).    [provider]  Vitafusion Fiber Well 2.5 g CHEW Chew 1 tablet by mouth daily.    [provider]    Family History Family History  Problem Relation Age of Onset   Heart failure Mother    Hypothyroidism Mother    Aneurysm Mother    Stroke Mother    Mitral valve prolapse Mother    Lung cancer Mother        smoker   Arthritis Father    Heart disease Father    Heart murmur Father    Diabetes Father    Hypertension Father    COPD Father     Diabetes Sister    Bladder Cancer Sister    Cataracts Sister    Arthritis Sister    Osteopenia Sister    Irritable bowel syndrome Sister    Mitral valve prolapse Sister    Diabetes Sister    Fibroids Sister    Lung cancer Sister        smoker   Mitral valve prolapse Sister    Hypertension Sister    Bradycardia Sister        w/ pacemaker   Crohn's disease Sister    Stroke Sister    Liver  cancer Sister    Colon cancer Maternal Uncle        3 uncles    Pancreatic cancer Maternal Uncle        2 uncles with both colon and pancreatic   Diabetes Maternal Grandmother    Breast cancer Neg Hx    BRCA 1/2 Neg Hx     Social History Social History   Tobacco Use   Smoking status: Never   Smokeless tobacco: Never  Vaping Use   Vaping status: Never Used  Substance Use Topics   Alcohol use: Yes    Comment: once every two weeks a glass of wine   Drug use: Never     Allergies   Cymbalta [duloxetine hcl], Tramadol, Duloxetine, Latex, Wound dressing adhesive, and Other   Review of Systems Review of Systems  Constitutional:  Positive for fatigue. Negative for chills and fever.  HENT:  Positive for congestion, rhinorrhea and sore throat. Negative for ear pain.   Respiratory:  Positive for cough. Negative for shortness of breath.   Cardiovascular:  Negative for chest pain and palpitations.  Gastrointestinal:  Negative for diarrhea and vomiting.     Physical Exam Triage Vital Signs ED Triage Vitals  Encounter Vitals Group     BP 04/20/24 1014 104/71     Girls Systolic BP Percentile --      Girls Diastolic BP Percentile --      Boys Systolic BP Percentile --      Boys Diastolic BP Percentile --      Pulse Rate 04/20/24 1014 84     Resp 04/20/24 1014 18     Temp 04/20/24 1014 98.4 F (36.9 C)     Temp src --      SpO2 04/20/24 1014 98 %     Weight --      Height --      Head Circumference --      Peak Flow --      Pain Score 04/20/24 1013 6     Pain Loc --      Pain  Education --      Exclude from Growth Chart --    No data found.  Updated Vital Signs BP 104/71   Pulse 84   Temp 98.4 F (36.9 C)   Resp 18   SpO2 98%   Visual Acuity Right Eye Distance:   Left Eye Distance:   Bilateral Distance:    Right Eye Near:   Left Eye Near:    Bilateral Near:     Physical Exam Constitutional:      General: She is not in acute distress. HENT:     Right Ear: Tympanic membrane normal.     Left Ear: Tympanic membrane normal.     Nose: Rhinorrhea present.     Mouth/Throat:     Mouth: Mucous membranes are moist.     Pharynx: Oropharynx is clear.  Cardiovascular:     Rate and Rhythm: Normal rate and regular rhythm.     Heart sounds: Normal heart sounds.  Pulmonary:     Effort: Pulmonary effort is normal. No respiratory distress.     Breath sounds: Normal breath sounds.  Neurological:     Mental Status: She is alert.      UC Treatments / Results  Labs (all labs ordered are listed, but only abnormal results are displayed) Labs Reviewed  POC SOFIA SARS ANTIGEN FIA - Normal    EKG   Radiology No results found.  Procedures Procedures (including critical care time)  Medications Ordered in UC Medications - No data to display  Initial Impression / Assessment and Plan / UC Course  I have reviewed the triage vital signs and the nursing notes.  Pertinent labs & imaging results that were available during my care of the patient were reviewed by me and considered in my medical decision making (see chart for details).    Viral URI.  Afebrile and vital signs are stable.  Lungs are clear and O2 sat is 98% on room air.  COVID-negative.  Discussed symptomatic treatment including Tylenol  as needed, plain Mucinex  as needed, rest, hydration.  Education provided on viral respiratory infection.  Instructed patient to follow-up with her PCP if she is not improving.  ED precautions given.  She agrees to plan of care.  Final Clinical Impressions(s) / UC  Diagnoses   Final diagnoses:  Viral URI     Discharge Instructions      The COVID test is negative.   Take Tylenol  as needed for fever or discomfort.  Take plain Mucinex  as needed for congestion.  Rest and keep yourself hydrated.    Follow-up with your primary care provider if your symptoms are not improving.         ED Prescriptions   None    PDMP not reviewed this encounter.   Corlis Burnard DEL, NP 04/20/24 1047

## 2024-04-20 NOTE — ED Triage Notes (Signed)
 Patient to Urgent Care with complaints of sore throat/ nasal congestion/ runny nose/ cough/ fatigue.   Symptoms started Thursday (worse yesterday). Recent travel.   Meds: tylenol / delsym/ mucinex / water/ cough drops/.

## 2024-04-20 NOTE — Discharge Instructions (Addendum)
 The COVID test is negative.   Take Tylenol  as needed for fever or discomfort.  Take plain Mucinex  as needed for congestion.  Rest and keep yourself hydrated.    Follow-up with your primary care provider if your symptoms are not improving.

## 2024-04-29 ENCOUNTER — Telehealth: Payer: Self-pay

## 2024-04-29 NOTE — Telephone Encounter (Signed)
 Prolia  VOB initiated via [email or web address]  Next Prolia  inj DUE: 05/30/24

## 2024-04-30 ENCOUNTER — Other Ambulatory Visit (HOSPITAL_COMMUNITY): Payer: Self-pay

## 2024-04-30 DIAGNOSIS — M5440 Lumbago with sciatica, unspecified side: Secondary | ICD-10-CM | POA: Diagnosis not present

## 2024-04-30 DIAGNOSIS — M47812 Spondylosis without myelopathy or radiculopathy, cervical region: Secondary | ICD-10-CM | POA: Diagnosis not present

## 2024-04-30 NOTE — Telephone Encounter (Signed)
 Samantha Perkins

## 2024-04-30 NOTE — Telephone Encounter (Signed)
 Pt ready for scheduling for PROLIA  on or after : 05/30/24  Option# 1: Buy/Bill (Office supplied medication)  Out-of-pocket cost due at time of clinic visit: $347  Number of injection/visits approved: 2  Primary: [employer] Prolia  co-insurance: 20% Admin fee co-insurance: $15  Secondary: --- Prolia  co-insurance:  Admin fee co-insurance:   Medical Benefit Details: Date Benefits were checked: 04/29/24 Deductible: NO/ Coinsurance: 20%/ Admin Fee: $15  Prior Auth: APPROVED PA# [id number] Expiration Date: 09/08/20-07/16/24  # of doses approved: 2 ----------------------------------------------------------------------- Option# 2- Med Obtained from pharmacy:  Pharmacy benefit: Copay $909.59 (Paid to pharmacy) Admin Fee: $15 (Pay at clinic)  Prior Auth: N/A PA# Expiration Date:   # of doses approved:   If patient wants fill through the pharmacy benefit please send prescription to: [facility], and include estimated need by date in rx notes. Pharmacy will ship medication directly to the office.  Patient NOT eligible for Prolia  Copay Card. Copay Card can make patient's cost as little as $25. Link to apply: [email or web address]://[email or web address]  ** This summary of benefits is an estimation of the patient's out-of-pocket cost. Exact cost may very based on individual plan coverage.

## 2024-05-01 DIAGNOSIS — L720 Epidermal cyst: Secondary | ICD-10-CM | POA: Diagnosis not present

## 2024-05-01 DIAGNOSIS — I788 Other diseases of capillaries: Secondary | ICD-10-CM | POA: Diagnosis not present

## 2024-05-01 DIAGNOSIS — L821 Other seborrheic keratosis: Secondary | ICD-10-CM | POA: Diagnosis not present

## 2024-05-01 DIAGNOSIS — D1801 Hemangioma of skin and subcutaneous tissue: Secondary | ICD-10-CM | POA: Diagnosis not present

## 2024-05-06 DIAGNOSIS — M47812 Spondylosis without myelopathy or radiculopathy, cervical region: Secondary | ICD-10-CM | POA: Diagnosis not present

## 2024-05-06 DIAGNOSIS — M5440 Lumbago with sciatica, unspecified side: Secondary | ICD-10-CM | POA: Diagnosis not present

## 2024-05-09 DIAGNOSIS — M47812 Spondylosis without myelopathy or radiculopathy, cervical region: Secondary | ICD-10-CM | POA: Diagnosis not present

## 2024-05-09 DIAGNOSIS — M5440 Lumbago with sciatica, unspecified side: Secondary | ICD-10-CM | POA: Diagnosis not present

## 2024-05-13 DIAGNOSIS — M5440 Lumbago with sciatica, unspecified side: Secondary | ICD-10-CM | POA: Diagnosis not present

## 2024-05-13 DIAGNOSIS — M47812 Spondylosis without myelopathy or radiculopathy, cervical region: Secondary | ICD-10-CM | POA: Diagnosis not present

## 2024-05-14 ENCOUNTER — Telehealth: Payer: Self-pay | Admitting: Family Medicine

## 2024-05-14 NOTE — Telephone Encounter (Signed)
 Spoke with Samantha Perkins.  She states she is due for her Prolia  in November.  She states last injection she was told she would only be responsible for her $15 co-pay but ended up getting a bill for $300.  Dr. Avelina eventually got this charge taken care of.  Patient states she can get the Prolia  from [facility] for $90 if a Rx is sent there and she can pick it up and bring it to office to be administered.  I advised I would send a message to Manuelita that over sees the Prolia  to review.

## 2024-05-14 NOTE — Telephone Encounter (Signed)
:   Patient called in wanting to speak with Amy. Patient can be reached at [phone] Lawren Sexson (patients wife) its in regards of the proila shot.

## 2024-05-15 DIAGNOSIS — M5440 Lumbago with sciatica, unspecified side: Secondary | ICD-10-CM | POA: Diagnosis not present

## 2024-05-15 DIAGNOSIS — M47812 Spondylosis without myelopathy or radiculopathy, cervical region: Secondary | ICD-10-CM | POA: Diagnosis not present

## 2024-05-15 NOTE — Telephone Encounter (Signed)
 See referral

## 2024-05-15 NOTE — Telephone Encounter (Signed)
 This information was documented on a closed referral so it did not come to the [facility] referral pool to handle. This is second one that I have come across that this has happened with. I have transferred the authorization information to the correct referral but in the future referrals like this are going to be missed.

## 2024-05-15 NOTE — Telephone Encounter (Signed)
 Noted

## 2024-05-16 ENCOUNTER — Other Ambulatory Visit: Payer: Self-pay | Admitting: *Deleted

## 2024-05-16 DIAGNOSIS — M8000XS Age-related osteoporosis with current pathological fracture, unspecified site, sequela: Secondary | ICD-10-CM

## 2024-05-16 MED ORDER — DENOSUMAB 60 MG/ML ~~LOC~~ SOSY: 60.0000 mg | PREFILLED_SYRINGE | Freq: Once | SUBCUTANEOUS | 0 refills | Status: AC

## 2024-05-19 ENCOUNTER — Telehealth: Payer: Self-pay | Admitting: Family Medicine

## 2024-05-19 DIAGNOSIS — M5440 Lumbago with sciatica, unspecified side: Secondary | ICD-10-CM | POA: Diagnosis not present

## 2024-05-19 DIAGNOSIS — M47812 Spondylosis without myelopathy or radiculopathy, cervical region: Secondary | ICD-10-CM | POA: Diagnosis not present

## 2024-05-19 NOTE — Telephone Encounter (Signed)
 Copied from CRM #[id number]. Topic: General - Other >> May 19, 2024  1:32 PM Victoria A wrote: Reason for CRM: Patients wife asked for Morna to call her back please    ------------------------------ Spoke with pt. She will need to do buy and bill for prolia . Pharmacy benefit will be $900. Referral has been updated.

## 2024-05-20 NOTE — Progress Notes (Unsigned)
 Cardiology Office Note:  .   Date:  05/22/2024  ID:  Samantha Perkins, DOB [birth year], MRN [id number] PCP: Avelina Greig BRAVO, MD  [facility] Providers Cardiologist:  Darryle ONEIDA Decent, MD { History of Present Illness: .    Chief Complaint  Patient presents with   Follow-up         Samantha Perkins is a 73 y.o. female with history of CAD, HLD who presents for follow-up.    History of Present Illness   Samantha Perkins is a 73 year old female with coronary artery disease who presents for follow-up regarding palpitations and leg pain.  She experiences episodes of tachycardia, particularly during the summer months when exposed to heat. These episodes occur approximately every couple of weeks and are characterized by a heart rate exceeding 200 bpm, with one instance reaching 220 bpm. She uses a Kardia[employer] device, which has indicated an irregular rhythm during these episodes. She manages these episodes with metoprolol  and Valsalva maneuvers, which have been effective in reducing her heart rate. The last episode occurred three weeks ago, and she notes that cooler weather may have contributed to the absence of recent episodes.  She experiences shortness of breath, particularly with physical activity such as walking a block. She reports that she has not been exercising regularly since her shoulder surgery and has been feeling exhausted, with her shoulder still not fully recovered. No chest pain is reported.  She continues to experience muscle pain in her legs, which she associates with atorvastatin  use. Despite a reduction in the atorvastatin  dosage, the muscle pain persists. She describes the pain as severe and affecting both legs, extending from the thighs down to the calves.  Her current medications include metoprolol  succinate 25 mg daily, atorvastatin  10 mg daily, and ezetimibe  10 mg daily. She maintains a good hydration status.  She is married and has been experiencing stress related  to her husband's health issues, including a possible misdiagnosis of Parkinson's disease.          Problem List 1. CAD -Small inferior infarct 01/28/2006 with totally occluded LCX w/ unsuccessful angioplasty ([facility] ) -Cardiac PET 04/14/2020 ([facility] ): Large, fixed perfusion defect of the lateral wall, no ischemia, findings consistent with prior MI, EF 59% 2. HLD -T chol 110, HDL 63, LDL 27, TG 99 3. Hypothyroidism  4. Osteopenia 5.  PACs -Treated with metoprolol  6.  Carotid artery disease -40 to 59% left ICA (2020; [place] ) -no disease 09/08/2020    ROS: All other ROS reviewed and negative. Pertinent positives noted in the HPI.     Studies Reviewed: SABRA       Physical Exam:   VS:  BP 130/82 (BP Location: Left Arm, Patient Position: Sitting, Cuff Size: Large)   Pulse 79   Ht 5' 2 (1.575 m)   Wt 205 lb 3.2 oz (93.1 kg)   SpO2 97%   BMI 37.53 kg/m    Wt Readings from Last 3 Encounters:  05/22/24 205 lb 3.2 oz (93.1 kg)  01/11/24 202 lb 2 oz (91.7 kg)  12/27/23 195 lb (88.5 kg)    GEN: Well nourished, well developed in no acute distress NECK: No JVD; No carotid bruits CARDIAC: RRR, no murmurs, rubs, gallops RESPIRATORY:  Clear to auscultation without rales, wheezing or rhonchi  ABDOMEN: Soft, non-tender, non-distended EXTREMITIES:  No edema; No deformity  ASSESSMENT AND PLAN: .   Assessment and Plan    Paroxysmal supraventricular tachycardia and palpitations Episodes of tachycardia over  200 bpm, reduced frequency with cooler weather. Metoprolol  effective. No episodes in three weeks. Differential includes SVT, further monitoring needed. - Ordered two-week heart monitor. - Continue metoprolol  succinate 25 mg daily. - Ordered echocardiogram.  Atherosclerotic heart disease of native coronary artery, medically managed Small occluded circumflex vessel managed medically. Normal ejection fraction. No recent chest pain.  Shortness of breath, likely  multifactorial Shortness of breath with activity, possibly due to deconditioning and stress. - Ordered echocardiogram.  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 - Hold atorvastatin  for two weeks and monitor for improvement. - Consider non-statin medication if no improvement.  Mixed hyperlipidemia Cholesterol levels excellent in April. Currently on atorvastatin  and ezetimibe . Plan to assess impact of holding atorvastatin  on leg pain. - Hold atorvastatin  for two weeks and monitor cholesterol levels. - Continue ezetimibe  10 mg daily.  Hypertension Managed with metoprolol .  Obesity Discussed potential use of GLP-1 receptor agonists for weight management due to CAD. Benefits include weight loss, but side effects such as nausea and palpitations may occur. - Discuss GLP-1 receptor agonists with primary care provider for potential initiation.              Follow-up: Return in about 1 year (around 05/22/2025).   Signed, Darryle DASEN. Barbaraann, MD, [facility]  [facility]  [address] [place], [place] [address] [phone]  4:20 PM

## 2024-05-21 DIAGNOSIS — M47812 Spondylosis without myelopathy or radiculopathy, cervical region: Secondary | ICD-10-CM | POA: Diagnosis not present

## 2024-05-21 DIAGNOSIS — M5440 Lumbago with sciatica, unspecified side: Secondary | ICD-10-CM | POA: Diagnosis not present

## 2024-05-22 ENCOUNTER — Ambulatory Visit: Attending: Cardiovascular Disease

## 2024-05-22 ENCOUNTER — Other Ambulatory Visit: Payer: Self-pay | Admitting: Cardiovascular Disease

## 2024-05-22 ENCOUNTER — Ambulatory Visit: Attending: Cardiovascular Disease | Admitting: Cardiovascular Disease

## 2024-05-22 ENCOUNTER — Ambulatory Visit: Payer: Self-pay | Admitting: Family Medicine

## 2024-05-22 ENCOUNTER — Encounter: Payer: Self-pay | Admitting: Cardiovascular Disease

## 2024-05-22 ENCOUNTER — Other Ambulatory Visit (INDEPENDENT_AMBULATORY_CARE_PROVIDER_SITE_OTHER)

## 2024-05-22 VITALS — BP 130/82 | HR 79 | Ht 62.0 in | Wt 205.2 lb

## 2024-05-22 DIAGNOSIS — M8000XS Age-related osteoporosis with current pathological fracture, unspecified site, sequela: Secondary | ICD-10-CM | POA: Diagnosis not present

## 2024-05-22 DIAGNOSIS — M8000XA Age-related osteoporosis with current pathological fracture, unspecified site, initial encounter for fracture: Secondary | ICD-10-CM

## 2024-05-22 DIAGNOSIS — M79604 Pain in right leg: Secondary | ICD-10-CM

## 2024-05-22 DIAGNOSIS — I2510 Atherosclerotic heart disease of native coronary artery without angina pectoris: Secondary | ICD-10-CM | POA: Diagnosis not present

## 2024-05-22 DIAGNOSIS — R0602 Shortness of breath: Secondary | ICD-10-CM | POA: Diagnosis not present

## 2024-05-22 DIAGNOSIS — M79605 Pain in left leg: Secondary | ICD-10-CM

## 2024-05-22 DIAGNOSIS — E782 Mixed hyperlipidemia: Secondary | ICD-10-CM | POA: Diagnosis not present

## 2024-05-22 DIAGNOSIS — R002 Palpitations: Secondary | ICD-10-CM | POA: Diagnosis not present

## 2024-05-22 DIAGNOSIS — I471 Supraventricular tachycardia, unspecified: Secondary | ICD-10-CM

## 2024-05-22 LAB — BASIC METABOLIC PANEL WITH GFR
BUN: 15 mg/dL (ref 6–23)
CO2: 30 meq/L (ref 19–32)
Calcium: 9.1 mg/dL (ref 8.4–10.5)
Chloride: 101 meq/L (ref 96–112)
Creatinine, Ser: 0.81 mg/dL (ref 0.40–1.20)
GFR: 72.23 mL/min (ref >60.00)
Glucose, Bld: 101 mg/dL — ABNORMAL HIGH (ref 70–99)
Potassium: 4.2 meq/L (ref 3.5–5.1)
Sodium: 139 meq/L (ref 135–145)

## 2024-05-22 NOTE — Patient Instructions (Signed)
 Medication Instructions:  Your physician has recommended you make the following change in your medication:  HOLD for 2 weeks: Lipitor  Please contact office with update afterwards.  *If you need a refill on your cardiac medications before your next appointment, please call your pharmacy*  Testing/Procedures: Your physician has requested that you have an echocardiogram. Echocardiography is a painless test that uses sound waves to create images of your heart. It provides your doctor with information about the size and shape of your heart and how well your heart's chambers and valves are working. This procedure takes approximately one hour. There are no restrictions for this procedure. Please do NOT wear cologne, perfume, aftershave, or lotions (deodorant is allowed). Please arrive 15 minutes prior to your appointment time.  Please note: We ask at that you not bring children with you during ultrasound (echo/ vascular) testing. Due to room size and safety concerns, children are not allowed in the ultrasound rooms during exams. Our front office staff cannot provide observation of children in our lobby area while testing is being conducted. An adult accompanying a patient to their appointment will only be allowed in the ultrasound room at the discretion of the ultrasound technician under special circumstances. We apologize for any inconvenience.  ZIO XT- Long Term Monitor Instructions  Your physician has requested you wear a ZIO patch monitor for 14 days.  This is a single patch monitor. [employer] supplies one patch monitor per enrollment. Additional stickers are not available. Please do not apply patch if you will be having a Nuclear Stress Test,  Echocardiogram, Cardiac CT, MRI, or Chest Xray during the period you would be wearing the  monitor. The patch cannot be worn during these tests. You cannot remove and re-apply the  ZIO XT patch monitor.  Your ZIO patch monitor will be mailed 3 day USPS to  your address on file. It may take 3-5 days  to receive your monitor after you have been enrolled.  Once you have received your monitor, please review the enclosed instructions. Your monitor  has already been registered assigning a specific monitor serial # to you.  Billing and Patient Assistance Program Information  We have supplied [employer] with any of your insurance information on file for billing purposes. [employer] offers a sliding scale Patient Assistance Program for patients that do not have  insurance, or whose insurance does not completely cover the cost of the ZIO monitor.  You must apply for the Patient Assistance Program to qualify for this discounted rate.  To apply, please call [employer] at [phone], select option 4, select option 2, ask to apply for  Patient Assistance Program. Meredeth will ask your household income, and how many people  are in your household. They will quote your out-of-pocket cost based on that information.  [employer] will also be able to set up a 71-month, interest-free payment plan if needed.  Applying the monitor   Shave hair from upper left chest.  Hold abrader disc by orange tab. Rub abrader in 40 strokes over the upper left chest as  indicated in your monitor instructions.  Clean area with 4 enclosed alcohol pads. Let dry.  Apply patch as indicated in monitor instructions. Patch will be placed under collarbone on left  side of chest with arrow pointing upward.  Rub patch adhesive wings for 2 minutes. Remove white label marked 1. Remove the white  label marked 2. Rub patch adhesive wings for 2 additional minutes.  While looking in a mirror, press and  release button in center of patch. A small green light will  flash 3-4 times. This will be your only indicator that the monitor has been turned on.  Do not shower for the first 24 hours. You may shower after the first 24 hours.  Press the button if you feel a symptom. You will hear a small click. Record  Date, Time and  Symptom in the Patient Logbook.  When you are ready to remove the patch, follow instructions on the last 2 pages of Patient  Logbook. Stick patch monitor onto the last page of Patient Logbook.  Place Patient Logbook in the blue and white box. Use locking tab on box and tape box closed  securely. The blue and white box has prepaid postage on it. Please place it in the mailbox as  soon as possible. Your physician should have your test results approximately 7 days after the  monitor has been mailed back to [facility].  Call [facility] Customer Care at [phone] if you have questions regarding  your ZIO XT patch monitor. Call them immediately if you see an orange light blinking on your  monitor.  If your monitor falls off in less than 4 days, contact our Monitor department at [phone].  If your monitor becomes loose or falls off after 4 days call [employer] at [phone] for  suggestions on securing your monitor   Follow-Up: At [facility], you and your health needs are our priority.  As part of our continuing mission to provide you with exceptional heart care, our providers are all part of one team.  This team includes your primary Cardiologist (physician) and Advanced Practice Providers or APPs (Physician Assistants and Nurse Practitioners) who all work together to provide you with the care you need, when you need it.  Your next appointment:   1 year(s)  Provider:   Darryle ONEIDA Decent, MD

## 2024-05-22 NOTE — Progress Notes (Unsigned)
 Enrolled for [employer] to mail a ZIO XT long term holter monitor to the patients address on file.

## 2024-05-23 DIAGNOSIS — I25119 Atherosclerotic heart disease of native coronary artery with unspecified angina pectoris: Secondary | ICD-10-CM | POA: Diagnosis not present

## 2024-05-23 DIAGNOSIS — E785 Hyperlipidemia, unspecified: Secondary | ICD-10-CM | POA: Diagnosis not present

## 2024-05-23 DIAGNOSIS — I252 Old myocardial infarction: Secondary | ICD-10-CM | POA: Diagnosis not present

## 2024-05-23 DIAGNOSIS — Z8249 Family history of ischemic heart disease and other diseases of the circulatory system: Secondary | ICD-10-CM | POA: Diagnosis not present

## 2024-05-23 DIAGNOSIS — E049 Nontoxic goiter, unspecified: Secondary | ICD-10-CM | POA: Diagnosis not present

## 2024-05-23 DIAGNOSIS — I10 Essential (primary) hypertension: Secondary | ICD-10-CM | POA: Diagnosis not present

## 2024-05-23 DIAGNOSIS — B009 Herpesviral infection, unspecified: Secondary | ICD-10-CM | POA: Diagnosis not present

## 2024-05-23 DIAGNOSIS — Z87440 Personal history of urinary (tract) infections: Secondary | ICD-10-CM | POA: Diagnosis not present

## 2024-05-23 DIAGNOSIS — M519 Unspecified thoracic, thoracolumbar and lumbosacral intervertebral disc disorder: Secondary | ICD-10-CM | POA: Diagnosis not present

## 2024-05-23 DIAGNOSIS — E069 Thyroiditis, unspecified: Secondary | ICD-10-CM | POA: Diagnosis not present

## 2024-05-23 DIAGNOSIS — E890 Postprocedural hypothyroidism: Secondary | ICD-10-CM | POA: Diagnosis not present

## 2024-05-23 DIAGNOSIS — Z888 Allergy status to other drugs, medicaments and biological substances status: Secondary | ICD-10-CM | POA: Diagnosis not present

## 2024-05-23 DIAGNOSIS — M810 Age-related osteoporosis without current pathological fracture: Secondary | ICD-10-CM | POA: Diagnosis not present

## 2024-05-23 DIAGNOSIS — Z79890 Hormone replacement therapy (postmenopausal): Secondary | ICD-10-CM | POA: Diagnosis not present

## 2024-05-23 DIAGNOSIS — R32 Unspecified urinary incontinence: Secondary | ICD-10-CM | POA: Diagnosis not present

## 2024-05-23 DIAGNOSIS — L309 Dermatitis, unspecified: Secondary | ICD-10-CM | POA: Diagnosis not present

## 2024-05-28 ENCOUNTER — Ambulatory Visit

## 2024-06-03 ENCOUNTER — Ambulatory Visit

## 2024-06-03 DIAGNOSIS — M8000XS Age-related osteoporosis with current pathological fracture, unspecified site, sequela: Secondary | ICD-10-CM | POA: Diagnosis not present

## 2024-06-03 DIAGNOSIS — M810 Age-related osteoporosis without current pathological fracture: Secondary | ICD-10-CM | POA: Diagnosis not present

## 2024-06-03 MED ORDER — DENOSUMAB 60 MG/ML ~~LOC~~ SOSY: 60.0000 mg | PREFILLED_SYRINGE | SUBCUTANEOUS | Status: AC

## 2024-06-03 NOTE — Progress Notes (Signed)
 Per orders of Dr. Greig Ring, injection of Prolia   given by Danna CINDERELLA Hummer in left arm. Patient tolerated injection well. Patient will be contacted to make  appointment for 6 months.

## 2024-06-04 DIAGNOSIS — M47812 Spondylosis without myelopathy or radiculopathy, cervical region: Secondary | ICD-10-CM | POA: Diagnosis not present

## 2024-06-04 DIAGNOSIS — M5440 Lumbago with sciatica, unspecified side: Secondary | ICD-10-CM | POA: Diagnosis not present

## 2024-06-05 ENCOUNTER — Ambulatory Visit (INDEPENDENT_AMBULATORY_CARE_PROVIDER_SITE_OTHER): Admitting: Family Medicine

## 2024-06-05 ENCOUNTER — Encounter: Payer: Self-pay | Admitting: Family Medicine

## 2024-06-05 DIAGNOSIS — I2510 Atherosclerotic heart disease of native coronary artery without angina pectoris: Secondary | ICD-10-CM | POA: Diagnosis not present

## 2024-06-05 DIAGNOSIS — I252 Old myocardial infarction: Secondary | ICD-10-CM | POA: Diagnosis not present

## 2024-06-05 MED ORDER — WEGOVY 0.25 MG/0.5ML ~~LOC~~ SOAJ: 0.2500 mg | SUBCUTANEOUS | 1 refills | Status: DC

## 2024-06-05 NOTE — Progress Notes (Signed)
 Patient ID: Samantha Perkins, female    DOB: [birth year], 73 y.o.   MRN: [id number]  This visit was conducted in person.  BP 110/72   Pulse 82   Temp 98.2 F (36.8 C) (Temporal)   Ht 5' 0.5 (1.537 m)   Wt 206 lb 2 oz (93.5 kg)   SpO2 98%   BMI 39.59 kg/m    CC:  Chief Complaint  Patient presents with   Obesity    Discuss GLP1 weight loss medication   Joint Pain   Tachycardia    Being seen by Cardiologist for this    Subjective:   HPI: Samantha Perkins is a 73 y.o. female presenting on 06/05/2024 for Obesity (Discuss GLP1 weight loss medication), Joint Pain, and Tachycardia (Being seen by Cardiologist for this)  Patient with morbid obesity, BMI 39.59 with associated risk factors of hyperlipidemia and coronary artery disease. History of MI in 2007  Followed by Dr. Trecia cardiology. Has ZIO monitor in place to evaluate tachycardia  Stopping statin has not helped with the leg pain.   She has been under significant stress with her husbands health issue.  She has tried and failed multiple weight loss methods including weight watchers, regular exercise, low-carb diet.  For exercise she is limited given chronic back pain.. she is able to do recumbent bike.       Relevant past medical, surgical, family and social history reviewed and updated as indicated. Interim medical history since our last visit reviewed. Allergies and medications reviewed and updated. Outpatient Medications Prior to Visit  Medication Sig Dispense Refill   acetaminophen  (TYLENOL ) 500 MG tablet Take 1,000 mg by mouth every 6 (six) hours as needed.     amoxicillin  (AMOXIL ) 500 MG capsule Take 500 mg by mouth as needed. TAKE 4 CAPSULES BY MOUTH ONE TO TWO HOURS PRIOR TO DENTAL WORK     aspirin  EC 81 MG tablet Take 81 mg by mouth daily. Swallow whole.     atorvastatin  (LIPITOR) 10 MG tablet Take 1 tablet (10 mg total) by mouth daily. 90 tablet 3   Calcium  Carb-Cholecalciferol (CALCIUM  600 + D PO) Take 1  tablet by mouth daily.     cetirizine  (ZYRTEC ) 10 MG tablet Take 10 mg by mouth daily as needed for allergies.     clobetasol (TEMOVATE) 0.05 % GEL Apply 1 application  topically 2 (two) times daily as needed (Unknown).     Coenzyme Q10 (COQ-10) 200 MG CAPS Take 200 mg by mouth daily at 6 (six) AM.     desonide (DESOWEN) 0.05 % cream Apply 1 Application topically daily as needed (Skin).     diclofenac Sodium (VOLTAREN) 1 % GEL Apply 2 g topically daily as needed (pain).     Docosanol (ABREVA EX) Apply 1 Application topically 3 (three) times daily as needed (Fever blister).     ezetimibe  (ZETIA ) 10 MG tablet Take 1 tablet by mouth once daily 90 tablet 3   Ketotifen Fumarate (REFRESH EYE ITCH RELIEF OP) Place 1 drop into both eyes daily as needed (Dry eyes).     L-FORMULA LYSINE HCL PO Take 500 mg by mouth daily.     Lactobacillus-Inulin (CULTURELLE ADULT ULT BALANCE PO) Take 1 tablet by mouth daily.     levothyroxine  (SYNTHROID ) 100 MCG tablet Take 1 tablet (100 mcg total) by mouth daily. 90 tablet 3   metoprolol  succinate (TOPROL -XL) 25 MG 24 hr tablet Take 1 tablet by mouth once daily 90 tablet 3  Multiple Vitamins-Minerals (ONE-A-DAY WOMENS 50+ ADVANTAGE PO) Take by mouth daily.     nitroGLYCERIN  (NITROSTAT ) 0.4 MG SL tablet PLACE 1 TABLET UNDER THE TONGUE AS NEEDED 25 tablet 11   PROLIA  60 MG/ML SOSY injection INJECT 60MG  INTO THE SKIN ONCE FOR 1 DOSE 1 mL 0   sodium chloride  (OCEAN) 0.65 % SOLN nasal spray Place 1 spray into both nostrils daily as needed for congestion.     valACYclovir  (VALTREX ) 1000 MG tablet Take 1,000 mg by mouth daily as needed (Cold Sores).     Vitafusion Fiber Well 2.5 g CHEW Chew 1 tablet by mouth daily.     melatonin 5 MG TABS Take 5 mg by mouth at bedtime as needed (Sleep). (Patient not taking: Reported on 05/22/2024)     methocarbamol (ROBAXIN) 500 MG tablet Take 500 mg by mouth every 6 (six) hours as needed. (Patient not taking: Reported on 05/22/2024)     OVER  THE COUNTER MEDICATION Natrol Melatonin 3Gummy 3 mg to take 1 gummy at bedtime (Patient not taking: Reported on 05/22/2024)     Facility-Administered Medications Prior to Visit  Medication Dose Route Frequency Provider Last Rate Last Admin   denosumab  (PROLIA ) injection 60 mg  60 mg Subcutaneous Once Victormanuel Mclure E, MD       denosumab  (PROLIA ) injection 60 mg  60 mg Subcutaneous Once Island Dohmen E, MD       [START ON 11/30/2024] denosumab  (PROLIA ) injection 60 mg  60 mg Subcutaneous Q6 months Shye Doty E, MD         Per HPI unless specifically indicated in ROS section below Review of Systems  Constitutional:  Negative for fatigue and fever.  HENT:  Negative for congestion.   Eyes:  Negative for pain.  Respiratory:  Negative for cough and shortness of breath.   Cardiovascular:  Negative for chest pain, palpitations and leg swelling.  Gastrointestinal:  Negative for abdominal pain.  Genitourinary:  Negative for dysuria and vaginal bleeding.  Musculoskeletal:  Negative for back pain.  Neurological:  Negative for syncope, light-headedness and headaches.  Psychiatric/Behavioral:  Negative for dysphoric mood.    Objective:  BP 110/72   Pulse 82   Temp 98.2 F (36.8 C) (Temporal)   Ht 5' 0.5 (1.537 m)   Wt 206 lb 2 oz (93.5 kg)   SpO2 98%   BMI 39.59 kg/m   Wt Readings from Last 3 Encounters:  06/05/24 206 lb 2 oz (93.5 kg)  05/22/24 205 lb 3.2 oz (93.1 kg)  01/11/24 202 lb 2 oz (91.7 kg)      Physical Exam Constitutional:      General: She is not in acute distress.    Appearance: Normal appearance. She is well-developed. She is not ill-appearing or toxic-appearing.  HENT:     Head: Normocephalic.     Right Ear: Hearing, tympanic membrane, ear canal and external ear normal. Tympanic membrane is not erythematous, retracted or bulging.     Left Ear: Hearing, tympanic membrane, ear canal and external ear normal. Tympanic membrane is not erythematous, retracted or bulging.      Nose: No mucosal edema or rhinorrhea.     Right Sinus: No maxillary sinus tenderness or frontal sinus tenderness.     Left Sinus: No maxillary sinus tenderness or frontal sinus tenderness.     Mouth/Throat:     Pharynx: Uvula midline.  Eyes:     General: Lids are normal. Lids are everted, no foreign bodies appreciated.  Conjunctiva/sclera: Conjunctivae normal.     Pupils: Pupils are equal, round, and reactive to light.  Neck:     Thyroid : No thyroid  mass or thyromegaly.     Vascular: No carotid bruit.     Trachea: Trachea normal.  Cardiovascular:     Rate and Rhythm: Normal rate and regular rhythm.     Pulses: Normal pulses.     Heart sounds: Normal heart sounds, S1 normal and S2 normal. No murmur heard.    No friction rub. No gallop.  Pulmonary:     Effort: Pulmonary effort is normal. No tachypnea or respiratory distress.     Breath sounds: Normal breath sounds. No decreased breath sounds, wheezing, rhonchi or rales.  Abdominal:     General: Bowel sounds are normal.     Palpations: Abdomen is soft.     Tenderness: There is no abdominal tenderness.  Musculoskeletal:     Cervical back: Normal range of motion and neck supple.  Skin:    General: Skin is warm and dry.     Findings: No rash.  Neurological:     Mental Status: She is alert.  Psychiatric:        Mood and Affect: Mood is not anxious or depressed.        Speech: Speech normal.        Behavior: Behavior normal. Behavior is cooperative.        Thought Content: Thought content normal.        Judgment: Judgment normal.       Results for orders placed or performed in visit on 05/22/24  Basic Metabolic Panel   Collection Time: 05/22/24  8:45 AM  Result Value Ref Range   Sodium 139 135 - 145 mEq/L   Potassium 4.2 3.5 - 5.1 mEq/L   Chloride 101 96 - 112 mEq/L   CO2 30 19 - 32 mEq/L   Glucose, Bld 101 (H) 70 - 99 mg/dL   BUN 15 6 - 23 mg/dL   Creatinine, Ser 9.18 0.40 - 1.20 mg/dL   GFR 27.76 >39.99 mL/min    Calcium  9.1 8.4 - 10.5 mg/dL    Assessment and Plan  Morbid obesity (HCC) Assessment & Plan:  BMI 39 plus additional comorbidity of  CAD   Discussed weihgt management optionc.   GLP1 medication is excellent option for this patient to reduce CVD risk.  Reviewed  pharmacology, med SE, course of treatment etc. Questions answered in detail.  Start wegovy  0.25 mg weekly.. call if tolerating for increase in 2 week.  Follow up weight management in 4 weeks. Encouraged exercise, weight loss, healthy eating habits.     Coronary artery disease involving native coronary artery of native heart without angina pectoris Assessment & Plan: GLP1 medication is excellent option for this patient to reduce CVD risk.   History of MI (myocardial infarction)  Other orders -     Wegovy ; Inject 0.25 mg into the skin once a week.  Dispense: 2 mL; Refill: 1    Return in about 4 weeks (around 07/03/2024) for  follow up weight managment.SABRA Greig Ring, MD

## 2024-06-05 NOTE — Assessment & Plan Note (Addendum)
 BMI 39 plus additional comorbidity of  CAD   Discussed weihgt management optionc.   GLP1 medication is excellent option for this patient to reduce CVD risk.  Reviewed  pharmacology, med SE, course of treatment etc. Questions answered in detail.  Start wegovy  0.25 mg weekly.. call if tolerating for increase in 2 week.  Follow up weight management in 4 weeks. Encouraged exercise, weight loss, healthy eating habits.

## 2024-06-05 NOTE — Assessment & Plan Note (Signed)
 GLP1 medication is excellent option for this patient to reduce CVD risk.

## 2024-06-06 ENCOUNTER — Telehealth: Payer: Self-pay | Admitting: Cardiovascular Disease

## 2024-06-06 ENCOUNTER — Telehealth: Payer: Self-pay | Admitting: Family Medicine

## 2024-06-06 DIAGNOSIS — M47812 Spondylosis without myelopathy or radiculopathy, cervical region: Secondary | ICD-10-CM | POA: Diagnosis not present

## 2024-06-06 DIAGNOSIS — M5440 Lumbago with sciatica, unspecified side: Secondary | ICD-10-CM | POA: Diagnosis not present

## 2024-06-06 NOTE — Telephone Encounter (Signed)
 Please put this patients Wegovy  prescription thorough prior auth.. She has past MI and active coronary artery disease and it should be covered for cardiovascular disease risk reduction.

## 2024-06-06 NOTE — Telephone Encounter (Signed)
 Spoke with Steffan and provided the follow codes:   Codes Comments   Morbid obesity (HCC)  - Primary E66.01   Coronary artery disease involving native coronary artery of native heart without angina pectoris I25.10   History of MI (myocardial infarction) I2

## 2024-06-06 NOTE — Telephone Encounter (Signed)
 Pt c/o medication issue:  1. Name of Medication:   atorvastatin  (LIPITOR) 10 MG tablet   2. How are you currently taking this medication (dosage and times per day)?   Not taking  3. Are you having a reaction (difficulty breathing--STAT)?   4. What is your medication issue?   Patient called to report to Dr. Barbaraann that she has been off this medication for the last 2 weeks and she is still having the pain/soreness in both legs. Patient wants to know if she should go back on this medication.

## 2024-06-06 NOTE — Telephone Encounter (Signed)
 Copied from CRM [id number]. Topic: Clinical - Prescription Issue >> Jun 06, 2024 11:50 AM Carlyon D wrote: Reason for CRM: Steffan with [facility] is calling in regards to pt medicationsemaglutide-weight management (WEGOVY ) 0.25 MG/0.5ML SOAJ SQ injection Steffan states the insurance is requiring a diagnotics code for this. Please call victor back with this info at earliest convenience, best call back # for Steffan at [employer]  [phone].

## 2024-06-06 NOTE — Telephone Encounter (Signed)
 Returned patient's call, 2 identifiers used. Patient reports that at her last visit with Dr. Barbaraann on 05/22/24 she was advised to take a 2 week holiday from lipitor due to muscle soreness. She states she has had this muscle soreness for years, and unfortunately the 2 week statin holiday did not relieve her symptoms. She denies any SOB, discoloration or worsening pain on exertion. Forwarded to Dr. Barbaraann for advice.

## 2024-06-09 ENCOUNTER — Telehealth: Payer: Self-pay

## 2024-06-09 ENCOUNTER — Other Ambulatory Visit (HOSPITAL_COMMUNITY): Payer: Self-pay

## 2024-06-09 NOTE — Telephone Encounter (Signed)
 Pharmacy Patient Advocate Encounter   Received notification from Patient Advice Request messages that prior authorization for Wegovy  0.25 is required/requested.   Insurance verification completed.   The patient is insured through [employer].   Per test claim: PA required; PA submitted to above mentioned insurance via Latent Key/confirmation #/EOC [id number] Status is pending

## 2024-06-09 NOTE — Telephone Encounter (Signed)
 Pharmacy Patient Advocate Encounter  Received notification from [employer] that Prior Authorization for Wegovy  0.25 has been APPROVED from 07/18/23 to 06/18/25. Ran test claim, Copay is $687.05. This test claim was processed through [facility]- copay amounts may vary at other pharmacies due to pharmacy/plan contracts, or as the patient moves through the different stages of their insurance plan.   PA #/Case ID/Reference #: # [id number]

## 2024-06-09 NOTE — Telephone Encounter (Signed)
 PA approved but patient cost is still $687.05.

## 2024-06-10 ENCOUNTER — Encounter: Payer: Self-pay | Admitting: Pharmacist

## 2024-06-10 DIAGNOSIS — M5440 Lumbago with sciatica, unspecified side: Secondary | ICD-10-CM | POA: Diagnosis not present

## 2024-06-10 DIAGNOSIS — M47812 Spondylosis without myelopathy or radiculopathy, cervical region: Secondary | ICD-10-CM | POA: Diagnosis not present

## 2024-06-10 NOTE — Progress Notes (Unsigned)
 Chart Review Reason: Drug information Question - Medication Access - GLP1 for CVD  Summary: PCP would like to start Wegovy  for CVD risk prevention given past MI/coronary artery disease. [facility] Test Claim = [phone].   Per test claim details, it is looking like patient still has not met her deductible though thereafter she does have a large coinsurance (meaning she is required to pay a percentage of the med cost), making her monthly cost several hundred dollars regardless of drug deductible.   Per [employer], Wegovy  is not on their medication formulary.  While preferred brand-name drugs are ~$47/month, non-formulary medications are expensive as [employer] requires patients to pay ~45% of the cash price of the drug.   It appears most medicare plans only offer Wegovy  as specialty Tier with monthly cost totaling several hundred per month.    Considerations: Forwarded to PCP/Clinical pool for consideration    Samantha Perkins, PharmD Clinical Pharmacist [facility] [phone]

## 2024-06-11 NOTE — Telephone Encounter (Signed)
 Let pt know. Let me know if she is interested in cash program

## 2024-06-13 DIAGNOSIS — R002 Palpitations: Secondary | ICD-10-CM | POA: Diagnosis not present

## 2024-06-16 DIAGNOSIS — M5440 Lumbago with sciatica, unspecified side: Secondary | ICD-10-CM | POA: Diagnosis not present

## 2024-06-16 DIAGNOSIS — M47812 Spondylosis without myelopathy or radiculopathy, cervical region: Secondary | ICD-10-CM | POA: Diagnosis not present

## 2024-06-17 DIAGNOSIS — R002 Palpitations: Secondary | ICD-10-CM

## 2024-06-18 ENCOUNTER — Ambulatory Visit: Payer: Self-pay | Admitting: Cardiovascular Disease

## 2024-06-19 DIAGNOSIS — M5440 Lumbago with sciatica, unspecified side: Secondary | ICD-10-CM | POA: Diagnosis not present

## 2024-06-19 DIAGNOSIS — M47812 Spondylosis without myelopathy or radiculopathy, cervical region: Secondary | ICD-10-CM | POA: Diagnosis not present

## 2024-06-24 DIAGNOSIS — M47812 Spondylosis without myelopathy or radiculopathy, cervical region: Secondary | ICD-10-CM | POA: Diagnosis not present

## 2024-06-24 DIAGNOSIS — M5440 Lumbago with sciatica, unspecified side: Secondary | ICD-10-CM | POA: Diagnosis not present

## 2024-07-01 ENCOUNTER — Ambulatory Visit (HOSPITAL_COMMUNITY)
Admission: RE | Admit: 2024-07-01 | Discharge: 2024-07-01 | Disposition: A | Source: Ambulatory Visit | Attending: Internal Medicine | Admitting: Internal Medicine

## 2024-07-01 DIAGNOSIS — R0602 Shortness of breath: Secondary | ICD-10-CM

## 2024-07-01 LAB — ECHOCARDIOGRAM COMPLETE
Area-P 1/2: 2.18 cm2
Calc EF: 59.9 %
S' Lateral: 3.3 cm
Single Plane A2C EF: 59 %
Single Plane A4C EF: 58.7 %

## 2024-07-01 MED ORDER — PERFLUTREN LIPID MICROSPHERE: 1.0000 mL | INTRAVENOUS | Status: DC | PRN

## 2024-07-15 ENCOUNTER — Ambulatory Visit (INDEPENDENT_AMBULATORY_CARE_PROVIDER_SITE_OTHER): Admitting: Family Medicine

## 2024-07-15 ENCOUNTER — Encounter: Payer: Self-pay | Admitting: Family Medicine

## 2024-07-15 VITALS — BP 110/60 | HR 76 | Temp 98.6°F | Ht 60.5 in | Wt 204.4 lb

## 2024-07-15 DIAGNOSIS — H6991 Unspecified Eustachian tube disorder, right ear: Secondary | ICD-10-CM | POA: Diagnosis not present

## 2024-07-15 NOTE — Progress Notes (Signed)
 "   Patient ID: Samantha Perkins, female    DOB: May 21, 1951, 73 y.o.   MRN: [id number]  This visit was conducted in person.  BP 110/60   Pulse 76   Temp 98.6 F (37 C) (Oral)   Ht 5' 0.5 (1.537 m)   Wt 204 lb 6 oz (92.7 kg)   SpO2 96%   BMI 39.26 kg/m    CC:  Chief Complaint  Patient presents with   Sinus Drainage   Tinnitus    Right Ear    Subjective:   HPI: Samantha Perkins is a 73 y.o. female presenting on 07/15/2024 for Sinus Drainage and Tinnitus (Right Ear)   Date of onset:  2 weeks Initial symptoms included  ringing in right ear.. high pitched.. not a heart beat sound Has chronic allergy symptoms.. nasal congestion.. using benadryl and ceterizine.  Saline   spray couple times a week.  No change in color of mucus, no fever, no SOB, no face pain   She started wearing hearing aids 2 months ago... had checkup 1 onth ago  Sick contacts:  COVID testing:   none    No history of chronic lung disease such as asthma or COPD. Non-smoker.       Relevant past medical, surgical, family and social history reviewed and updated as indicated. Interim medical history since our last visit reviewed. Allergies and medications reviewed and updated. Outpatient Medications Prior to Visit  Medication Sig Dispense Refill   acetaminophen  (TYLENOL ) 500 MG tablet Take 1,000 mg by mouth every 6 (six) hours as needed.     amoxicillin  (AMOXIL ) 500 MG capsule Take 500 mg by mouth as needed. TAKE 4 CAPSULES BY MOUTH ONE TO TWO HOURS PRIOR TO DENTAL WORK     aspirin  EC 81 MG tablet Take 81 mg by mouth daily. Swallow whole.     atorvastatin  (LIPITOR) 10 MG tablet Take 1 tablet (10 mg total) by mouth daily. 90 tablet 3   Calcium  Carb-Cholecalciferol (CALCIUM  600 + D PO) Take 1 tablet by mouth daily.     cetirizine  (ZYRTEC ) 10 MG tablet Take 10 mg by mouth daily as needed for allergies.     clobetasol (TEMOVATE) 0.05 % GEL Apply 1 application  topically 2 (two) times daily as needed (Unknown).      Coenzyme Q10 (COQ-10) 200 MG CAPS Take 200 mg by mouth daily at 6 (six) AM.     desonide (DESOWEN) 0.05 % cream Apply 1 Application topically daily as needed (Skin).     diclofenac Sodium (VOLTAREN) 1 % GEL Apply 2 g topically daily as needed (pain).     Docosanol (ABREVA EX) Apply 1 Application topically 3 (three) times daily as needed (Fever blister).     ezetimibe  (ZETIA ) 10 MG tablet Take 1 tablet by mouth once daily 90 tablet 3   Ketotifen Fumarate (REFRESH EYE ITCH RELIEF OP) Place 1 drop into both eyes daily as needed (Dry eyes).     L-FORMULA LYSINE HCL PO Take 500 mg by mouth daily.     Lactobacillus-Inulin (CULTURELLE ADULT ULT BALANCE PO) Take 1 tablet by mouth daily.     levothyroxine  (SYNTHROID ) 100 MCG tablet Take 1 tablet (100 mcg total) by mouth daily. 90 tablet 3   metoprolol  succinate (TOPROL -XL) 25 MG 24 hr tablet Take 1 tablet by mouth once daily 90 tablet 3   Multiple Vitamins-Minerals (ONE-A-DAY WOMENS 50+ ADVANTAGE PO) Take by mouth daily.     nitroGLYCERIN  (NITROSTAT ) 0.4 MG SL  tablet PLACE 1 TABLET UNDER THE TONGUE AS NEEDED 25 tablet 11   PROLIA  60 MG/ML SOSY injection INJECT 60MG  INTO THE SKIN ONCE FOR 1 DOSE 1 mL 0   sodium chloride  (OCEAN) 0.65 % SOLN nasal spray Place 1 spray into both nostrils daily as needed for congestion.     valACYclovir  (VALTREX ) 1000 MG tablet Take 1,000 mg by mouth daily as needed (Cold Sores).     Vitafusion Fiber Well 2.5 g CHEW Chew 1 tablet by mouth daily.     semaglutide -weight management (WEGOVY ) 0.25 MG/0.5ML SOAJ SQ injection Inject 0.25 mg into the skin once a week. 2 mL 1   Facility-Administered Medications Prior to Visit  Medication Dose Route Frequency Provider Last Rate Last Admin   denosumab  (PROLIA ) injection 60 mg  60 mg Subcutaneous Once Teniyah Seivert E, MD       denosumab  (PROLIA ) injection 60 mg  60 mg Subcutaneous Once Loriene Taunton E, MD       [START ON 11/30/2024] denosumab  (PROLIA ) injection 60 mg  60 mg Subcutaneous  Q6 months Montserrath Madding E, MD         Per HPI unless specifically indicated in ROS section below Review of Systems  Constitutional:  Negative for fatigue and fever.  HENT:  Positive for congestion and postnasal drip. Negative for ear discharge, ear pain, sinus pressure, sinus pain and sore throat.   Eyes:  Negative for pain.  Respiratory:  Negative for cough and shortness of breath.   Cardiovascular:  Negative for chest pain, palpitations and leg swelling.  Gastrointestinal:  Negative for abdominal pain.  Genitourinary:  Negative for dysuria and vaginal bleeding.  Musculoskeletal:  Negative for back pain.  Neurological:  Negative for syncope, light-headedness and headaches.  Psychiatric/Behavioral:  Negative for dysphoric mood.    Objective:  BP 110/60   Pulse 76   Temp 98.6 F (37 C) (Oral)   Ht 5' 0.5 (1.537 m)   Wt 204 lb 6 oz (92.7 kg)   SpO2 96%   BMI 39.26 kg/m   Wt Readings from Last 3 Encounters:  07/15/24 204 lb 6 oz (92.7 kg)  06/05/24 206 lb 2 oz (93.5 kg)  05/22/24 205 lb 3.2 oz (93.1 kg)      Physical Exam Constitutional:      General: She is not in acute distress.    Appearance: Normal appearance. She is well-developed. She is not ill-appearing or toxic-appearing.  HENT:     Head: Normocephalic.     Right Ear: Hearing, ear canal and external ear normal. A middle ear effusion is present. Tympanic membrane is not erythematous, retracted or bulging.     Left Ear: Hearing, tympanic membrane, ear canal and external ear normal. Tympanic membrane is not erythematous, retracted or bulging.     Nose: No mucosal edema or rhinorrhea.     Right Sinus: No maxillary sinus tenderness or frontal sinus tenderness.     Left Sinus: No maxillary sinus tenderness or frontal sinus tenderness.     Mouth/Throat:     Pharynx: Uvula midline.  Eyes:     General: Lids are normal. Lids are everted, no foreign bodies appreciated.     Conjunctiva/sclera: Conjunctivae normal.      Pupils: Pupils are equal, round, and reactive to light.  Neck:     Thyroid : No thyroid  mass or thyromegaly.     Vascular: No carotid bruit.     Trachea: Trachea normal.  Cardiovascular:     Rate and Rhythm:  Normal rate and regular rhythm.     Pulses: Normal pulses.     Heart sounds: Normal heart sounds, S1 normal and S2 normal. No murmur heard.    No friction rub. No gallop.  Pulmonary:     Effort: Pulmonary effort is normal. No tachypnea or respiratory distress.     Breath sounds: Normal breath sounds. No decreased breath sounds, wheezing, rhonchi or rales.  Abdominal:     General: Bowel sounds are normal.     Palpations: Abdomen is soft.     Tenderness: There is no abdominal tenderness.  Musculoskeletal:     Cervical back: Normal range of motion and neck supple.  Skin:    General: Skin is warm and dry.     Findings: No rash.  Neurological:     Mental Status: She is alert.  Psychiatric:        Mood and Affect: Mood is not anxious or depressed.        Speech: Speech normal.        Behavior: Behavior normal. Behavior is cooperative.        Thought Content: Thought content normal.        Judgment: Judgment normal.       Results for orders placed or performed during the hospital encounter of 07/01/24  ECHOCARDIOGRAM COMPLETE   Collection Time: 07/01/24  8:47 AM  Result Value Ref Range   S' Lateral 3.30 cm   Single Plane A4C EF 58.7 %   Single Plane A2C EF 59.0 %   Calc EF 59.9 %   Area-P 1/2 2.18 cm2   Est EF 60 - 65%     Assessment and Plan  Dysfunction of right eustachian tube Assessment & Plan: Acute, due to underlying allergies. She has upcoming steroid injection planned for her back.  We will use Flonase 2 sprays per nostril daily along with daily nasal saline spray/irrigation. Continue allergy regimen.  If not improving consider prednisone  taper.     No follow-ups on file.   Greig Ring, MD  "

## 2024-07-15 NOTE — Assessment & Plan Note (Signed)
 Acute, due to underlying allergies. She has upcoming steroid injection planned for her back.  We will use Flonase 2 sprays per nostril daily along with daily nasal saline spray/irrigation. Continue allergy regimen.  If not improving consider prednisone  taper.

## 2024-07-21 ENCOUNTER — Encounter: Payer: Self-pay | Admitting: Family Medicine

## 2024-07-22 MED ORDER — PREDNISONE 10 MG PO TABS: ORAL_TABLET | ORAL | 0 refills | Status: AC

## 2024-07-30 ENCOUNTER — Encounter: Payer: Self-pay | Admitting: Family Medicine

## 2024-07-30 ENCOUNTER — Ambulatory Visit (INDEPENDENT_AMBULATORY_CARE_PROVIDER_SITE_OTHER): Admitting: Family Medicine

## 2024-07-30 VITALS — BP 110/58 | HR 71 | Temp 98.2°F | Ht 60.5 in | Wt 207.5 lb

## 2024-07-30 DIAGNOSIS — H9311 Tinnitus, right ear: Secondary | ICD-10-CM | POA: Diagnosis not present

## 2024-07-30 DIAGNOSIS — H6991 Unspecified Eustachian tube disorder, right ear: Secondary | ICD-10-CM

## 2024-07-30 NOTE — Progress Notes (Signed)
 "   Patient ID: Samantha Perkins, female    DOB: [birth year], 74 y.o.   MRN: [id number]  This visit was conducted in person.  BP (!) 110/58   Pulse 71   Temp 98.2 F (36.8 C) (Oral)   Ht 5' 0.5 (1.537 m)   Wt 207 lb 8 oz (94.1 kg)   SpO2 97%   BMI 39.86 kg/m    CC:  Chief Complaint  Patient presents with   Medical Management of Chronic Issues    Pt here for f/u  Both on ears. Pt wants ENT referral.  Pt states it a constant ringing in her ears. Pt states she had a  new pain that was a strong ache in her ear that didn't last long.     Subjective:   HPI: Samantha Perkins is a 74 y.o. female presenting on 07/30/2024 for Medical Management of Chronic Issues (Pt here for f/u  Both on ears. Pt wants ENT referral. /Pt states it a constant ringing in her ears. Pt states she had a  new pain that was a strong ache in her ear that didn't last long. )  Seen on December 30 with 2-week history of ringing in right ear, high pitched along with nasal congestion. Found to have eustachian tube dysfunction. Treated with Flonase, received steroid injection for her back Neither of these helped symptoms. Prescribed prednisone  30/20/10 over 7 days taper. Today she reports minimal change continued constant ringing now in right ears. Did have momentary sharp pain in  right ear at 1 time.   Chronic sinus drainage, PND.  No fever, no shortness of breath.    Has chronic hearing loss and hearing aides bilateral ears.SABRA now worse on right  Relevant past medical, surgical, family and social history reviewed and updated as indicated. Interim medical history since our last visit reviewed. Allergies and medications reviewed and updated. Outpatient Medications Prior to Visit  Medication Sig Dispense Refill   acetaminophen  (TYLENOL ) 500 MG tablet Take 1,000 mg by mouth every 6 (six) hours as needed.     amoxicillin  (AMOXIL ) 500 MG capsule Take 500 mg by mouth as needed. TAKE 4 CAPSULES BY MOUTH ONE TO TWO HOURS  PRIOR TO DENTAL WORK     aspirin  EC 81 MG tablet Take 81 mg by mouth daily. Swallow whole.     atorvastatin  (LIPITOR) 10 MG tablet Take 1 tablet (10 mg total) by mouth daily. 90 tablet 3   Calcium  Carb-Cholecalciferol (CALCIUM  600 + D PO) Take 1 tablet by mouth daily.     cetirizine  (ZYRTEC ) 10 MG tablet Take 10 mg by mouth daily as needed for allergies.     clobetasol (TEMOVATE) 0.05 % GEL Apply 1 application  topically 2 (two) times daily as needed (Unknown).     Coenzyme Q10 (COQ-10) 200 MG CAPS Take 200 mg by mouth daily at 6 (six) AM.     desonide (DESOWEN) 0.05 % cream Apply 1 Application topically daily as needed (Skin).     diclofenac Sodium (VOLTAREN) 1 % GEL Apply 2 g topically daily as needed (pain).     Docosanol (ABREVA EX) Apply 1 Application topically 3 (three) times daily as needed (Fever blister).     ezetimibe  (ZETIA ) 10 MG tablet Take 1 tablet by mouth once daily 90 tablet 3   Ketotifen Fumarate (REFRESH EYE ITCH RELIEF OP) Place 1 drop into both eyes daily as needed (Dry eyes).     L-FORMULA LYSINE HCL PO Take 500 mg  by mouth daily.     Lactobacillus-Inulin (CULTURELLE ADULT ULT BALANCE PO) Take 1 tablet by mouth daily.     levothyroxine  (SYNTHROID ) 100 MCG tablet Take 1 tablet (100 mcg total) by mouth daily. 90 tablet 3   metoprolol  succinate (TOPROL -XL) 25 MG 24 hr tablet Take 1 tablet by mouth once daily 90 tablet 3   Multiple Vitamins-Minerals (ONE-A-DAY WOMENS 50+ ADVANTAGE PO) Take by mouth daily.     nitroGLYCERIN  (NITROSTAT ) 0.4 MG SL tablet PLACE 1 TABLET UNDER THE TONGUE AS NEEDED 25 tablet 11   predniSONE  (DELTASONE ) 10 MG tablet 3 tabs by mouth daily x 3 days, then 2 tabs by mouth daily x 2 days then 1 tab by mouth daily x 2 days 15 tablet 0   PROLIA  60 MG/ML SOSY injection INJECT 60MG  INTO THE SKIN ONCE FOR 1 DOSE 1 mL 0   sodium chloride  (OCEAN) 0.65 % SOLN nasal spray Place 1 spray into both nostrils daily as needed for congestion.     valACYclovir  (VALTREX )  1000 MG tablet Take 1,000 mg by mouth daily as needed (Cold Sores).     Vitafusion Fiber Well 2.5 g CHEW Chew 1 tablet by mouth daily.     Facility-Administered Medications Prior to Visit  Medication Dose Route Frequency Provider Last Rate Last Admin   denosumab  (PROLIA ) injection 60 mg  60 mg Subcutaneous Once Divinity Kyler E, MD       denosumab  (PROLIA ) injection 60 mg  60 mg Subcutaneous Once Dyani Babel E, MD       [START ON 11/30/2024] denosumab  (PROLIA ) injection 60 mg  60 mg Subcutaneous Q6 months Charie Pinkus E, MD         Per HPI unless specifically indicated in ROS section below Review of Systems  Constitutional:  Negative for fatigue, fever and unexpected weight change.  HENT:  Positive for ear pain. Negative for congestion, sinus pressure, sneezing, sore throat and trouble swallowing.   Eyes:  Negative for pain and itching.  Respiratory:  Negative for cough, shortness of breath and wheezing.   Cardiovascular:  Negative for chest pain, palpitations and leg swelling.  Gastrointestinal:  Negative for abdominal pain, blood in stool, constipation, diarrhea and nausea.  Genitourinary:  Negative for difficulty urinating, dysuria, hematuria, menstrual problem and vaginal discharge.  Skin:  Negative for rash.  Neurological:  Negative for syncope, weakness, light-headedness, numbness and headaches.  Psychiatric/Behavioral:  Negative for confusion and dysphoric mood. The patient is not nervous/anxious.    Objective:  BP (!) 110/58   Pulse 71   Temp 98.2 F (36.8 C) (Oral)   Ht 5' 0.5 (1.537 m)   Wt 207 lb 8 oz (94.1 kg)   SpO2 97%   BMI 39.86 kg/m   Wt Readings from Last 3 Encounters:  07/30/24 207 lb 8 oz (94.1 kg)  07/15/24 204 lb 6 oz (92.7 kg)  06/05/24 206 lb 2 oz (93.5 kg)      Physical Exam Constitutional:      General: She is not in acute distress.    Appearance: Normal appearance. She is well-developed. She is not ill-appearing or toxic-appearing.  HENT:      Head: Normocephalic.     Right Ear: Hearing, ear canal and external ear normal. A middle ear effusion is present. Tympanic membrane is not erythematous, retracted or bulging.     Left Ear: Hearing, tympanic membrane, ear canal and external ear normal.  No middle ear effusion. Tympanic membrane is not erythematous, retracted or  bulging.     Nose: No mucosal edema or rhinorrhea.     Right Sinus: No maxillary sinus tenderness or frontal sinus tenderness.     Left Sinus: No maxillary sinus tenderness or frontal sinus tenderness.     Mouth/Throat:     Pharynx: Uvula midline.  Eyes:     General: Lids are normal. Lids are everted, no foreign bodies appreciated.     Conjunctiva/sclera: Conjunctivae normal.     Pupils: Pupils are equal, round, and reactive to light.  Neck:     Thyroid : No thyroid  mass or thyromegaly.     Vascular: No carotid bruit.     Trachea: Trachea normal.  Cardiovascular:     Rate and Rhythm: Normal rate and regular rhythm.     Pulses: Normal pulses.     Heart sounds: Normal heart sounds, S1 normal and S2 normal. No murmur heard.    No friction rub. No gallop.  Pulmonary:     Effort: Pulmonary effort is normal. No tachypnea or respiratory distress.     Breath sounds: Normal breath sounds. No decreased breath sounds, wheezing, rhonchi or rales.  Abdominal:     General: Bowel sounds are normal.     Palpations: Abdomen is soft.     Tenderness: There is no abdominal tenderness.  Musculoskeletal:     Cervical back: Normal range of motion and neck supple.  Skin:    General: Skin is warm and dry.     Findings: No rash.  Neurological:     Mental Status: She is alert.  Psychiatric:        Mood and Affect: Mood is not anxious or depressed.        Speech: Speech normal.        Behavior: Behavior normal. Behavior is cooperative.        Thought Content: Thought content normal.        Judgment: Judgment normal.       Results for orders placed or performed during the  hospital encounter of 07/01/24  ECHOCARDIOGRAM COMPLETE   Collection Time: 07/01/24  8:47 AM  Result Value Ref Range   S' Lateral 3.30 cm   Single Plane A4C EF 58.7 %   Single Plane A2C EF 59.0 %   Calc EF 59.9 %   Area-P 1/2 2.18 cm2   Est EF 60 - 65%     Assessment and Plan  Tinnitus, right ear -     Ambulatory referral to ENT  Dysfunction of right eustachian tube Assessment & Plan:   Acute.. now ongoing >1 month.. no better after flonase, prednisone  taper and steroid injection. Referral place d to ENT for additional recommendations.  Orders: -     Ambulatory referral to ENT    No follow-ups on file.   Greig Ring, MD  "

## 2024-07-30 NOTE — Assessment & Plan Note (Signed)
"    Acute.. now ongoing >1 month.. no better after flonase, prednisone  taper and steroid injection. Referral place d to ENT for additional recommendations. "

## 2024-07-31 ENCOUNTER — Ambulatory Visit: Payer: Medicare HMO | Admitting: Internal Medicine

## 2024-07-31 ENCOUNTER — Encounter: Payer: Self-pay | Admitting: Internal Medicine

## 2024-07-31 ENCOUNTER — Other Ambulatory Visit

## 2024-07-31 VITALS — BP 120/70 | HR 75 | Ht 60.5 in | Wt 207.0 lb

## 2024-07-31 DIAGNOSIS — R1310 Dysphagia, unspecified: Secondary | ICD-10-CM

## 2024-07-31 DIAGNOSIS — E890 Postprocedural hypothyroidism: Secondary | ICD-10-CM | POA: Diagnosis not present

## 2024-07-31 NOTE — Progress Notes (Signed)
 "  Name: Samantha Perkins  MRN/ DOB: [id number], 01/26/1951    Age/ Sex: 74 y.o., female     PCP: Avelina Greig BRAVO, MD   Reason for Endocrinology Evaluation: Hashimoto's Thyroiditis and MNG     Initial Endocrinology Clinic Visit: 09/17/2020    PATIENT IDENTIFIER: Samantha Perkins is a 74 y.o., female with a past medical history of Hashimoto's thyroiditis, Osteopenia, Dyslipidemia and CAD . She has followed with [facility] since 09/17/2020 for consultative assistance with management of her Hashimoto's Thyroiditis and MNG  HISTORICAL SUMMARY:  Moved from [place]    She was diagnosed with hypothyroidism in 2008 S/P left Lobectomy in 2009 for benign thyroid   Nodule nodule , was started on LT-4 replacement in 2009 A few years later Liothyronine  was added due to sleep issues     Last thyroid  ultrasound 03/2020, no new nodules per pt .   Has hx of several stress fractures with osteopenia for years. She has been on Actonel and Boniva , as of recent has been on Prolia  ~ 3 yrs now She is on calcium  600 mg and Vitamin D through [facility]   Of note the pt had  MI in 2007, cardiac cath was unsucceful - follows with Dr. Burton    We stopped Liothyronine  01/2021 per pt request    SUBJECTIVE:     Today (07/31/2024):  Samantha Perkins is here for a follow up on postoperative hypothyroidism   Patient follows up with cardiology for coronary artery disease and dyslipidemia She did have right shoulder surgery in January, 2025, postop had difficuilty with prolonged recovery, limited mobility   Patient has been noted with weight gain She received intra-articular steroid injection in the back of the neck, undergoing PT for neck pain  She continues with palpitations  No changes in bowel changes  Her PCP prescribed GLP-1 agonist but it was $1600, she is considering oral Glp-1 agonist   No biotin intake   Levothyroxine  100 mcg ,daily    HISTORY:  Past Medical History:  Past Medical  History:  Diagnosis Date   Arthritis    Carotid artery occlusion    Complication of anesthesia    trouble waking up and her O2 goes low   Coronary artery disease    Distal LCX MI 2007 in [place]. PCI unable to be performed.    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    Hypertension    Hypothyroidism    Hashimoto's Thyroiditis   Myocardial infarction (HCC)    Pre-diabetes    Thyroid  disease    Past Surgical History:  Past Surgical History:  Procedure Laterality Date   ABDOMINAL HYSTERECTOMY  1991   APPENDECTOMY  1977   BILATERAL OOPHORECTOMY  1991   CARDIAC CATHETERIZATION     CATARACT EXTRACTION, BILATERAL     GUM SURGERY     KNEE ARTHROSCOPY Right 11/08/2017   LASIK Bilateral    OVARIAN CYST REMOVAL     REVERSE SHOULDER ARTHROPLASTY Right 08/10/2023   Procedure: REVERSE SHOULDER ARTHROPLASTY;  Surgeon: Sharl Selinda Dover, MD;  Location: [facility];  Service: Orthopedics;  Laterality: Right;   ROTATOR CUFF REPAIR Right 1982   ROTATOR CUFF REPAIR W/ DISTAL CLAVICLE EXCISION Left 07/05/2018   THYROID  SURGERY  12/12/2007   TUBAL LIGATION     WRIST SURGERY Left    Social History:  reports that she has never smoked. She has never used smokeless tobacco. She reports current alcohol use. She reports that she does not  use drugs. Family History:  Family History  Problem Relation Age of Onset   Heart failure Mother    Hypothyroidism Mother    Aneurysm Mother    Stroke Mother    Mitral valve prolapse Mother    Lung cancer Mother        smoker   Arthritis Father    Heart disease Father    Heart murmur Father    Diabetes Father    Hypertension Father    COPD Father    Diabetes Sister    Bladder Cancer Sister    Cataracts Sister    Arthritis Sister    Osteopenia Sister    Irritable bowel syndrome Sister    Mitral valve prolapse Sister    Diabetes Sister    Fibroids Sister    Lung cancer Sister        smoker   Mitral valve prolapse Sister    Hypertension Sister     Bradycardia Sister        w/ pacemaker   Crohn's disease Sister    Stroke Sister    Liver cancer Sister    Colon cancer Maternal Uncle        3 uncles    Pancreatic cancer Maternal Uncle        2 uncles with both colon and pancreatic   Diabetes Maternal Grandmother    Breast cancer Neg Hx    BRCA 1/2 Neg Hx      HOME MEDICATIONS: Allergies as of 07/31/2024       Reactions   Cymbalta [duloxetine Hcl] Rash   Tramadol Itching, Other (See Comments)   jittery   Duloxetine    Latex    Other reaction(s): Blisters   Wound Dressing Adhesive Other (See Comments)   Other Itching, Rash   Metal        Medication List        Accurate as of July 31, 2024 11:02 AM. If you have any questions, ask your nurse or doctor.          ABREVA EX Apply 1 Application topically 3 (three) times daily as needed (Fever blister).   acetaminophen  500 MG tablet Commonly known as: TYLENOL  Take 1,000 mg by mouth every 6 (six) hours as needed.   amoxicillin  500 MG capsule Commonly known as: AMOXIL  Take 500 mg by mouth as needed. TAKE 4 CAPSULES BY MOUTH ONE TO TWO HOURS PRIOR TO DENTAL WORK   aspirin  EC 81 MG tablet Take 81 mg by mouth daily. Swallow whole.   atorvastatin  10 MG tablet Commonly known as: LIPITOR Take 1 tablet (10 mg total) by mouth daily.   CALCIUM  600 + D PO Take 1 tablet by mouth daily.   cetirizine  10 MG tablet Commonly known as: ZYRTEC  Take 10 mg by mouth daily as needed for allergies.   clobetasol 0.05 % Gel Commonly known as: TEMOVATE Apply 1 application  topically 2 (two) times daily as needed (Unknown).   CoQ-10 200 MG Caps Take 200 mg by mouth daily at 6 (six) AM.   CULTURELLE ADULT ULT BALANCE PO Take 1 tablet by mouth daily.   desonide 0.05 % cream Commonly known as: DESOWEN Apply 1 Application topically daily as needed (Skin).   diclofenac Sodium 1 % Gel Commonly known as: VOLTAREN Apply 2 g topically daily as needed (pain).   ezetimibe  10  MG tablet Commonly known as: ZETIA  Take 1 tablet by mouth once daily   L-FORMULA LYSINE HCL PO Take 500 mg  by mouth daily.   levothyroxine  100 MCG tablet Commonly known as: SYNTHROID  Take 1 tablet (100 mcg total) by mouth daily.   metoprolol  succinate 25 MG 24 hr tablet Commonly known as: TOPROL -XL Take 1 tablet by mouth once daily   nitroGLYCERIN  0.4 MG SL tablet Commonly known as: NITROSTAT  PLACE 1 TABLET UNDER THE TONGUE AS NEEDED   ONE-A-DAY WOMENS 50+ ADVANTAGE PO Take by mouth daily.   predniSONE  10 MG tablet Commonly known as: DELTASONE  3 tabs by mouth daily x 3 days, then 2 tabs by mouth daily x 2 days then 1 tab by mouth daily x 2 days   Prolia  60 MG/ML Sosy injection Generic drug: denosumab  INJECT 60MG  INTO THE SKIN ONCE FOR 1 DOSE   REFRESH EYE ITCH RELIEF OP Place 1 drop into both eyes daily as needed (Dry eyes).   sodium chloride  0.65 % Soln nasal spray Commonly known as: OCEAN Place 1 spray into both nostrils daily as needed for congestion.   Valtrex  1000 MG tablet Generic drug: valACYclovir  Take 1,000 mg by mouth daily as needed (Cold Sores).   Vitafusion Fiber Well 2.5 g Chew Chew 1 tablet by mouth daily.          OBJECTIVE:   PHYSICAL EXAM: VS: BP 120/70   Pulse 75   Ht 5' 0.5 (1.537 m)   Wt 207 lb (93.9 kg)   SpO2 97%   BMI 39.76 kg/m    EXAM: General: Pt appears well and is in NAD  Neck: General: Supple without adenopathy. Thyroid :  No goiter or nodules appreciated.  Lungs: Clear with good BS bilat   Heart: Auscultation: RRR.  Extremities:  BL LE: No pretibial edema   Mental Status:  Mood and affect: No depression, anxiety, or agitation     DATA REVIEWED:   Latest Reference Range & Units 07/31/24 11:31  TSH 0.40 - 4.50 mIU/L 1.08  T4,Free(Direct) 0.8 - 1.8 ng/dL 1.8    ASSESSMENT / PLAN / RECOMMENDATIONS:   Hashimoto's Thyroiditis:    - Patient is contemplating starting oral GLP 1 agonist, I did advise the  patient that she could take it together with the levothyroxine , wait 30 minutes before eating and to notify our office to have an office visit and recheck her TFTs 2 months on the GLP-1 agonist to see if there is any interference with levothyroxine  absorption, so we can adjust her dose accordingly - TFTs remain within normal range, no change  Medications :  Continue levothyroxine  100 MCG daily   2. S/P left Lobectomy :   - S/P left lobectomy in 2009 secondary to Benign reasons - Per pt she has been getting annual ultrasounds, these records not available. - Repeat Ultrasound showed heterogenous gland without cysts or nodules  -   3.Dysphagia:  - This is minimal, she is concerned about thyroid  nodule.  Her ultrasound in 2022 showed heterogeneity of the right lobe with no cysts.  Thyroid  exam today does not show any goiter or nodules - I suspect this is esophageal in origin - Will defer to PCP if symptoms worsen    F/U in 1 yr    Signed electronically by: Stefano Redgie Butts, MD  [facility]  [facility] [address] [place], [place] [address] Phone: [phone] FAX: [phone]      CC: Avelina Greig BRAVO, MD [address] [place] [address] Phone: [phone]  Fax: [phone]   Return to Endocrinology clinic as below: Future  Appointments  Date Time Provider Department Center  12/29/2024  9:30 AM [facility] ANNUAL WELLNESS VISIT 1 [facility] [address]    "

## 2024-08-01 ENCOUNTER — Ambulatory Visit: Payer: Self-pay | Admitting: Internal Medicine

## 2024-08-01 DIAGNOSIS — R1310 Dysphagia, unspecified: Secondary | ICD-10-CM | POA: Insufficient documentation

## 2024-08-01 LAB — T4, FREE: Free T4: 1.8 ng/dL (ref 0.8–1.8)

## 2024-08-01 LAB — TSH: TSH: 1.08 m[IU]/L (ref 0.40–4.50)

## 2024-08-01 MED ORDER — LEVOTHYROXINE SODIUM 100 MCG PO TABS: 100.0000 ug | ORAL_TABLET | Freq: Every day | ORAL | 3 refills | Status: AC

## 2024-08-13 ENCOUNTER — Ambulatory Visit (INDEPENDENT_AMBULATORY_CARE_PROVIDER_SITE_OTHER): Admitting: Physician Assistant

## 2024-08-13 ENCOUNTER — Encounter (INDEPENDENT_AMBULATORY_CARE_PROVIDER_SITE_OTHER): Payer: Self-pay | Admitting: Physician Assistant

## 2024-08-13 VITALS — BP 122/73 | HR 85 | Temp 98.2°F | Ht 62.0 in | Wt 204.0 lb

## 2024-08-13 DIAGNOSIS — H9311 Tinnitus, right ear: Secondary | ICD-10-CM

## 2024-08-13 NOTE — Patient Instructions (Signed)
 [facility] Tinnitus  [address] [place], [place] [address] Phone: [phone] Fax: [phone] Forms: [email or web address]

## 2024-08-14 NOTE — Progress Notes (Signed)
 Dear Dr. Avelina, Here is my assessment for our mutual patient, Samantha Perkins. Thank you for allowing me the opportunity to care for your patient. Please do not hesitate to contact me should you have any other questions. Sincerely, Chyrl Cohen PA-C  Otolaryngology Clinic Note Referring provider: Dr. Avelina HPI:  Samantha Perkins is a 74 y.o. female kindly referred by Dr. Avelina   Discussed the use of AI scribe software for clinical note transcription with the patient, who gave verbal consent to proceed.  History of Present Illness    Samantha Perkins is a 74 year old female with bilateral sensorineural hearing loss and chronic allergic rhinitis who presents for evaluation of persistent right-sided tinnitus.  Persistent right-sided tinnitus began in mid-December and has remained constant since onset. The tinnitus is described as high-pitched, with intermittent fluctuations in loudness but never fully resolving. There is occasional transient involvement of the left ear. She notes no change in hearing and denies recent illness, trauma, or prior ear surgery. She experiences occasional mild aching discomfort in the right ear but no significant pain. She denies neurologic symptoms including numbness, tingling, weakness, or dizziness. She has not used any over-the-counter tinnitus remedies without physician approval.  She has bilateral hearing aids but has avoided wearing them recently due to increased sensitivity and discomfort in the right ear. She previously took prednisone  and used ear drops intermittently, without improvement in tinnitus. She is scheduled for a hearing test with her audiologist next week. At night, she uses a noise/vibration function on her adjustable bed to help mask the tinnitus.  Chronic allergy symptoms include persistent nasal congestion, postnasal drip, frequent throat clearing. She has trialed multiple allergy therapies including Benadryl, Zyrtec , Flonase, azelastine, and  saline irrigation, with limited benefit. She has undergone allergy testing and immunotherapy on two occasions, with minimal improvement. Environmental control measures such as air filtration and frequent linen washing have been maximized.           Independent Review of Additional Tests or Records:  none   PMH/Meds/All/SocHx/FamHx/ROS:   Past Medical History:  Diagnosis Date   Arthritis    Carotid artery occlusion    Complication of anesthesia    trouble waking up and her O2 goes low   Coronary artery disease    Distal LCX MI 2007 in [place]. PCI unable to be performed.    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    Hypertension    Hypothyroidism    Hashimoto's Thyroiditis   Myocardial infarction (HCC)    Pre-diabetes    Thyroid  disease      Past Surgical History:  Procedure Laterality Date   ABDOMINAL HYSTERECTOMY  1991   APPENDECTOMY  1977   BILATERAL OOPHORECTOMY  1991   CARDIAC CATHETERIZATION     CATARACT EXTRACTION, BILATERAL     GUM SURGERY     KNEE ARTHROSCOPY Right 11/08/2017   LASIK Bilateral    OVARIAN CYST REMOVAL     REVERSE SHOULDER ARTHROPLASTY Right 08/10/2023   Procedure: REVERSE SHOULDER ARTHROPLASTY;  Surgeon: Sharl Selinda Dover, MD;  Location: [facility];  Service: Orthopedics;  Laterality: Right;   ROTATOR CUFF REPAIR Right 1982   ROTATOR CUFF REPAIR W/ DISTAL CLAVICLE EXCISION Left 07/05/2018   THYROID  SURGERY  12/12/2007   TUBAL LIGATION     WRIST SURGERY Left     Family History  Problem Relation Age of Onset   Heart failure Mother    Hypothyroidism Mother    Aneurysm Mother  Stroke Mother    Mitral valve prolapse Mother    Lung cancer Mother        smoker   Arthritis Father    Heart disease Father    Heart murmur Father    Diabetes Father    Hypertension Father    COPD Father    Diabetes Sister    Bladder Cancer Sister    Cataracts Sister    Arthritis Sister    Osteopenia Sister    Irritable bowel syndrome Sister    Mitral  valve prolapse Sister    Diabetes Sister    Fibroids Sister    Lung cancer Sister        smoker   Mitral valve prolapse Sister    Hypertension Sister    Bradycardia Sister        w/ pacemaker   Crohn's disease Sister    Stroke Sister    Liver cancer Sister    Colon cancer Maternal Uncle        3 uncles    Pancreatic cancer Maternal Uncle        2 uncles with both colon and pancreatic   Diabetes Maternal Grandmother    Breast cancer Neg Hx    BRCA 1/2 Neg Hx      Social Connections: Moderately Integrated (06/03/2024)   Social Connection and Isolation Panel    Frequency of Communication with Friends and Family: More than three times a week    Frequency of Social Gatherings with Friends and Family: Once a week    Attends Religious Services: Never    Database Administrator or Organizations: Yes    Attends Engineer, Structural: 1 to 4 times per year    Marital Status: Married     Current Medications[1]   Physical Exam:   BP 122/73   Pulse 85   Temp 98.2 F (36.8 C)   Ht 5' 2 (1.575 m)   Wt 204 lb (92.5 kg)   SpO2 96%   BMI 37.31 kg/m   Pertinent Findings  CN II-XII grossly intact Bilateral EAC clear and TM intact with well pneumatized middle ear spaces  Anterior rhinoscopy: Septum midline; bilateral inferior turbinates with mild hypertrophy  No lesions of oral cavity/oropharynx; dentition WNL No obviously palpable neck masses/lymphadenopathy/thyromegaly No respiratory distress or stridor       Seprately Identifiable Procedures:  None  Impression & Plans:  Samantha Perkins is a 74 y.o. female with the following   Assessment and Plan    Tinnitus, right ear Persistent right-sided tinnitus since mid-December with no alarming features. No hearing change. Etiology unclear; most cases idiopathic. Hearing aids ineffective.   - Requested audiologic evaluation to assess hearing changes and rule out middle ear effusion or asymmetry. - Instructed her to provide  audiogram results for review. - Discussed use of background noise for symptomatic relief. - Recommended consultation with audiologist regarding hearing aid masking features for tinnitus. - Provided information about [facility] for tinnitus management if symptoms worsen. - Advised her to monitor for new symptoms such as neurologic changes or worsening hearing loss.  Allergic rhinitis Chronic nasal congestion, postnasal drip, throat clearing, and raspy voice consistent with allergic rhinitis. Maximal medical therapy and environmental control measures trialed without significant benefit. No evidence of chronic sinus disease or infection. No further interventions recommended. - Reviewed current and prior therapies including antihistamines, intranasal steroids, intranasal antihistamines, and saline irrigation. - Discussed prior allergy testing and immunotherapy, noting lack of significant benefit. -  Reinforced environmental control measures (air filters, frequent linen washing). - No new interventions recommended at this time.           - f/u Phone office visit with audiological evaluation    Thank you for allowing me the opportunity to care for your patient. Please do not hesitate to contact me should you have any other questions.  Sincerely, Chyrl Cohen PA-C [facility] Phone: [phone] Fax: [phone]  08/14/2024, 12:50 PM        [1]  Current Outpatient Medications:    acetaminophen  (TYLENOL ) 500 MG tablet, Take 1,000 mg by mouth every 6 (six) hours as needed., Disp: , Rfl:    amoxicillin  (AMOXIL ) 500 MG capsule, Take 500 mg by mouth as needed. TAKE 4 CAPSULES BY MOUTH ONE TO TWO HOURS PRIOR TO DENTAL WORK, Disp: , Rfl:    aspirin  EC 81 MG tablet, Take 81 mg by mouth daily. Swallow whole., Disp: , Rfl:    atorvastatin  (LIPITOR) 10 MG tablet, Take 1 tablet (10 mg total) by mouth daily., Disp: 90 tablet, Rfl: 3   Calcium  Carb-Cholecalciferol (CALCIUM   600 + D PO), Take 1 tablet by mouth daily., Disp: , Rfl:    cetirizine  (ZYRTEC ) 10 MG tablet, Take 10 mg by mouth daily as needed for allergies., Disp: , Rfl:    clobetasol (TEMOVATE) 0.05 % GEL, Apply 1 application  topically 2 (two) times daily as needed (Unknown)., Disp: , Rfl:    Coenzyme Q10 (COQ-10) 200 MG CAPS, Take 200 mg by mouth daily at 6 (six) AM., Disp: , Rfl:    desonide (DESOWEN) 0.05 % cream, Apply 1 Application topically daily as needed (Skin)., Disp: , Rfl:    diclofenac Sodium (VOLTAREN) 1 % GEL, Apply 2 g topically daily as needed (pain)., Disp: , Rfl:    Docosanol (ABREVA EX), Apply 1 Application topically 3 (three) times daily as needed (Fever blister)., Disp: , Rfl:    ezetimibe  (ZETIA ) 10 MG tablet, Take 1 tablet by mouth once daily, Disp: 90 tablet, Rfl: 3   Ketotifen Fumarate (REFRESH EYE ITCH RELIEF OP), Place 1 drop into both eyes daily as needed (Dry eyes)., Disp: , Rfl:    L-FORMULA LYSINE HCL PO, Take 500 mg by mouth daily., Disp: , Rfl:    Lactobacillus-Inulin (CULTURELLE ADULT ULT BALANCE PO), Take 1 tablet by mouth daily., Disp: , Rfl:    levothyroxine  (SYNTHROID ) 100 MCG tablet, Take 1 tablet (100 mcg total) by mouth daily., Disp: 90 tablet, Rfl: 3   metoprolol  succinate (TOPROL -XL) 25 MG 24 hr tablet, Take 1 tablet by mouth once daily, Disp: 90 tablet, Rfl: 3   Multiple Vitamins-Minerals (ONE-A-DAY WOMENS 50+ ADVANTAGE PO), Take by mouth daily., Disp: , Rfl:    nitroGLYCERIN  (NITROSTAT ) 0.4 MG SL tablet, PLACE 1 TABLET UNDER THE TONGUE AS NEEDED, Disp: 25 tablet, Rfl: 11   predniSONE  (DELTASONE ) 10 MG tablet, 3 tabs by mouth daily x 3 days, then 2 tabs by mouth daily x 2 days then 1 tab by mouth daily x 2 days, Disp: 15 tablet, Rfl: 0   PROLIA  60 MG/ML SOSY injection, INJECT 60MG  INTO THE SKIN ONCE FOR 1 DOSE, Disp: 1 mL, Rfl: 0   sodium chloride  (OCEAN) 0.65 % SOLN nasal spray, Place 1 spray into both nostrils daily as needed for congestion., Disp: , Rfl:     valACYclovir  (VALTREX ) 1000 MG tablet, Take 1,000 mg by mouth daily as needed (Cold Sores)., Disp: , Rfl:    Vitafusion Fiber  Well 2.5 g CHEW, Chew 1 tablet by mouth daily., Disp: , Rfl:   Current Facility-Administered Medications:    denosumab  (PROLIA ) injection 60 mg, 60 mg, Subcutaneous, Once, Bedsole, Amy E, MD   denosumab  (PROLIA ) injection 60 mg, 60 mg, Subcutaneous, Once, Bedsole, Amy E, MD   [START ON 11/30/2024] denosumab  (PROLIA ) injection 60 mg, 60 mg, Subcutaneous, Q6 months, Bedsole, Amy E, MD

## 2024-12-29 ENCOUNTER — Ambulatory Visit

## 2024-12-30 ENCOUNTER — Ambulatory Visit

## 2025-07-27 ENCOUNTER — Ambulatory Visit: Admitting: Internal Medicine
